# Patient Record
Sex: Male | Born: 1975 | Race: White | Hispanic: No | Marital: Single | Smoking: Current some day smoker
Health system: Southern US, Community
[De-identification: ages and names within clinical notes are randomized; demographics above are authoritative.]

## PROBLEM LIST (undated history)

## (undated) ENCOUNTER — Ambulatory Visit (HOSPITAL_COMMUNITY): Admission: EM | Payer: BLUE CROSS/BLUE SHIELD | Source: Home / Self Care

## (undated) DIAGNOSIS — F32A Depression, unspecified: Secondary | ICD-10-CM

## (undated) DIAGNOSIS — G459 Transient cerebral ischemic attack, unspecified: Secondary | ICD-10-CM

## (undated) DIAGNOSIS — G454 Transient global amnesia: Secondary | ICD-10-CM

## (undated) DIAGNOSIS — F419 Anxiety disorder, unspecified: Secondary | ICD-10-CM

## (undated) DIAGNOSIS — F410 Panic disorder [episodic paroxysmal anxiety] without agoraphobia: Secondary | ICD-10-CM

---

## 2005-03-27 ENCOUNTER — Emergency Department (HOSPITAL_COMMUNITY): Admission: EM | Admit: 2005-03-27 | Discharge: 2005-03-27 | Payer: Self-pay | Admitting: Emergency Medicine

## 2005-06-19 ENCOUNTER — Emergency Department (HOSPITAL_COMMUNITY): Admission: EM | Admit: 2005-06-19 | Discharge: 2005-06-19 | Payer: Self-pay | Admitting: Emergency Medicine

## 2005-08-23 ENCOUNTER — Emergency Department (HOSPITAL_COMMUNITY): Admission: EM | Admit: 2005-08-23 | Discharge: 2005-08-23 | Payer: Self-pay | Admitting: Emergency Medicine

## 2005-10-22 ENCOUNTER — Emergency Department (HOSPITAL_COMMUNITY): Admission: EM | Admit: 2005-10-22 | Discharge: 2005-10-22 | Payer: Self-pay | Admitting: Emergency Medicine

## 2005-11-09 ENCOUNTER — Emergency Department (HOSPITAL_COMMUNITY): Admission: EM | Admit: 2005-11-09 | Discharge: 2005-11-09 | Payer: Self-pay | Admitting: Emergency Medicine

## 2006-09-16 ENCOUNTER — Emergency Department (HOSPITAL_COMMUNITY): Admission: EM | Admit: 2006-09-16 | Discharge: 2006-09-16 | Payer: Self-pay | Admitting: Emergency Medicine

## 2013-09-17 ENCOUNTER — Encounter (HOSPITAL_COMMUNITY): Payer: Self-pay | Admitting: Emergency Medicine

## 2013-09-17 ENCOUNTER — Emergency Department (HOSPITAL_COMMUNITY)
Admission: EM | Admit: 2013-09-17 | Discharge: 2013-09-17 | Disposition: A | Discharge status: Home / Self Care | Payer: Self-pay | Attending: Emergency Medicine | Admitting: Emergency Medicine

## 2013-09-17 DIAGNOSIS — N342 Other urethritis: Secondary | ICD-10-CM | POA: Insufficient documentation

## 2013-09-17 DIAGNOSIS — Z79899 Other long term (current) drug therapy: Secondary | ICD-10-CM | POA: Insufficient documentation

## 2013-09-17 DIAGNOSIS — F17200 Nicotine dependence, unspecified, uncomplicated: Secondary | ICD-10-CM | POA: Insufficient documentation

## 2013-09-17 DIAGNOSIS — R369 Urethral discharge, unspecified: Secondary | ICD-10-CM | POA: Insufficient documentation

## 2013-09-17 DIAGNOSIS — Z8673 Personal history of transient ischemic attack (TIA), and cerebral infarction without residual deficits: Secondary | ICD-10-CM | POA: Insufficient documentation

## 2013-09-17 DIAGNOSIS — F411 Generalized anxiety disorder: Secondary | ICD-10-CM | POA: Insufficient documentation

## 2013-09-17 HISTORY — DX: Anxiety disorder, unspecified: F41.9

## 2013-09-17 HISTORY — DX: Transient cerebral ischemic attack, unspecified: G45.9

## 2013-09-17 LAB — URINE MICROSCOPIC-ADD ON

## 2013-09-17 LAB — URINALYSIS, ROUTINE W REFLEX MICROSCOPIC
BILIRUBIN URINE: NEGATIVE
GLUCOSE, UA: NEGATIVE mg/dL
Hgb urine dipstick: NEGATIVE
KETONES UR: NEGATIVE mg/dL
Nitrite: NEGATIVE
PROTEIN: NEGATIVE mg/dL
Specific Gravity, Urine: 1.006 (ref 1.005–1.030)
Urobilinogen, UA: 0.2 mg/dL (ref 0.0–1.0)
pH: 6 (ref 5.0–8.0)

## 2013-09-17 MED ORDER — CEFTRIAXONE SODIUM 250 MG IJ SOLR
250.0000 mg | Freq: Once | INTRAMUSCULAR | Status: AC
  Administered 2013-09-17: 250 mg via INTRAMUSCULAR
  Filled 2013-09-17: qty 250

## 2013-09-17 MED ORDER — CIPROFLOXACIN HCL 500 MG PO TABS: 500.0000 mg | ORAL_TABLET | Freq: Two times a day (BID) | ORAL | Status: DC

## 2013-09-17 MED ORDER — STERILE WATER FOR INJECTION IJ SOLN
INTRAMUSCULAR | Status: AC
  Administered 2013-09-17: 10 mL
  Filled 2013-09-17: qty 10

## 2013-09-17 MED ORDER — AZITHROMYCIN 250 MG PO TABS
1000.0000 mg | ORAL_TABLET | Freq: Once | ORAL | Status: AC
  Administered 2013-09-17: 1000 mg via ORAL
  Filled 2013-09-17: qty 4

## 2013-09-17 MED ORDER — METRONIDAZOLE 500 MG PO TABS
2000.0000 mg | ORAL_TABLET | Freq: Once | ORAL | Status: AC
  Administered 2013-09-17: 2000 mg via ORAL
  Filled 2013-09-17: qty 4

## 2013-09-17 NOTE — ED Provider Notes (Signed)
CSN: [id number]     Arrival date & time 09/17/13  [id number] History   First MD Initiated Contact with Patient 09/17/13 0913     Chief Complaint  Patient presents with  . Penile Discharge     (Consider location/radiation/quality/duration/timing/severity/associated sxs/prior Treatment) HPI Michael [name] is a 38 y.o. male who presents to emergency department complaining of a yellow penile discharge. Patient states he has had discharge for approximately a week. He denies any associated pain. He denies any dysuria, abdominal pain, scrotal pain or swelling. He denies any fever, chills, malaise. He states he is married but separated and has had a new partner for the last 2 months. He denies any perineal lesions or sores. No treatment tried. Nothing makes symptoms better or worse. States uses condoms but not every time.   Past Medical History  Diagnosis Date  . TIA (transient ischemic attack)   . Anxiety    History reviewed. No pertinent past surgical history. History reviewed. No pertinent family history. History  Substance Use Topics  . Smoking status: Current Every Day Smoker -- 1.00 packs/day    Types: Cigarettes  . Smokeless tobacco: Not on file  . Alcohol Use: Yes     Comment: rarely    Review of Systems  Constitutional: Negative for fever and chills.  Respiratory: Negative for cough, chest tightness and shortness of breath.   Cardiovascular: Negative for chest pain, palpitations and leg swelling.  Gastrointestinal: Negative for nausea, vomiting, abdominal pain, diarrhea and abdominal distention.  Genitourinary: Positive for discharge. Negative for dysuria, frequency, hematuria, genital sores and penile pain.  Musculoskeletal: Negative for arthralgias, myalgias, neck pain and neck stiffness.  Skin: Negative for rash.  Allergic/Immunologic: Negative for immunocompromised state.  Neurological: Negative for dizziness, weakness, light-headedness, numbness and headaches.       Allergies  Review of patient's allergies indicates no known allergies.  Home Medications   Prior to Admission medications   Medication Sig Start Date End Date Taking? Authorizing Provider  ALPRAZolam Prudy Feeler(XANAX) 1 MG tablet Take 1 mg by mouth 6 (six) times daily.   Yes Historical Provider, MD  Aspirin-Acetaminophen-Caffeine (EXCEDRIN PO) Take 4 tablets by mouth 4 (four) times daily as needed (for pain).   Yes Historical Provider, MD  Desvenlafaxine ER (KHEDEZLA) 50 MG TB24 Take 50 mg by mouth at bedtime.   Yes Historical Provider, MD  imipramine (TOFRANIL) 50 MG tablet Take 50 mg by mouth 3 (three) times daily.   Yes Historical Provider, MD   BP 127/83  Pulse 117  Temp(Src) 98.4 F (36.9 C) (Oral)  Resp 18  Ht 6\' 2"  (1.88 m)  Wt 210 lb (95.255 kg)  BMI 26.95 kg/m2  SpO2 100% Physical Exam  Nursing note and vitals reviewed. Constitutional: He is oriented to person, place, and time. He appears well-developed and well-nourished. No distress.  HENT:  Head: Normocephalic and atraumatic.  Eyes: Conjunctivae are normal.  Neck: Neck supple.  Cardiovascular: Normal rate, regular rhythm and normal heart sounds.   Pulmonary/Chest: Effort normal. No respiratory distress. He has no wheezes. He has no rales.  Abdominal: Soft. Bowel sounds are normal. He exhibits no distension. There is no tenderness. There is no rebound.  Genitourinary: Testes normal. Circumcised. No penile tenderness. Discharge found.  Yellow penile discharge present  Musculoskeletal: He exhibits no edema.  Neurological: He is alert and oriented to person, place, and time.  Skin: Skin is warm and dry.    ED Course  Procedures (including critical care time) Labs Review  Labs Reviewed  URINALYSIS, ROUTINE W REFLEX MICROSCOPIC - Abnormal; Notable for the following:    APPearance CLOUDY (*)    Leukocytes, UA LARGE (*)    All other components within normal limits  GC/CHLAMYDIA PROBE AMP  URINE MICROSCOPIC-ADD ON     Imaging Review No results found.   EKG Interpretation None      MDM   Final diagnoses:  Urethritis    Yellow penile discharge present on exam. New sexual partner. Treated in ED for what sounds like most likely STI, covered with 250mg  rocephin IM, zithromax 1g PO, flagyl 2g PO. Home with cipro, UA infected. Follow up with pcp. Explained cultures are pending.   Filed Vitals:   09/17/13 0907  BP: 127/83  Pulse: 117  Temp: 98.4 F (36.9 C)  TempSrc: Oral  Resp: 18  Height: 6\' 2"  (1.88 m)  Weight: 210 lb (95.255 kg)  SpO2: 100%       Abbigaile Rockman A Jaskirat Schwieger, PA-C 09/17/13 1038

## 2013-09-17 NOTE — Discharge Instructions (Signed)
cipro as prescribed until all gone. No intercourse for 5 days. When cultures return, if they are abnormal, you will be called. Follow up with your doctor as needed.    Urethritis Urethritis is an inflammation of the tube through which urine exits your bladder (urethra).  CAUSES Urethritis is often caused by an infection in your urethra. The infection can be viral, like herpes. The infection can also be bacterial, like gonorrhea. RISK FACTORS Risk factors of urethritis include:  Having sex without using a condom.  Having multiple sexual partners.  Having poor hygiene. SIGNS AND SYMPTOMS Symptoms of urethritis are less noticeable in women than in men. These symptoms include:  Burning feeling when you urinate (dysuria).  Discharge from your urethra.  Blood in your urine (hematuria).  Urinating more than usual. DIAGNOSIS  To confirm a diagnosis of urethritis, your health care provider will do the following:  Ask about your sexual history.  Perform a physical exam.  Have you provide a sample of your urine for lab testing.  Use a cotton swab to gently collect a sample from your urethra for lab testing. TREATMENT  It is important to treat urethritis. Depending on the cause, untreated urethritis may lead to serious genital infections and possibly infertility. Urethritis caused by a bacterial infection is treated with antibiotic medicine. All sexual partners must be treated.  HOME CARE INSTRUCTIONS  Do not have sex until the test results are known and treatment is completed, even if your symptoms go away before you finish treatment.  If you were prescribed an antibiotic, finish it all even if you start to feel better. SEEK MEDICAL CARE IF:   Your symptoms are not improved in 3 days.  Your symptoms are getting worse.  You develop abdominal pain or pelvic pain (in women).  You develop joint pain.  You have a fever. SEEK IMMEDIATE MEDICAL CARE IF:   You have severe pain in  the belly, back, or side.  You have repeated vomiting. MAKE SURE YOU:  Understand these instructions.  Will watch your condition.  Will get help right away if you are not doing well or get worse. Document Released: 07/21/2000 Document Revised: 06/11/2013 Document Reviewed: 09/25/2012 [facility] Patient Information 2015 [place], [place]. This information is not intended to replace advice given to you by your health care provider. Make sure you discuss any questions you have with your health care provider.

## 2013-09-17 NOTE — ED Provider Notes (Signed)
Medical screening examination/treatment/procedure(s) were performed by non-physician practitioner and as supervising physician I was immediately available for consultation/collaboration.   EKG Interpretation None       Yvonne Petite N Sefora Tietje, DO 09/17/13 1558 

## 2013-09-17 NOTE — ED Notes (Signed)
Pt with penile discharge for 1 week.  Pt is married.  Does not use condoms.

## 2013-09-18 NOTE — ED Notes (Signed)
Pharmacy needed provider verification of script written

## 2013-09-19 LAB — GC/CHLAMYDIA PROBE AMP
CT Probe RNA: NEGATIVE
GC Probe RNA: NEGATIVE

## 2014-02-20 ENCOUNTER — Encounter (HOSPITAL_COMMUNITY): Payer: Self-pay | Admitting: Emergency Medicine

## 2014-02-20 ENCOUNTER — Emergency Department (HOSPITAL_COMMUNITY)
Admission: EM | Admit: 2014-02-20 | Discharge: 2014-02-20 | Disposition: A | Discharge status: Home / Self Care | Payer: Self-pay | Attending: Emergency Medicine | Admitting: Emergency Medicine

## 2014-02-20 DIAGNOSIS — Y9389 Activity, other specified: Secondary | ICD-10-CM | POA: Insufficient documentation

## 2014-02-20 DIAGNOSIS — Y9289 Other specified places as the place of occurrence of the external cause: Secondary | ICD-10-CM | POA: Insufficient documentation

## 2014-02-20 DIAGNOSIS — G43809 Other migraine, not intractable, without status migrainosus: Secondary | ICD-10-CM | POA: Insufficient documentation

## 2014-02-20 DIAGNOSIS — F419 Anxiety disorder, unspecified: Secondary | ICD-10-CM | POA: Insufficient documentation

## 2014-02-20 DIAGNOSIS — S199XXA Unspecified injury of neck, initial encounter: Secondary | ICD-10-CM | POA: Insufficient documentation

## 2014-02-20 DIAGNOSIS — Z7982 Long term (current) use of aspirin: Secondary | ICD-10-CM | POA: Insufficient documentation

## 2014-02-20 DIAGNOSIS — S46912A Strain of unspecified muscle, fascia and tendon at shoulder and upper arm level, left arm, initial encounter: Secondary | ICD-10-CM | POA: Insufficient documentation

## 2014-02-20 DIAGNOSIS — Z720 Tobacco use: Secondary | ICD-10-CM | POA: Insufficient documentation

## 2014-02-20 DIAGNOSIS — X58XXXA Exposure to other specified factors, initial encounter: Secondary | ICD-10-CM | POA: Insufficient documentation

## 2014-02-20 DIAGNOSIS — Z79899 Other long term (current) drug therapy: Secondary | ICD-10-CM | POA: Insufficient documentation

## 2014-02-20 DIAGNOSIS — Y998 Other external cause status: Secondary | ICD-10-CM | POA: Insufficient documentation

## 2014-02-20 DIAGNOSIS — Z8673 Personal history of transient ischemic attack (TIA), and cerebral infarction without residual deficits: Secondary | ICD-10-CM | POA: Insufficient documentation

## 2014-02-20 MED ORDER — CYCLOBENZAPRINE HCL 10 MG PO TABS: 10.0000 mg | ORAL_TABLET | Freq: Two times a day (BID) | ORAL | Status: DC | PRN

## 2014-02-20 MED ORDER — NAPROXEN 375 MG PO TABS: 375.0000 mg | ORAL_TABLET | Freq: Two times a day (BID) | ORAL | Status: DC

## 2014-02-20 MED ORDER — HYDROCODONE-ACETAMINOPHEN 5-325 MG PO TABS: 1.0000 | ORAL_TABLET | ORAL | Status: DC | PRN

## 2014-02-20 MED ORDER — DIPHENHYDRAMINE HCL 50 MG/ML IJ SOLN
12.5000 mg | Freq: Once | INTRAMUSCULAR | Status: AC
  Administered 2014-02-20: 12.5 mg via INTRAVENOUS
  Filled 2014-02-20: qty 1

## 2014-02-20 MED ORDER — SODIUM CHLORIDE 0.9 % IV BOLUS (SEPSIS)
1000.0000 mL | Freq: Once | INTRAVENOUS | Status: AC
  Administered 2014-02-20: 1000 mL via INTRAVENOUS

## 2014-02-20 MED ORDER — KETOROLAC TROMETHAMINE 30 MG/ML IJ SOLN
30.0000 mg | Freq: Once | INTRAMUSCULAR | Status: AC
  Administered 2014-02-20: 30 mg via INTRAVENOUS
  Filled 2014-02-20: qty 1

## 2014-02-20 MED ORDER — METOCLOPRAMIDE HCL 5 MG/ML IJ SOLN
10.0000 mg | Freq: Once | INTRAMUSCULAR | Status: AC
  Administered 2014-02-20: 10 mg via INTRAVENOUS
  Filled 2014-02-20: qty 2

## 2014-02-20 MED ORDER — KETOROLAC TROMETHAMINE 10 MG PO TABS
10.0000 mg | ORAL_TABLET | Freq: Once | ORAL | Status: DC
  Filled 2014-02-20: qty 1

## 2014-02-20 MED ORDER — DEXAMETHASONE SODIUM PHOSPHATE 10 MG/ML IJ SOLN
10.0000 mg | Freq: Once | INTRAMUSCULAR | Status: AC
  Administered 2014-02-20: 10 mg via INTRAVENOUS
  Filled 2014-02-20: qty 1

## 2014-02-20 NOTE — ED Notes (Signed)
Pt c/o neck and left shoulder pain, had injury about 2.5 weeks ago, has not gotten better. States that it is causing his migraine to worsen. Pt c/o n/v with migraines.

## 2014-02-20 NOTE — Discharge Instructions (Signed)
Muscle Strain A muscle strain is an injury that occurs when a muscle is stretched beyond its normal length. Usually a small number of muscle fibers are torn when this happens. Muscle strain is rated in degrees. First-degree strains have the least amount of muscle fiber tearing and pain. Second-degree and third-degree strains have increasingly more tearing and pain.  Usually, recovery from muscle strain takes 1-2 weeks. Complete healing takes 5-6 weeks.  CAUSES  Muscle strain happens when a sudden, violent force placed on a muscle stretches it too far. This may occur with lifting, sports, or a fall.  RISK FACTORS Muscle strain is especially common in athletes.  SIGNS AND SYMPTOMS At the site of the muscle strain, there may be:  Pain.  Bruising.  Swelling.  Difficulty using the muscle due to pain or lack of normal function. DIAGNOSIS  Your health care provider will perform a physical exam and ask about your medical history. TREATMENT  Often, the best treatment for a muscle strain is resting, icing, and applying cold compresses to the injured area.  HOME CARE INSTRUCTIONS   Use the PRICE method of treatment to promote muscle healing during the first 2-3 days after your injury. The PRICE method involves:  Protecting the muscle from being injured again.  Restricting your activity and resting the injured body part.  Icing your injury. To do this, put ice in a plastic bag. Place a towel between your skin and the bag. Then, apply the ice and leave it on from 15-20 minutes each hour. After the third day, switch to moist heat packs.  Apply compression to the injured area with a splint or elastic bandage. Be careful not to wrap it too tightly. This may interfere with blood circulation or increase swelling.  Elevate the injured body part above the level of your heart as often as you can.  Only take over-the-counter or prescription medicines for pain, discomfort, or fever as directed by your  health care provider.  Warming up prior to exercise helps to prevent future muscle strains. SEEK MEDICAL CARE IF:   You have increasing pain or swelling in the injured area.  You have numbness, tingling, or a significant loss of strength in the injured area. MAKE SURE YOU:   Understand these instructions.  Will watch your condition.  Will get help right away if you are not doing well or get worse. Document Released: 01/25/2005 Document Revised: 11/15/2012 Document Reviewed: 08/24/2012 [facility] Patient Information 2015 [place], [place]. This information is not intended to replace advice given to you by your health care provider. Make sure you discuss any questions you have with your health care provider.  Migraine Headache A migraine headache is an intense, throbbing pain on one or both sides of your head. A migraine can last for 30 minutes to several hours. CAUSES  The exact cause of a migraine headache is not always known. However, a migraine may be caused when nerves in the brain become irritated and release chemicals that cause inflammation. This causes pain. Certain things may also trigger migraines, such as:  Alcohol.  Smoking.  Stress.  Menstruation.  Aged cheeses.  Foods or drinks that contain nitrates, glutamate, aspartame, or tyramine.  Lack of sleep.  Chocolate.  Caffeine.  Hunger.  Physical exertion.  Fatigue.  Medicines used to treat chest pain (nitroglycerine), birth control pills, estrogen, and some blood pressure medicines. SIGNS AND SYMPTOMS  Pain on one or both sides of your head.  Pulsating or throbbing pain.  Severe  pain that prevents daily activities.  Pain that is aggravated by any physical activity.  Nausea, vomiting, or both.  Dizziness.  Pain with exposure to bright lights, loud noises, or activity.  General sensitivity to bright lights, loud noises, or smells. Before you get a migraine, you may get warning signs that a migraine  is coming (aura). An aura may include:  Seeing flashing lights.  Seeing bright spots, halos, or zigzag lines.  Having tunnel vision or blurred vision.  Having feelings of numbness or tingling.  Having trouble talking.  Having muscle weakness. DIAGNOSIS  A migraine headache is often diagnosed based on:  Symptoms.  Physical exam.  A CT scan or MRI of your head. These imaging tests cannot diagnose migraines, but they can help rule out other causes of headaches. TREATMENT Medicines may be given for pain and nausea. Medicines can also be given to help prevent recurrent migraines.  HOME CARE INSTRUCTIONS  Only take over-the-counter or prescription medicines for pain or discomfort as directed by your health care provider. The use of long-term narcotics is not recommended.  Lie down in a dark, quiet room when you have a migraine.  Keep a journal to find out what may trigger your migraine headaches. For example, write down:  What you eat and drink.  How much sleep you get.  Any change to your diet or medicines.  Limit alcohol consumption.  Quit smoking if you smoke.  Get 7-9 hours of sleep, or as recommended by your health care provider.  Limit stress.  Keep lights dim if bright lights bother you and make your migraines worse. SEEK IMMEDIATE MEDICAL CARE IF:   Your migraine becomes severe.  You have a fever.  You have a stiff neck.  You have vision loss.  You have muscular weakness or loss of muscle control.  You start losing your balance or have trouble walking.  You feel faint or pass out.  You have severe symptoms that are different from your first symptoms. MAKE SURE YOU:   Understand these instructions.  Will watch your condition.  Will get help right away if you are not doing well or get worse. Document Released: 01/25/2005 Document Revised: 06/11/2013 Document Reviewed: 10/02/2012 [facility] Patient Information 2015 [place], [place]. This information  is not intended to replace advice given to you by your health care provider. Make sure you discuss any questions you have with your health care provider.

## 2014-02-20 NOTE — ED Provider Notes (Signed)
CSN: [id number]     Arrival date & time 02/20/14  1554 History   First MD Initiated Contact with Patient 02/20/14 1812     Chief Complaint  Patient presents with  . Shoulder Pain  . Neck Pain  . Migraine     (Consider location/radiation/quality/duration/timing/severity/associated sxs/prior Treatment) HPI    PCP: No primary care provider on file. Blood pressure 135/88, pulse 118, temperature 98.4 F (36.9 C), temperature source Oral, resp. rate 20, SpO2 100 %.  Illa LevelJohn R Guercio is a 39 y.o.male with a significant PMH of anxiety and TIA presents to the ER with complaints of migraine and neck/shoulder injury. Two half weeks ago he was raising a pallet when he felt the muscle in his left shoulder pool. Since then he has been taking oral ibuprofen and Excedrin for his symptoms. These medications help but his shoulder pain comes back afterwards. He has history of daily migraines for which she normally sees a neurologist and has not been to see the neurologist months due to insurance problems. He reports the migrainous starting at the base of the skull and radiating up towards his frontal scalp. He's not had any nausea, vomiting, diarrhea, global or focal weakness and he denies change of vision.    Past Medical History  Diagnosis Date  . TIA (transient ischemic attack)   . Anxiety    History reviewed. No pertinent past surgical history. No family history on file. History  Substance Use Topics  . Smoking status: Current Every Day Smoker -- 1.00 packs/day    Types: Cigarettes  . Smokeless tobacco: Not on file  . Alcohol Use: Yes     Comment: rarely    Review of Systems  10 Systems reviewed and are negative for acute change except as noted in the HPI.    Allergies  Review of patient's allergies indicates no known allergies.  Home Medications   Prior to Admission medications   Medication Sig Start Date End Date Taking? Authorizing Provider  ALPRAZolam Prudy Feeler(XANAX) 1 MG tablet Take 1  mg by mouth 3 (three) times daily as needed for anxiety (anxiety).    Yes Historical Provider, MD  Aspirin-Acetaminophen-Caffeine (PAIN RELIEVER PLUS PO) Take 4 tablets by mouth daily as needed (head & shoulder pain).   Yes Historical Provider, MD  ibuprofen (ADVIL,MOTRIN) 200 MG tablet Take 800 mg by mouth every 6 (six) hours as needed for moderate pain (head & shoulder pain).   Yes Historical Provider, MD  ciprofloxacin (CIPRO) 500 MG tablet Take 1 tablet (500 mg total) by mouth 2 (two) times daily. Patient not taking: Reported on 02/20/2014 09/17/13   Tatyana A Kirichenko, PA-C  cyclobenzaprine (FLEXERIL) 10 MG tablet Take 1 tablet (10 mg total) by mouth 2 (two) times daily as needed for muscle spasms. 02/20/14   Aden Youngman Irine SealG Jameil Whitmoyer, PA-C  HYDROcodone-acetaminophen (NORCO/VICODIN) 5-325 MG per tablet Take 1-2 tablets by mouth every 4 (four) hours as needed for moderate pain or severe pain. 02/20/14   Yuval Nolet Irine SealG Irene Mitcham, PA-C  naproxen (NAPROSYN) 375 MG tablet Take 1 tablet (375 mg total) by mouth 2 (two) times daily. 02/20/14   Anayely Constantine Irine SealG Terre Zabriskie, PA-C   BP 135/88 mmHg  Pulse 118  Temp(Src) 98.4 F (36.9 C) (Oral)  Resp 20  SpO2 100% Physical Exam  Constitutional: He appears well-developed and well-nourished. No distress.  HENT:  Head: Normocephalic and atraumatic.  Eyes: Pupils are equal, round, and reactive to light.  Neck: Normal range of motion. Neck supple.  Cardiovascular: Normal  rate and regular rhythm.   Pulmonary/Chest: Effort normal.  Abdominal: Soft.  Musculoskeletal:       Left shoulder: He exhibits tenderness, pain and spasm. He exhibits normal range of motion, no bony tenderness, no swelling, no effusion, no crepitus, no deformity, no laceration, normal pulse and normal strength.       Arms: Neurological: He is alert.  Cranial nerves II-VIII and X-XII evaluated and show no deficits. Pt alert and oriented x 3 Upper and lower extremity strength is symmetrical and physiologic Normal  muscular tone No facial droop Coordination intact, no limb ataxia, finger-nose-finger normal Rapid alternating movements normal No pronator drift  Skin: Skin is warm and dry.  Nursing note and vitals reviewed.   ED Course  Procedures (including critical care time) Labs Review Labs Reviewed - No data to display  Imaging Review No results found.   EKG Interpretation None      MDM   Final diagnoses:  Other migraine without status migrainosus, not intractable  Shoulder strain, left, initial encounter    Medications  ketorolac (TORADOL) 30 MG/ML injection 30 mg (not administered)  dexamethasone (DECADRON) injection 10 mg (10 mg Intravenous Given 02/20/14 1856)  sodium chloride 0.9 % bolus 1,000 mL (0 mLs Intravenous Stopped 02/20/14 1931)  metoCLOPramide (REGLAN) injection 10 mg (10 mg Intravenous Given 02/20/14 1855)  diphenhydrAMINE (BENADRYL) injection 12.5 mg (12.5 mg Intravenous Given 02/20/14 1904)    Patients migraine and shoulder pain has improved to 1-2/10.  The patient denies any symptoms of neurological impairment or TIA's; no amaurosis, diplopia, dysphasia, or unilateral disturbance of motor or sensory function. No loss of balance or vertigo.  Rx: for shoulder strain: muscle relaxer, Nsaids, Vicodin (x 12)  38 y.o.Aram Candela Bircher's evaluation in the Emergency Department is complete. It has been determined that no acute conditions requiring further emergency intervention are present at this time. The patient/guardian have been advised of the diagnosis and plan. We have discussed signs and symptoms that warrant return to the ED, such as changes or worsening in symptoms.  Vital signs are stable at discharge. Filed Vitals:   02/20/14 1558  BP: 135/88  Pulse: 118  Temp: 98.4 F (36.9 C)  Resp: 20    Patient/guardian has voiced understanding and agreed to follow-up with the PCP or specialist.    Dorthula Matas, PA-C 02/20/14 2004  Tilden Fossa,  MD 02/20/14 2008

## 2014-03-11 ENCOUNTER — Encounter (HOSPITAL_COMMUNITY): Payer: Self-pay | Admitting: Emergency Medicine

## 2014-03-11 ENCOUNTER — Emergency Department (HOSPITAL_COMMUNITY): Payer: Self-pay

## 2014-03-11 ENCOUNTER — Emergency Department (HOSPITAL_COMMUNITY)
Admission: EM | Admit: 2014-03-11 | Discharge: 2014-03-11 | Disposition: A | Discharge status: Home / Self Care | Payer: Self-pay | Attending: Emergency Medicine | Admitting: Emergency Medicine

## 2014-03-11 DIAGNOSIS — Z8673 Personal history of transient ischemic attack (TIA), and cerebral infarction without residual deficits: Secondary | ICD-10-CM | POA: Insufficient documentation

## 2014-03-11 DIAGNOSIS — F419 Anxiety disorder, unspecified: Secondary | ICD-10-CM | POA: Insufficient documentation

## 2014-03-11 DIAGNOSIS — Z720 Tobacco use: Secondary | ICD-10-CM | POA: Insufficient documentation

## 2014-03-11 DIAGNOSIS — Z79899 Other long term (current) drug therapy: Secondary | ICD-10-CM | POA: Insufficient documentation

## 2014-03-11 DIAGNOSIS — Z791 Long term (current) use of non-steroidal anti-inflammatories (NSAID): Secondary | ICD-10-CM | POA: Insufficient documentation

## 2014-03-11 DIAGNOSIS — H539 Unspecified visual disturbance: Secondary | ICD-10-CM | POA: Insufficient documentation

## 2014-03-11 DIAGNOSIS — Z792 Long term (current) use of antibiotics: Secondary | ICD-10-CM | POA: Insufficient documentation

## 2014-03-11 DIAGNOSIS — R200 Anesthesia of skin: Secondary | ICD-10-CM | POA: Insufficient documentation

## 2014-03-11 DIAGNOSIS — G43909 Migraine, unspecified, not intractable, without status migrainosus: Secondary | ICD-10-CM | POA: Insufficient documentation

## 2014-03-11 LAB — CBC
HCT: 39 % (ref 39.0–52.0)
HEMOGLOBIN: 12.9 g/dL — AB (ref 13.0–17.0)
MCH: 31.5 pg (ref 26.0–34.0)
MCHC: 33.1 g/dL (ref 30.0–36.0)
MCV: 95.1 fL (ref 78.0–100.0)
PLATELETS: 298 10*3/uL (ref 150–400)
RBC: 4.1 MIL/uL — ABNORMAL LOW (ref 4.22–5.81)
RDW: 12.9 % (ref 11.5–15.5)
WBC: 10.6 10*3/uL — ABNORMAL HIGH (ref 4.0–10.5)

## 2014-03-11 LAB — COMPREHENSIVE METABOLIC PANEL
ALBUMIN: 4.4 g/dL (ref 3.5–5.2)
ALT: 11 U/L (ref 0–53)
AST: 15 U/L (ref 0–37)
Alkaline Phosphatase: 59 U/L (ref 39–117)
Anion gap: 7 (ref 5–15)
BILIRUBIN TOTAL: 0.6 mg/dL (ref 0.3–1.2)
BUN: 16 mg/dL (ref 6–23)
CALCIUM: 8.9 mg/dL (ref 8.4–10.5)
CO2: 24 mmol/L (ref 19–32)
CREATININE: 0.95 mg/dL (ref 0.50–1.35)
Chloride: 106 mmol/L (ref 96–112)
GFR calc Af Amer: >90 mL/min (ref >90)
GFR calc non Af Amer: >90 mL/min (ref >90)
GLUCOSE: 99 mg/dL (ref 70–99)
POTASSIUM: 3.4 mmol/L — AB (ref 3.5–5.1)
Sodium: 137 mmol/L (ref 135–145)
Total Protein: 7.5 g/dL (ref 6.0–8.3)

## 2014-03-11 LAB — PROTIME-INR
INR: 0.95 (ref 0.00–1.49)
Prothrombin Time: 12.8 seconds (ref 11.6–15.2)

## 2014-03-11 LAB — DIFFERENTIAL
BASOS PCT: 0 % (ref 0–1)
Basophils Absolute: 0 10*3/uL (ref 0.0–0.1)
EOS PCT: 2 % (ref 0–5)
Eosinophils Absolute: 0.2 10*3/uL (ref 0.0–0.7)
LYMPHS ABS: 2.5 10*3/uL (ref 0.7–4.0)
Lymphocytes Relative: 24 % (ref 12–46)
MONOS PCT: 9 % (ref 3–12)
Monocytes Absolute: 0.9 10*3/uL (ref 0.1–1.0)
Neutro Abs: 7 10*3/uL (ref 1.7–7.7)
Neutrophils Relative %: 65 % (ref 43–77)

## 2014-03-11 LAB — I-STAT CHEM 8, ED
BUN: 17 mg/dL (ref 6–23)
Calcium, Ion: 1.08 mmol/L — ABNORMAL LOW (ref 1.12–1.23)
Chloride: 104 mmol/L (ref 96–112)
Creatinine, Ser: 1 mg/dL (ref 0.50–1.35)
GLUCOSE: 95 mg/dL (ref 70–99)
HEMATOCRIT: 42 % (ref 39.0–52.0)
Hemoglobin: 14.3 g/dL (ref 13.0–17.0)
POTASSIUM: 3.4 mmol/L — AB (ref 3.5–5.1)
Sodium: 139 mmol/L (ref 135–145)
TCO2: 22 mmol/L (ref 0–100)

## 2014-03-11 LAB — I-STAT TROPONIN, ED: Troponin i, poc: 0 ng/mL (ref 0.00–0.08)

## 2014-03-11 LAB — APTT: APTT: 28 s (ref 24–37)

## 2014-03-11 NOTE — Discharge Instructions (Signed)
Take 1-325 mg aspirin daily until followed up by neurology.  Follow-up with neurology. [facility] this discharge summary. Call to arrange this appointment.  Return to the ER if your symptoms substantially worsen or change.   Visual Disturbances You have had a disturbance in your vision. This may be caused by various conditions, such as:  Migraines. Migraine headaches are often preceded by a disturbance in vision. Blind spots or light flashes are followed by a headache. This type of visual disturbance is temporary. It does not damage the eye.  Glaucoma. This is caused by increased pressure in the eye. Symptoms include haziness, blurred vision, or seeing rainbow colored circles when looking at bright lights. Partial or complete visual loss can occur. You may or may not experience eye pain. Visual loss may be gradual or sudden and is irreversible. Glaucoma is the leading cause of blindness.  Retina problems. Vision will be reduced if the retina becomes detached or if there is a circulation problem as with diabetes, high blood pressure, or a mini-stroke. Symptoms include seeing "floaters," flashes of light, or shadows, as if a curtain has fallen over your eye.  Optic nerve problems. The main nerve in your eye can be damaged by redness, soreness, and swelling (inflammation), poor circulation, drugs, and toxins. It is very important to have a complete exam done by a specialist to determine the exact cause of your eye problem. The specialist may recommend medicines or surgery, depending on the cause of the problem. This can help prevent further loss of vision or reduce the risk of having a stroke. Contact the caregiver to whom you have been referred and arrange for follow-up care right away. SEEK IMMEDIATE MEDICAL CARE IF:   Your vision gets worse.  You develop severe headaches.  You have any weakness or numbness in the face, arms, or  legs.  You have any trouble speaking or walking. Document Released: 03/04/2004 Document Revised: 04/19/2011 Document Reviewed: 06/25/2009 [facility] Patient Information 2015 [place], [place]. This information is not intended to replace advice given to you by your health care provider. Make sure you discuss any questions you have with your health care provider.

## 2014-03-11 NOTE — ED Notes (Signed)
Code stroke canceled per Dr Judd Lienelo...klj

## 2014-03-11 NOTE — ED Notes (Addendum)
Pt c/o left eye white vision and blindness with left arm tingling/numbness yesterday, states that symptoms resolved, pt states that he felt fine when he woke this morning. Pt states that 30 minutes ago he began to have white vision and partial blindness bilaterally, left pronator drift present on assessment, pt denies any other symptoms. A & O x 4. Delo, MD, aware of symptoms.

## 2014-03-11 NOTE — ED Provider Notes (Signed)
CSN: [id number]     Arrival date & time 03/11/14  1345 History   First MD Initiated Contact with Patient 03/11/14 1438     Chief Complaint  Patient presents with  . Code Stroke     (Consider location/radiation/quality/duration/timing/severity/associated sxs/prior Treatment) HPI Comments: Patient is a 39 year old male who presents with complaints of seeing white spots in his right eye yesterday evening. This resolved after several minutes, then recurred this morning in his left eye. Later this morning he developed numbness in his left arm and presents here for evaluation. Patient tells me he has a history of TIAs and migraines in his been followed by a neurologist in [place]. He has since lost his insurance and no longer takes the medications he was prescribed by him. Patient denies any headache. He denies any fevers or chills.  The history is provided by the patient.    Past Medical History  Diagnosis Date  . TIA (transient ischemic attack)   . Anxiety    History reviewed. No pertinent past surgical history. History reviewed. No pertinent family history. History  Substance Use Topics  . Smoking status: Current Every Day Smoker -- 1.00 packs/day    Types: Cigarettes  . Smokeless tobacco: Not on file  . Alcohol Use: Yes     Comment: rarely    Review of Systems  All other systems reviewed and are negative.     Allergies  Review of patient's allergies indicates no known allergies.  Home Medications   Prior to Admission medications   Medication Sig Start Date End Date Taking? Authorizing Provider  ALPRAZolam Prudy Feeler) 1 MG tablet Take 1 mg by mouth every 4 (four) hours as needed for anxiety (anxiety).    Yes Historical Provider, MD  aspirin-acetaminophen-caffeine (EXCEDRIN MIGRAINE) [phone] MG per tablet Take 2 tablets by mouth every 6 (six) hours as needed for headache.   Yes Historical Provider, MD  ibuprofen (ADVIL,MOTRIN) 200 MG tablet Take 800 mg by mouth every 6  (six) hours as needed for moderate pain (head & shoulder pain).   Yes Historical Provider, MD  naproxen (NAPROSYN) 250 MG tablet Take 750 mg by mouth 2 (two) times daily as needed for mild pain.   Yes Historical Provider, MD  naproxen sodium (ANAPROX) 220 MG tablet Take 440 mg by mouth 2 (two) times daily as needed (headaches).   Yes Historical Provider, MD  oxymetazoline (AFRIN) 0.05 % nasal spray Place 2 sprays into both nostrils daily as needed for congestion.   Yes Historical Provider, MD  ciprofloxacin (CIPRO) 500 MG tablet Take 1 tablet (500 mg total) by mouth 2 (two) times daily. Patient not taking: Reported on 02/20/2014 09/17/13   Tatyana A Kirichenko, PA-C  cyclobenzaprine (FLEXERIL) 10 MG tablet Take 1 tablet (10 mg total) by mouth 2 (two) times daily as needed for muscle spasms. Patient not taking: Reported on 03/11/2014 02/20/14   Dorthula Matas, PA-C  HYDROcodone-acetaminophen (NORCO/VICODIN) 5-325 MG per tablet Take 1-2 tablets by mouth every 4 (four) hours as needed for moderate pain or severe pain. Patient not taking: Reported on 03/11/2014 02/20/14   Dorthula Matas, PA-C  naproxen (NAPROSYN) 375 MG tablet Take 1 tablet (375 mg total) by mouth 2 (two) times daily. 02/20/14   Tiffany Irine Seal, PA-C   BP 115/86 mmHg  Pulse 79  Temp(Src) 98 F (36.7 C)  Resp 12  SpO2 99% Physical Exam  Constitutional: He is oriented to person, place, and time. He appears well-developed and well-nourished. No distress.  Awake, alert, nontoxic appearance.  HENT:  Head: Normocephalic and atraumatic.  Mouth/Throat: Oropharynx is clear and moist.  Eyes: EOM are normal. Pupils are equal, round, and reactive to light. Right eye exhibits no discharge. Left eye exhibits no discharge.  There is no papilledema on funduscopic exam.  Neck: Normal range of motion. Neck supple.  Cardiovascular: Normal rate, regular rhythm, normal heart sounds and intact distal pulses.   No murmur heard. Pulmonary/Chest: Effort  normal. No respiratory distress. He has no wheezes. He exhibits no tenderness.  Abdominal: Soft. Bowel sounds are normal. He exhibits no distension. There is no tenderness. There is no rebound.  Musculoskeletal: Normal range of motion. He exhibits no edema or tenderness.  Baseline ROM, no obvious new focal weakness.  Lymphadenopathy:    He has no cervical adenopathy.  Neurological: He is alert and oriented to person, place, and time. No cranial nerve deficit. He exhibits normal muscle tone. Coordination normal.  Mental status and motor strength appears baseline for patient and situation.  Skin: Skin is warm and dry. No rash noted. He is not diaphoretic.  Psychiatric: He has a normal mood and affect.  Nursing note and vitals reviewed.   ED Course  Procedures (including critical care time) Labs Review Labs Reviewed  CBC - Abnormal; Notable for the following:    WBC 10.6 (*)    RBC 4.10 (*)    Hemoglobin 12.9 (*)    All other components within normal limits  COMPREHENSIVE METABOLIC PANEL - Abnormal; Notable for the following:    Potassium 3.4 (*)    All other components within normal limits  I-STAT CHEM 8, ED - Abnormal; Notable for the following:    Potassium 3.4 (*)    Calcium, Ion 1.08 (*)    All other components within normal limits  PROTIME-INR  APTT  DIFFERENTIAL  CBG MONITORING, ED  I-STAT TROPOININ, ED    Imaging Review Ct Head (brain) Wo Contrast  03/11/2014   CLINICAL DATA:  Acute left-sided paresthesias, blurred vision, code stroke.  EXAM: CT HEAD WITHOUT CONTRAST  TECHNIQUE: Contiguous axial images were obtained from the base of the skull through the vertex without contrast.  COMPARISON:  None  FINDINGS: Normal appearance of the intracranial structures. No evidence for acute hemorrhage, mass lesion, midline shift, hydrocephalus or large infarct. No acute bony abnormality. The visualized sinuses are clear.  IMPRESSION: No acute intracranial abnormality.  These results  were called by telephone at the time of interpretation on 03/11/2014 at 2:23 pm to Dr. Judd Lienelo, who verbally acknowledged these results.   Electronically Signed   By: Ruel Favorsrevor  Shick M.D.   On: 03/11/2014 14:23     EKG Interpretation None      MDM   Final diagnoses:  None    Patient is a 39 year old male who presents with complaints of visual disturbances alternating between both eyes followed by left arm tingling and numbness this morning. He arrives here neurologically intact. Due to the nature of his symptoms, a code stroke was initiated revealing a normal head CT and laboratory studies. But he was evaluated, he remains neurologically intact with no focal deficits.  Patient describes a history of migraine headaches, long history of anxiety and panic attacks, and what he was told were TI[place] by [place] He has not seen this doctor in many months due to loss of insurance.  I highly doubt this was an embolic or ischemic event. His complaints alternated between both eyes and subjective  numbness in the left arm and workup is unremarkable. He will be discharged to home. He states he takes an 81 mg aspirin daily. I will advise him to take a full strength aspirin daily and follow-up with neurology.    Geoffery Lyons, MD 03/11/14 [phone]

## 2014-03-11 NOTE — ED Notes (Signed)
Patient in CT at this time.

## 2014-05-07 ENCOUNTER — Encounter (HOSPITAL_COMMUNITY): Payer: Self-pay | Admitting: *Deleted

## 2014-05-07 ENCOUNTER — Emergency Department (HOSPITAL_COMMUNITY)
Admission: EM | Admit: 2014-05-07 | Discharge: 2014-05-07 | Disposition: A | Discharge status: Home / Self Care | Payer: Self-pay | Attending: Emergency Medicine | Admitting: Emergency Medicine

## 2014-05-07 DIAGNOSIS — Z791 Long term (current) use of non-steroidal anti-inflammatories (NSAID): Secondary | ICD-10-CM | POA: Insufficient documentation

## 2014-05-07 DIAGNOSIS — R519 Headache, unspecified: Secondary | ICD-10-CM

## 2014-05-07 DIAGNOSIS — Z720 Tobacco use: Secondary | ICD-10-CM | POA: Insufficient documentation

## 2014-05-07 DIAGNOSIS — Z8673 Personal history of transient ischemic attack (TIA), and cerebral infarction without residual deficits: Secondary | ICD-10-CM | POA: Insufficient documentation

## 2014-05-07 DIAGNOSIS — Z792 Long term (current) use of antibiotics: Secondary | ICD-10-CM | POA: Insufficient documentation

## 2014-05-07 DIAGNOSIS — G43909 Migraine, unspecified, not intractable, without status migrainosus: Secondary | ICD-10-CM | POA: Insufficient documentation

## 2014-05-07 DIAGNOSIS — R51 Headache: Secondary | ICD-10-CM

## 2014-05-07 DIAGNOSIS — F419 Anxiety disorder, unspecified: Secondary | ICD-10-CM | POA: Insufficient documentation

## 2014-05-07 MED ORDER — KETOROLAC TROMETHAMINE 15 MG/ML IJ SOLN
15.0000 mg | Freq: Once | INTRAMUSCULAR | Status: AC
  Administered 2014-05-07: 15 mg via INTRAVENOUS
  Filled 2014-05-07: qty 1

## 2014-05-07 MED ORDER — DIPHENHYDRAMINE HCL 50 MG/ML IJ SOLN
25.0000 mg | Freq: Once | INTRAMUSCULAR | Status: AC
  Administered 2014-05-07: 25 mg via INTRAVENOUS
  Filled 2014-05-07: qty 1

## 2014-05-07 MED ORDER — SODIUM CHLORIDE 0.9 % IV BOLUS (SEPSIS)
1000.0000 mL | Freq: Once | INTRAVENOUS | Status: AC
  Administered 2014-05-07: 1000 mL via INTRAVENOUS

## 2014-05-07 MED ORDER — PROCHLORPERAZINE EDISYLATE 5 MG/ML IJ SOLN
10.0000 mg | Freq: Four times a day (QID) | INTRAMUSCULAR | Status: DC | PRN
  Administered 2014-05-07: 10 mg via INTRAVENOUS
  Filled 2014-05-07: qty 2

## 2014-05-07 NOTE — ED Notes (Signed)
IV flushed and converted to saline lock.

## 2014-05-07 NOTE — Discharge Instructions (Signed)
°Emergency Department Resource Guide °1) Find a Doctor and Pay Out of Pocket °Although you won't have to find out who is covered by your insurance plan, it is a good idea to ask around and get recommendations. You will then need to call the office and see if the doctor you have chosen will accept you as a new patient and what types of options they offer for patients who are self-pay. Some doctors offer discounts or will set up payment plans for their patients who do not have insurance, but you will need to ask so you aren't surprised when you get to your appointment. ° °2) Contact Your Local Health Department °Not all health departments have doctors that can see patients for sick visits, but many do, so it is worth a call to see if yours does. If you don't know where your local health department is, you can check in your phone book. The CDC also has a tool to help you locate your state's health department, and many state websites also have listings of all of their local health departments. ° °3) Find a Walk-in Clinic °If your illness is not likely to be very severe or complicated, you may want to try a walk in clinic. These are popping up all over the country in pharmacies, drugstores, and shopping centers. They're usually staffed by nurse practitioners or physician assistants that have been trained to treat common illnesses and complaints. They're usually fairly quick and inexpensive. However, if you have serious medical issues or chronic medical problems, these are probably not your best option. ° °No Primary Care Doctor: °- Call [facility] at  832-8000 - they can help you locate a primary care doctor that  accepts your insurance, provides certain services, etc. °- Physician Referral Service- 1-800-533-3463 ° °Chronic Pain Problems: °Organization         Address  Phone   Notes  °[facility]  (336) 297-2271 Patients need to be referred by their primary care doctor.  ° °Medication  Assistance: °Organization         Address  Phone   Notes  °[place] [facility] [address] °[place], [place] [address] (336) 641-8030 --Must be a resident of [place] °-- Must have NO insurance coverage whatsoever (no Medicaid/ Medicare, etc.) °-- The pt. MUST have a primary care doctor that directs their care regularly and follows them in the community °  °[facility]  (866) 331-1348   °[employer]  (888) 892-1162   ° °Agencies that provide inexpensive medical care: °Organization         Address  Phone   Notes  °[facility]  (336) 832-8035   °[facility]    (336) 832-7272   °[facility] [address] °[place], [place] [address] (336) 832-4777   °[facility] [address], °[place] (336) 271-4999   °[facility]    (336) 373-0678   °[facility]    (336) 272-1050   °[facility] ° [address], [place] Phone:  (336) 832-4444, Fax:  (336) 832-4440 Hours of Operation:  9 am - 6 pm, M-F.  Also accepts Medicaid/Medicare and self-pay.  °[facility] ° [address], [place] Phone: (336) [phone], Fax: (336) [phone]. Hours of Operation:  8:30 am - 5:30 pm, M-F.  Also accepts Medicaid and self-pay.  °[facility] [place] [address], [place] Phone: (336) 878-6027   °[facility] [address], [place], [place] (336)723-1848, Ext. 123 Mondays & Thursdays: 7-9 AM.  First 15 patients are seen on a first come, first serve basis. °  ° °Medicaid-accepting [place] Providers: ° °Organization         Address  Phone   Notes  °[facility] [address], [place] (336) 641-2100 Also accepts self-pay patients.  °[facility] [address], [place] ° (336) 856-9996   °[facility] [address], [place]  (336) 288-8857   °[facility] [address] [place] [address], [place] (336) 299-7000   °Veita Bland [address], [place]  ° (336) 373-1557 Only accepts [place] Access Medicaid patients after they have their name applied to their card.  ° °Self-Pay (no insurance) in [place]: ° °Organization         Address  Phone   Notes  °Sickle Cell Patients, [facility] [address], [place] (336) 832-1970   °[facility] [address], [place] (336) 832-4400   °[facility] ° [address], [place] (336) 992-4800   °[facility]/Dr. Osei-Bonsu ° [address] [place] [address], [place] or [address], [place] (336) [phone] Phone number for both [place] and [place] locations is the same.  °[facility] [address], [place] (336) 299-0000   °[facility] [address] [place] [address], [place] or [address] (336) 852-7530 °(336) 878-2260   °[facility] [address], [place] (336) 350-1642, phone; (336) 294-5005, fax Sees patients 1st and 3rd Saturday of every month.  Must not qualify for public or private insurance (i.e. Medicaid, Medicare, [place] Health Choice, Veterans' Benefits) • Household income should be no more than 200% of the poverty level •The clinic cannot treat you if you are pregnant or think you are pregnant • Sexually transmitted diseases are not treated at the clinic.  ° ° °Dental Care: °Organization         Address  Phone  Notes  °[place] [facility] [address], [place] (336) 641-6152 Accepts children up to age 21 who are enrolled in Medicaid or [place] Health Choice; pregnant women with a Medicaid card; and children who have applied for Medicaid or [place] Health Choice, but were declined, whose parents can pay a reduced fee at time of service.  °[place]  [facility] [place]  [address], [place] (336) 641-7733 Accepts children up to age 21 who are enrolled in Medicaid or [place] Health Choice; pregnant women with a Medicaid card; and children who have applied for Medicaid or [place] Health Choice, but were declined, whose parents can pay a reduced fee at time of service.  °[facility] ° [address], [place] (336) 641-4533 Patients are seen by appointment only. Walk-ins are not accepted. [facility] will see patients 18 years of age and older. °Monday - Tuesday (8am-5pm) °Most Wednesdays (8:30-5pm) °$30 per visit, cash only  °[facility] ° [address], [place] (336) 641-4533 Patients are seen by appointment only. Walk-ins are not accepted. [facility] will see patients 18 years of age and older. °One   Wednesday Evening (Monthly: Volunteer Based).  $30 per visit, cash only  °[facility]  (919) [phone] for adults; Children under age 4, call Graduate Pediatric Dentistry at ([phone]) [phone]. Children aged 4-14, please call ([phone]) [phone] to request a pediatric application. ° Dental services are provided in all areas of dental care including fillings, crowns and bridges, complete and partial dentures, implants, gum treatment, root canals, and extractions. Preventive care is also provided. Treatment is provided to both adults and children. °Patients are selected via a lottery and there is often a waiting list. °  °[facility] [address], °[place] ° (336) 763-8833 [email or web address] °  °[facility] [address], [place], [place] (336)723-1848, Ext. 123 Second and Fourth Thursday of each month, opens at 6:30 AM; Clinic ends at 9 AM.  Patients are seen on a first-come first-served basis, and a limited number are seen during each clinic.  ° °[facility] ° [address], [place], [place] (336) [phone]    Eligibility Requirements °You must have lived in [place], [place], or [place] counties for at least the last three months. °  You cannot be eligible for state or federal sponsored healthcare insurance, including Veterans Administration, Medicaid, or Medicare. °  You generally cannot be eligible for healthcare insurance through your employer.  °  How to apply: °Eligibility screenings are held every Tuesday and Wednesday afternoon from 1:00 pm until 4:00 pm. You do not need an appointment for the interview!  °[facility] [address], [place], [place] 336-631-2330   °[facility]  336-342-8273   °[facility]  336-703-3100   °[facility]  336-570-6415   ° °Behavioral Health Resources in the Community: °Intensive Outpatient Programs °Organization         Address  Phone  Notes  °[facility] [address], [place], [place] 336-878-6098   °[facility] [address], [place], [place] 336-832-9800   °[facility] [address], [place], [place] ° 336-882-2125   °[facility] [address],  °[place], [place] 1-800-853-5163 or 336-641-4981   °Substance Abuse Resources °Organization         Address  Phone  Notes  °[facility]  336-882-2125   °[facility]  336-784-9470   °[facility]  336-285-9073   °[facility]  336-845-3988   °Residential & Outpatient Substance Abuse Program  1-800-659-3381   °Psychological Services °Organization         Address  Phone  Notes  °[facility]  336- 832-9600   °[facility]  336- 378-7881   °[facility] [address], [place] 1-800-853-5163 or 336-641-4981   ° °Mobile Crisis Teams °Organization         Address  Phone  Notes  °[facility], Mobile Crisis Care Unit  1-877-626-1772   °Assertive °[facility] ° [address]  [place], [place] 336-834-9664   °Sharon DeEsch [address] °[place] 336-554-5454   ° °Self-Help/Support Groups °Organization         Address  Phone             Notes  °[facility]  - variety of support groups  336- 373-1402 Call for more information  °Narcotics Anonymous (NA), [facility] [address], °[place]  2 meetings at this location  ° °  Residential Treatment Programs Organization         Address  Phone  Notes  [facility] [address],    [place]  1-610-960-4540   [facility]  [address] [place] [address], [place], [place] [phone]   [facility] [address] [place], [place] [phone] Admissions: 8am-3pm M-F  [facility] [address],    [place], [place] 295-284-1324   [facility] [address] [place], [place], [place] 401-027-2536   [facility] [address]  [place], [place] 644-034-7425   [facility] Intensive Outpatient [address], [place], [place] 956-387-5643   [facility] [address] [place]  [place], [place] 3-295-188-4166 or 863-070-7590   [facility] ([facility] [address], [place], [place] 323-557-3220 Accepts Medicaid  [facility] [address],  [place] 2-542-706-2376 Substance Abuse/Addiction Treatment   [facility]         Address  Phone  Notes  [facility]  [phone]   Angie Fava, PhD [address] [place], [place]   909 186 9862 or 8303855900   [facility]   [address] [place], [place] 947-203-8375   [facility] [address], [place], [place] (857)335-6705 Insurance/Medicaid/sponsorship through [facility] [address]                                    [place], [place] [phone] Therapy/tele-psych/case    [facility] [address], [place] [phone]    Dr. Lolly Mustache  [phone]   [facility]  [employer] [facility] Dept. 1) [address] [place] 2) [address] [place] 3)  [address], [place] [phone]  (815)[phone]   [facility] Child Abuse Hotline 951-139-3379 or (541)712-8558 (After Hours)       Headaches, Frequently Asked Questions MIGRAINE HEADACHES Q: What is migraine? What causes it? How can I treat it? A: Generally, migraine headaches begin as a dull ache. Then they develop into a constant, throbbing, and pulsating pain. You may experience pain at the temples. You may experience pain at the front or back of one or both sides of the head. The pain is usually accompanied by a combination of:  Nausea.  Vomiting.  Sensitivity to light and noise. Some people (about 15%) experience an aura (see below) before an attack. The cause of migraine is believed to be chemical reactions in the brain. Treatment for migraine may include over-the-counter or prescription medications. It may also include self-help techniques. These include relaxation training and biofeedback.  Q: What is an aura? A: About 15% of people with migraine get an "aura". This is a sign of neurological symptoms that occur before a migraine headache. You may see wavy or jagged lines, dots, or flashing lights. You might experience tunnel vision or blind spots in one or both eyes. The aura can include visual or auditory hallucinations (something imagined). It may include disruptions in smell (such as strange odors), taste or touch. Other symptoms include:  Numbness.  A "pins and needles" sensation.  Difficulty in recalling or speaking the correct word. These neurological events may last as long as 60  minutes. These symptoms will fade as the headache begins. Q: What is a trigger? A: Certain physical or environmental factors  can lead to or "trigger" a migraine. These include:  Foods.  Hormonal changes.  Weather.  Stress. It is important to remember that triggers are different for everyone. To help prevent migraine attacks, you need to figure out which triggers affect you. Keep a headache diary. This is a good way to track triggers. The diary will help you talk to your healthcare professional about your condition. Q: Does weather affect migraines? A: Bright sunshine, hot, humid conditions, and drastic changes in barometric pressure may lead to, or "trigger," a migraine attack in some people. But studies have shown that weather does not act as a trigger for everyone with migraines. Q: What is the link between migraine and hormones? A: Hormones start and regulate many of your body's functions. Hormones keep your body in balance within a constantly changing environment. The levels of hormones in your body are unbalanced at times. Examples are during menstruation, pregnancy, or menopause. That can lead to a migraine attack. In fact, about three quarters of all women with migraine report that their attacks are related to the menstrual cycle.  Q: Is there an increased risk of stroke for migraine sufferers? A: The likelihood of a migraine attack causing a stroke is very remote. That is not to say that migraine sufferers cannot have a stroke associated with their migraines. In persons under age 38, the most common associated factor for stroke is migraine headache. But over the course of a person's normal life span, the occurrence of migraine headache may actually be associated with a reduced risk of dying from cerebrovascular disease due to stroke.  Q: What are acute medications for migraine? A: Acute medications are used to treat the pain of the headache after it has started. Examples over-the-counter medications, NSAIDs, ergots, and triptans.  Q: What are the triptans? A: Triptans are the newest class of abortive  medications. They are specifically targeted to treat migraine. Triptans are vasoconstrictors. They moderate some chemical reactions in the brain. The triptans work on receptors in your brain. Triptans help to restore the balance of a neurotransmitter called serotonin. Fluctuations in levels of serotonin are thought to be a main cause of migraine.  Q: Are over-the-counter medications for migraine effective? A: Over-the-counter, or "OTC," medications may be effective in relieving mild to moderate pain and associated symptoms of migraine. But you should see your caregiver before beginning any treatment regimen for migraine.  Q: What are preventive medications for migraine? A: Preventive medications for migraine are sometimes referred to as "prophylactic" treatments. They are used to reduce the frequency, severity, and length of migraine attacks. Examples of preventive medications include antiepileptic medications, antidepressants, beta-blockers, calcium channel blockers, and NSAIDs (nonsteroidal anti-inflammatory drugs). Q: Why are anticonvulsants used to treat migraine? A: During the past few years, there has been an increased interest in antiepileptic drugs for the prevention of migraine. They are sometimes referred to as "anticonvulsants". Both epilepsy and migraine may be caused by similar reactions in the brain.  Q: Why are antidepressants used to treat migraine? A: Antidepressants are typically used to treat people with depression. They may reduce migraine frequency by regulating chemical levels, such as serotonin, in the brain.  Q: What alternative therapies are used to treat migraine? A: The term "alternative therapies" is often used to describe treatments considered outside the scope of conventional Western medicine. Examples of alternative therapy include acupuncture,  acupressure, and yoga. Another common alternative treatment is herbal therapy. Some herbs are believed to relieve headache pain.  Always discuss alternative therapies with your caregiver before proceeding. Some herbal products contain arsenic and other toxins. TENSION HEADACHES Q: What is a tension-type headache? What causes it? How can I treat it? A: Tension-type headaches occur randomly. They are often the result of temporary stress, anxiety, fatigue, or anger. Symptoms include soreness in your temples, a tightening band-like sensation around your head (a "vice-like" ache). Symptoms can also include a pulling feeling, pressure sensations, and contracting head and neck muscles. The headache begins in your forehead, temples, or the back of your head and neck. Treatment for tension-type headache may include over-the-counter or prescription medications. Treatment may also include self-help techniques such as relaxation training and biofeedback. CLUSTER HEADACHES Q: What is a cluster headache? What causes it? How can I treat it? A: Cluster headache gets its name because the attacks come in groups. The pain arrives with little, if any, warning. It is usually on one side of the head. A tearing or bloodshot eye and a runny nose on the same side of the headache may also accompany the pain. Cluster headaches are believed to be caused by chemical reactions in the brain. They have been described as the most severe and intense of any headache type. Treatment for cluster headache includes prescription medication and oxygen. SINUS HEADACHES Q: What is a sinus headache? What causes it? How can I treat it? A: When a cavity in the bones of the face and skull (a sinus) becomes inflamed, the inflammation will cause localized pain. This condition is usually the result of an allergic reaction, a tumor, or an infection. If your headache is caused by a sinus blockage, such as an infection, you will probably have a fever. An x-ray will confirm a sinus blockage. Your caregiver's treatment might include antibiotics for the infection, as well as antihistamines  or decongestants.  REBOUND HEADACHES Q: What is a rebound headache? What causes it? How can I treat it? A: A pattern of taking acute headache medications too often can lead to a condition known as "rebound headache." A pattern of taking too much headache medication includes taking it more than 2 days per week or in excessive amounts. That means more than the label or a caregiver advises. With rebound headaches, your medications not only stop relieving pain, they actually begin to cause headaches. Doctors treat rebound headache by tapering the medication that is being overused. Sometimes your caregiver will gradually substitute a different type of treatment or medication. Stopping may be a challenge. Regularly overusing a medication increases the potential for serious side effects. Consult a caregiver if you regularly use headache medications more than 2 days per week or more than the label advises. ADDITIONAL QUESTIONS AND ANSWERS Q: What is biofeedback? A: Biofeedback is a self-help treatment. Biofeedback uses special equipment to monitor your body's involuntary physical responses. Biofeedback monitors:  Breathing.  Pulse.  Heart rate.  Temperature.  Muscle tension.  Brain activity. Biofeedback helps you refine and perfect your relaxation exercises. You learn to control the physical responses that are related to stress. Once the technique has been mastered, you do not need the equipment any more. Q: Are headaches hereditary? A: Four out of five (80%) of people that suffer report a family history of migraine. Scientists are not sure if this is genetic or a family predisposition. Despite the uncertainty, a child has a 50% chance of having migraine  if one parent suffers. The child has a 75% chance if both parents suffer.  Q: Can children get headaches? A: By the time they reach high school, most young people have experienced some type of headache. Many safe and effective approaches or  medications can prevent a headache from occurring or stop it after it has begun.  Q: What type of doctor should I see to diagnose and treat my headache? A: Start with your primary caregiver. Discuss his or her experience and approach to headaches. Discuss methods of classification, diagnosis, and treatment. Your caregiver may decide to recommend you to a headache specialist, depending upon your symptoms or other physical conditions. Having diabetes, allergies, etc., may require a more comprehensive and inclusive approach to your headache. The National Headache Foundation will provide, upon request, a list of [facility] physician members in your state. Document Released: 04/17/2003 Document Revised: 04/19/2011 Document Reviewed: 09/25/2007 [facility] Patient Information 2015 [place], [place]. This information is not intended to replace advice given to you by your health care provider. Make sure you discuss any questions you have with your health care provider.  Migraine Headache A migraine headache is an intense, throbbing pain on one or both sides of your head. A migraine can last for 30 minutes to several hours. CAUSES  The exact cause of a migraine headache is not always known. However, a migraine may be caused when nerves in the brain become irritated and release chemicals that cause inflammation. This causes pain. Certain things may also trigger migraines, such as:  Alcohol.  Smoking.  Stress.  Menstruation.  Aged cheeses.  Foods or drinks that contain nitrates, glutamate, aspartame, or tyramine.  Lack of sleep.  Chocolate.  Caffeine.  Hunger.  Physical exertion.  Fatigue.  Medicines used to treat chest pain (nitroglycerine), birth control pills, estrogen, and some blood pressure medicines. SIGNS AND SYMPTOMS  Pain on one or both sides of your head.  Pulsating or throbbing pain.  Severe pain that prevents daily activities.  Pain that is aggravated by any physical  activity.  Nausea, vomiting, or both.  Dizziness.  Pain with exposure to bright lights, loud noises, or activity.  General sensitivity to bright lights, loud noises, or smells. Before you get a migraine, you may get warning signs that a migraine is coming (aura). An aura may include:  Seeing flashing lights.  Seeing bright spots, halos, or zigzag lines.  Having tunnel vision or blurred vision.  Having feelings of numbness or tingling.  Having trouble talking.  Having muscle weakness. DIAGNOSIS  A migraine headache is often diagnosed based on:  Symptoms.  Physical exam.  A CT scan or MRI of your head. These imaging tests cannot diagnose migraines, but they can help rule out other causes of headaches. TREATMENT Medicines may be given for pain and nausea. Medicines can also be given to help prevent recurrent migraines.  HOME CARE INSTRUCTIONS  Only take over-the-counter or prescription medicines for pain or discomfort as directed by your health care provider. The use of long-term narcotics is not recommended.  Lie down in a dark, quiet room when you have a migraine.  Keep a journal to find out what may trigger your migraine headaches. For example, write down:  What you eat and drink.  How much sleep you get.  Any change to your diet or medicines.  Limit alcohol consumption.  Quit smoking if you smoke.  Get 7-9 hours of sleep, or as recommended by your health care provider.  Limit stress.  Keep lights dim if bright lights bother you and make your migraines worse. SEEK IMMEDIATE MEDICAL CARE IF:   Your migraine becomes severe.  You have a fever.  You have a stiff neck.  You have vision loss.  You have muscular weakness or loss of muscle control.  You start losing your balance or have trouble walking.  You feel faint or pass out.  You have severe symptoms that are different from your first symptoms. MAKE SURE YOU:   Understand these  instructions.  Will watch your condition.  Will get help right away if you are not doing well or get worse. Document Released: 01/25/2005 Document Revised: 06/11/2013 Document Reviewed: 10/02/2012 [facility] Patient Information 2015 [place], [place]. This information is not intended to replace advice given to you by your health care provider. Make sure you discuss any questions you have with your health care provider.

## 2014-05-07 NOTE — ED Provider Notes (Signed)
CSN: [id number]     Arrival date & time 05/07/14  1028 History   First MD Initiated Contact with Patient 05/07/14 1144     Chief Complaint  Patient presents with  . Migraine     (Consider location/radiation/quality/duration/timing/severity/associated sxs/prior Treatment) HPI   This male with headache. Gradual onset on Sunday. Relatively constant since then. Patient has a past history of what he calls migraine headaches. Current headache feel similar to previous. Denies any trauma. No fevers or chills. Photo and phonophobia. No eye complaints otherwise. No acute numbness, tingling or loss of strength. No blood thinners Has been taking NSAIDs with minimal relief.  Past Medical History  Diagnosis Date  . TIA (transient ischemic attack)   . Anxiety    History reviewed. No pertinent past surgical history. History reviewed. No pertinent family history. History  Substance Use Topics  . Smoking status: Current Every Day Smoker -- 1.00 packs/day for 20 years    Types: Cigarettes  . Smokeless tobacco: Not on file  . Alcohol Use: Yes     Comment: rarely    Review of Systems  All systems reviewed and negative, other than as noted in HPI.   Allergies  Review of patient's allergies indicates no known allergies.  Home Medications   Prior to Admission medications   Medication Sig Start Date End Date Taking? Authorizing Provider  Acetaminophen-Caffeine (EXCEDRIN QUICKTABS PO) Take 3 tablets by mouth every 4 (four) hours as needed (for headache).   Yes Historical Provider, MD  ALPRAZolam Prudy Feeler(XANAX) 1 MG tablet Take 1 mg by mouth 4 (four) times daily as needed for anxiety.    Yes Historical Provider, MD  ibuprofen (ADVIL,MOTRIN) 200 MG tablet Take 800 mg by mouth every 6 (six) hours as needed for moderate pain (head & shoulder pain).   Yes Historical Provider, MD  oxymetazoline (AFRIN) 0.05 % nasal spray Place 2 sprays into both nostrils every 4 (four) hours as needed for congestion.    Yes  Historical Provider, MD  phenylephrine (SUDAFED PE) 10 MG TABS tablet Take 10 mg by mouth every 4 (four) hours as needed (for cold).   Yes Historical Provider, MD  ciprofloxacin (CIPRO) 500 MG tablet Take 1 tablet (500 mg total) by mouth 2 (two) times daily. Patient not taking: Reported on 02/20/2014 09/17/13   Tatyana Kirichenko, PA-C  cyclobenzaprine (FLEXERIL) 10 MG tablet Take 1 tablet (10 mg total) by mouth 2 (two) times daily as needed for muscle spasms. Patient not taking: Reported on 03/11/2014 02/20/14   Marlon Peliffany Greene, PA-C  HYDROcodone-acetaminophen (NORCO/VICODIN) 5-325 MG per tablet Take 1-2 tablets by mouth every 4 (four) hours as needed for moderate pain or severe pain. Patient not taking: Reported on 03/11/2014 02/20/14   Marlon Peliffany Greene, PA-C  naproxen (NAPROSYN) 375 MG tablet Take 1 tablet (375 mg total) by mouth 2 (two) times daily. Patient not taking: Reported on 05/07/2014 02/20/14   Marlon Peliffany Greene, PA-C   BP 124/71 mmHg  Pulse 90  Temp(Src) 98.3 F (36.8 C) (Oral)  Resp 16  SpO2 99% Physical Exam  Constitutional: He is oriented to person, place, and time. He appears well-developed and well-nourished. No distress.  HENT:  Head: Normocephalic and atraumatic.  Eyes: Conjunctivae are normal. Right eye exhibits no discharge. Left eye exhibits no discharge.  Neck: Neck supple.  No nuchal rigidity  Cardiovascular: Normal rate, regular rhythm and normal heart sounds.  Exam reveals no gallop and no friction rub.   No murmur heard. Pulmonary/Chest: Effort normal and breath  sounds normal. No respiratory distress.  Abdominal: Soft. He exhibits no distension. There is no tenderness.  Musculoskeletal: He exhibits no edema or tenderness.  Neurological: He is alert and oriented to person, place, and time. No cranial nerve deficit. He exhibits normal muscle tone. Coordination normal.  Good finger to nose b/l. Gait steady.   Skin: Skin is warm and dry.  Psychiatric: He has a normal mood and  affect. His behavior is normal. Thought content normal.  Nursing note and vitals reviewed.   ED Course  Procedures (including critical care time) Labs Review Labs Reviewed - No data to display  Imaging Review No results found.   EKG Interpretation None      MDM   Final diagnoses:  Nonintractable headache, unspecified chronicity pattern, unspecified headache type    38yM with headache. Suspect primary HA. Consider emergent secondary causes such as bleed, infectious or mass but doubt. There is no history of trauma. Pt has a nonfocal neurological exam. Afebrile and neck supple. No use of blood thinning medication. Consider ocular etiology such as acute angle closure glaucoma but doubt. Pt denies acute change in visual acuity and eye exam unremarkable. Doubt temporal arteritis given age, no temporal tenderness and temporal artery pulsations palpable. Doubt CO poisoning. No contacts with similar symptoms. Doubt venous thrombosis. Doubt carotid or vertebral arteries dissection. Symptoms improved with meds. Says he currently has no pain at all. Feel that can be safely discharged, but strict return precautions discussed. Outpt fu.     Raeford Razor, MD 05/09/14 1026

## 2014-05-07 NOTE — ED Notes (Addendum)
Pt reports migraine since Sunday. Hx of migraines that have caused  TIAs, last TIA in February. Pt reports pain 8/10, sensitivity to light, sound, and odor. Pt has seen neurologist in the past, but lost his insurance.

## 2014-06-06 ENCOUNTER — Emergency Department (HOSPITAL_COMMUNITY)
Admission: EM | Admit: 2014-06-06 | Discharge: 2014-06-06 | Disposition: A | Discharge status: Home / Self Care | Payer: Self-pay | Attending: Emergency Medicine | Admitting: Emergency Medicine

## 2014-06-06 ENCOUNTER — Encounter (HOSPITAL_COMMUNITY): Payer: Self-pay | Admitting: Emergency Medicine

## 2014-06-06 DIAGNOSIS — Z720 Tobacco use: Secondary | ICD-10-CM | POA: Insufficient documentation

## 2014-06-06 DIAGNOSIS — Z792 Long term (current) use of antibiotics: Secondary | ICD-10-CM | POA: Insufficient documentation

## 2014-06-06 DIAGNOSIS — Z79899 Other long term (current) drug therapy: Secondary | ICD-10-CM | POA: Insufficient documentation

## 2014-06-06 DIAGNOSIS — Z8673 Personal history of transient ischemic attack (TIA), and cerebral infarction without residual deficits: Secondary | ICD-10-CM | POA: Insufficient documentation

## 2014-06-06 DIAGNOSIS — M5442 Lumbago with sciatica, left side: Secondary | ICD-10-CM | POA: Insufficient documentation

## 2014-06-06 DIAGNOSIS — F419 Anxiety disorder, unspecified: Secondary | ICD-10-CM | POA: Insufficient documentation

## 2014-06-06 MED ORDER — TRAMADOL HCL 50 MG PO TABS: 50.0000 mg | ORAL_TABLET | Freq: Four times a day (QID) | ORAL | Status: DC | PRN

## 2014-06-06 MED ORDER — DIAZEPAM 5 MG PO TABS
5.0000 mg | ORAL_TABLET | Freq: Two times a day (BID) | ORAL | Status: DC
Start: 2014-06-06 — End: 2015-01-16

## 2014-06-06 MED ORDER — IBUPROFEN 200 MG PO TABS: 800.0000 mg | ORAL_TABLET | Freq: Three times a day (TID) | ORAL | Status: AC

## 2014-06-06 NOTE — ED Notes (Signed)
Pt reports worsening chronic lower back pain/left leg pain; diagnosed with sciatica post fall 3 years ago. Pt denies recent fall/injury.

## 2014-06-06 NOTE — ED Provider Notes (Signed)
CSN: [id number]     Arrival date & time 06/06/14  4098 History   First MD Initiated Contact with Patient 06/06/14 [phone]     Chief Complaint  Patient presents with  . Back Pain  . Leg Pain     (Consider location/radiation/quality/duration/timing/severity/associated sxs/prior Treatment) HPI Patient presents with concern of back pain. This episode began about 3 days ago.  Since onset pain has been bilateral, lower back, sore, severe, with radiation down the posterior of the left leg. There is no new fever, chills, incontinence, falling, disequilibrium. Minimal relief with rest, occasional ibuprofen, occasional tramadol. Patient has a history of minor trauma several years ago, resulting in similar constellation of symptoms. He notes that he has had minimal episodes in the interval. No history of trauma this episode, nor any new precipitant.  Past Medical History  Diagnosis Date  . TIA (transient ischemic attack)   . Anxiety    History reviewed. No pertinent past surgical history. No family history on file. History  Substance Use Topics  . Smoking status: Current Every Day Smoker -- 1.00 packs/day for 20 years    Types: Cigarettes  . Smokeless tobacco: Not on file  . Alcohol Use: Yes     Comment: rarely    Review of Systems  Constitutional:       Per HPI, otherwise negative  HENT:       Per HPI, otherwise negative  Respiratory:       Per HPI, otherwise negative  Cardiovascular:       Per HPI, otherwise negative  Gastrointestinal: Negative for vomiting.  Endocrine:       Negative aside from HPI  Genitourinary:       Neg aside from HPI   Musculoskeletal:       Per HPI, otherwise negative  Skin: Negative.   Neurological: Negative for syncope.      Allergies  Review of patient's allergies indicates no known allergies.  Home Medications   Prior to Admission medications   Medication Sig Start Date End Date Taking? Authorizing Provider  acetaminophen (TYLENOL) 500  MG tablet Take 500-1,000 mg by mouth every 6 (six) hours as needed for mild pain or moderate pain.   Yes Historical Provider, MD  oxymetazoline (AFRIN) 0.05 % nasal spray Place 2 sprays into both nostrils every 4 (four) hours as needed for congestion.    Yes Historical Provider, MD  propranolol (INDERAL) 20 MG tablet Take 20 mg by mouth daily.   Yes Historical Provider, MD  ciprofloxacin (CIPRO) 500 MG tablet Take 1 tablet (500 mg total) by mouth 2 (two) times daily. Patient not taking: Reported on 02/20/2014 09/17/13   Tatyana Kirichenko, PA-C  cyclobenzaprine (FLEXERIL) 10 MG tablet Take 1 tablet (10 mg total) by mouth 2 (two) times daily as needed for muscle spasms. Patient not taking: Reported on 03/11/2014 02/20/14   Marlon Pel, PA-C  diazepam (VALIUM) 5 MG tablet Take 1 tablet (5 mg total) by mouth 2 (two) times daily. 06/06/14   Gerhard Munch, MD  HYDROcodone-acetaminophen (NORCO/VICODIN) 5-325 MG per tablet Take 1-2 tablets by mouth every 4 (four) hours as needed for moderate pain or severe pain. Patient not taking: Reported on 03/11/2014 02/20/14   Marlon Pel, PA-C  ibuprofen (ADVIL,MOTRIN) 200 MG tablet Take 4 tablets (800 mg total) by mouth 3 (three) times daily. 06/06/14 06/10/14  Gerhard Munch, MD  naproxen (NAPROSYN) 375 MG tablet Take 1 tablet (375 mg total) by mouth 2 (two) times daily. Patient not taking: Reported  on 05/07/2014 02/20/14   Marlon Peliffany Greene, PA-C  traMADol (ULTRAM) 50 MG tablet Take 1 tablet (50 mg total) by mouth every 6 (six) hours as needed. 06/06/14   Gerhard Munchobert Razan Siler, MD   BP 140/96 mmHg  Pulse 86  Temp(Src) 98.2 F (36.8 C) (Oral)  Resp 18  SpO2 100% Physical Exam  Constitutional: He is oriented to person, place, and time. He appears well-developed. No distress.  HENT:  Head: Normocephalic and atraumatic.  Eyes: Conjunctivae and EOM are normal.  Cardiovascular: Normal rate and regular rhythm.   Pulmonary/Chest: Effort normal. No stridor. No respiratory  distress.  Abdominal: He exhibits no distension.  Musculoskeletal: He exhibits no edema.  Gait is appropriate, and the patient flexes each hip independently, though with increasing pain of lower back with hip flexion bilaterally, more on the left.   Neurological: He is alert and oriented to person, place, and time. No cranial nerve deficit. He exhibits normal muscle tone. Coordination normal.  Skin: Skin is warm and dry.  Psychiatric: He has a normal mood and affect.  Nursing note and vitals reviewed.   ED Course  Procedures (including critical care time)   MDM   Final diagnoses:  Bilateral low back pain with left-sided sciatica   patient presents with back pain. No red flags for neurologic dysfunction, nor infection. Patient started on a course of analgesics, muscle relaxants, cryotherapy, discharged in stable condition after being provided outpatient resources to assist with appropriate ongoing care, physical therapy.    Gerhard Munchobert Whittley Carandang, MD 06/06/14 [phone]

## 2014-06-06 NOTE — Discharge Instructions (Signed)
As discussed, your evaluation today has been largely reassuring.  But, it is important that you monitor your condition carefully, and do not hesitate to return to the ED if you develop new, or concerning changes in your condition.  While you are taking the Valium, you should stop taking Xanax.  Do not drive or operate heavy machinery while on these medications.  Please be sure to follow-up with our affiliated [facility] to arrange ongoing care, and discuss physical therapy.

## 2014-08-25 ENCOUNTER — Emergency Department (HOSPITAL_COMMUNITY)
Admission: EM | Admit: 2014-08-25 | Discharge: 2014-08-25 | Disposition: A | Discharge status: Home / Self Care | Payer: Self-pay | Attending: Emergency Medicine | Admitting: Emergency Medicine

## 2014-08-25 ENCOUNTER — Encounter (HOSPITAL_COMMUNITY): Payer: Self-pay

## 2014-08-25 DIAGNOSIS — Z8673 Personal history of transient ischemic attack (TIA), and cerebral infarction without residual deficits: Secondary | ICD-10-CM | POA: Insufficient documentation

## 2014-08-25 DIAGNOSIS — Z79899 Other long term (current) drug therapy: Secondary | ICD-10-CM | POA: Insufficient documentation

## 2014-08-25 DIAGNOSIS — R059 Cough, unspecified: Secondary | ICD-10-CM

## 2014-08-25 DIAGNOSIS — R05 Cough: Secondary | ICD-10-CM

## 2014-08-25 DIAGNOSIS — J0100 Acute maxillary sinusitis, unspecified: Secondary | ICD-10-CM | POA: Insufficient documentation

## 2014-08-25 DIAGNOSIS — F419 Anxiety disorder, unspecified: Secondary | ICD-10-CM | POA: Insufficient documentation

## 2014-08-25 DIAGNOSIS — Z720 Tobacco use: Secondary | ICD-10-CM | POA: Insufficient documentation

## 2014-08-25 MED ORDER — HYDROCODONE-HOMATROPINE 5-1.5 MG/5ML PO SYRP: 5.0000 mL | ORAL_SOLUTION | Freq: Four times a day (QID) | ORAL | Status: DC | PRN

## 2014-08-25 MED ORDER — AMOXICILLIN-POT CLAVULANATE 875-125 MG PO TABS: 1.0000 | ORAL_TABLET | Freq: Two times a day (BID) | ORAL | Status: DC

## 2014-08-25 NOTE — ED Provider Notes (Signed)
CSN: [id number]     Arrival date & time 08/25/14  1741 History  This chart was scribed for non-physician practitioner, Catha GosselinHanna Patel-Mills, PA-C, working with Richardean Canalavid H Yao, MD, by Ronney LionSuzanne Le, ED Scribe. This patient was seen in room TR07C/TR07C and the patient's care was started at .    Chief Complaint  Patient presents with  . Cough  . Ear Pain   The history is provided by the patient. No language interpreter was used.   HPI Comments: Michael [name] is a 39 y.o. male who presents to the Emergency Department complaining of constant, severe cough productive with clear sputum and bilateral ear congestion that began 2 weeks ago. He also complains of associated right otalgia, nasal congestion, and sinus pressure. Patient has taken OTC sinus medication with minimal relief. Patient is a smoker, although he denies a history of asthma or COPD. He also denies fever, sore throat, hemoptysis, or shortness of breath. Patient has NKDA.   Past Medical History  Diagnosis Date  . TIA (transient ischemic attack)   . Anxiety    History reviewed. No pertinent past surgical history. History reviewed. No pertinent family history. History  Substance Use Topics  . Smoking status: Current Every Day Smoker -- 0.50 packs/day for 20 years    Types: Cigarettes  . Smokeless tobacco: Not on file  . Alcohol Use: Yes     Comment: rarely    Review of Systems  Constitutional: Negative for fever.  HENT: Positive for congestion, ear pain and sinus pressure. Negative for sore throat.   Respiratory: Positive for cough.     Allergies  Review of patient's allergies indicates no known allergies.  Home Medications   Prior to Admission medications   Medication Sig Start Date End Date Taking? Authorizing Provider  acetaminophen (TYLENOL) 500 MG tablet Take 500-1,000 mg by mouth every 6 (six) hours as needed for mild pain or moderate pain.    Historical Provider, MD  amoxicillin-clavulanate (AUGMENTIN) 875-125 MG per  tablet Take 1 tablet by mouth every 12 (twelve) hours. 08/25/14   Elanda Garmany Patel-Mills, PA-C  ciprofloxacin (CIPRO) 500 MG tablet Take 1 tablet (500 mg total) by mouth 2 (two) times daily. Patient not taking: Reported on 02/20/2014 09/17/13   Tatyana Kirichenko, PA-C  cyclobenzaprine (FLEXERIL) 10 MG tablet Take 1 tablet (10 mg total) by mouth 2 (two) times daily as needed for muscle spasms. Patient not taking: Reported on 03/11/2014 02/20/14   Marlon Peliffany Greene, PA-C  diazepam (VALIUM) 5 MG tablet Take 1 tablet (5 mg total) by mouth 2 (two) times daily. 06/06/14   Gerhard Munchobert Lockwood, MD  HYDROcodone-acetaminophen (NORCO/VICODIN) 5-325 MG per tablet Take 1-2 tablets by mouth every 4 (four) hours as needed for moderate pain or severe pain. Patient not taking: Reported on 03/11/2014 02/20/14   Marlon Peliffany Greene, PA-C  HYDROcodone-homatropine [facility](HYCODAN) 5-1.5 MG/5ML syrup Take 5 mLs by mouth every 6 (six) hours as needed for cough. 08/25/14   Sacoya Mcgourty Patel-Mills, PA-C  naproxen (NAPROSYN) 375 MG tablet Take 1 tablet (375 mg total) by mouth 2 (two) times daily. Patient not taking: Reported on 05/07/2014 02/20/14   Marlon Peliffany Greene, PA-C  oxymetazoline (AFRIN) 0.05 % nasal spray Place 2 sprays into both nostrils every 4 (four) hours as needed for congestion.     Historical Provider, MD  propranolol (INDERAL) 20 MG tablet Take 20 mg by mouth daily.    Historical Provider, MD  traMADol (ULTRAM) 50 MG tablet Take 1 tablet (50 mg total) by mouth every 6 (  six) hours as needed. 06/06/14   Gerhard Munch, MD   BP 131/88 mmHg  Pulse 79  Temp(Src) 98.5 F (36.9 C)  Resp 20  Ht  (1.905 m)  Wt 220 lb (99.791 kg)  BMI 27.50 kg/m2  SpO2 98% Physical Exam  Constitutional: He is oriented to person, place, and time. He appears well-developed and well-nourished. No distress.  HENT:  Head: Normocephalic and atraumatic.  Right Ear: Tympanic membrane, external ear and ear canal normal. Tympanic membrane is not injected, not erythematous  and not retracted.  Left Ear: Tympanic membrane, external ear and ear canal normal. Tympanic membrane is not injected, not erythematous and not retracted.  Mouth/Throat: Uvula is midline, oropharynx is clear and moist and mucous membranes are normal. No trismus in the jaw. No uvula swelling. No oropharyngeal exudate, posterior oropharyngeal edema, posterior oropharyngeal erythema or tonsillar abscesses.  Frontal and maxillary sinus tenderness to palpation.  Eyes: Conjunctivae and EOM are normal.  Neck: Neck supple. No tracheal deviation present.  Cardiovascular: Normal rate and regular rhythm.   Pulmonary/Chest: Effort normal and breath sounds normal. No accessory muscle usage. No respiratory distress. He has no decreased breath sounds. He has no wheezes. He has no rhonchi. He has no rales.  Musculoskeletal: Normal range of motion.  Neurological: He is alert and oriented to person, place, and time.  Skin: Skin is warm and dry.  Psychiatric: He has a normal mood and affect. His behavior is normal.  Nursing note and vitals reviewed.   ED Course  Procedures (including critical care time)  DIAGNOSTIC STUDIES: Oxygen Saturation is 98% on RA, normal by my interpretation.    COORDINATION OF CARE: 6:13 PM - Discussed treatment plan with pt at bedside which includes Rx antibiotics, and pt agreed to plan.  MDM   Final diagnoses:  Acute maxillary sinusitis, recurrence not specified  Cough   I do not suspect pneumonia. Patient symptoms are consistent with sinusitis and this has been going on for 2 weeks. He has maxillary and frontal sinus tenderness to palpation. His vitals are stable he is afebrile. He is well-appearing and in no acute distress. I have given him Augmentin for sinusitis and Hycodan for cough. I personally performed the services described in this documentation, which was scribed in my presence. The recorded information has been reviewed and is accurate.     Catha Gosselin,  PA-C 08/25/14 1843  Richardean Canal, MD 08/25/14 [phone]

## 2014-08-25 NOTE — Discharge Instructions (Signed)
Sinusitis Take antibiotics as prescribed. Return for fever or shortness of breath. Sinusitis is redness, soreness, and inflammation of the paranasal sinuses. Paranasal sinuses are air pockets within the bones of your face (beneath the eyes, the middle of the forehead, or above the eyes). In healthy paranasal sinuses, mucus is able to drain out, and air is able to circulate through them by way of your nose. However, when your paranasal sinuses are inflamed, mucus and air can become trapped. This can allow bacteria and other germs to grow and cause infection. Sinusitis can develop quickly and last only a short time (acute) or continue over a long period (chronic). Sinusitis that lasts for more than 12 weeks is considered chronic.  CAUSES  Causes of sinusitis include:  Allergies.  Structural abnormalities, such as displacement of the cartilage that separates your nostrils (deviated septum), which can decrease the air flow through your nose and sinuses and affect sinus drainage.  Functional abnormalities, such as when the small hairs (cilia) that line your sinuses and help remove mucus do not work properly or are not present. SIGNS AND SYMPTOMS  Symptoms of acute and chronic sinusitis are the same. The primary symptoms are pain and pressure around the affected sinuses. Other symptoms include:  Upper toothache.  Earache.  Headache.  Bad breath.  Decreased sense of smell and taste.  A cough, which worsens when you are lying flat.  Fatigue.  Fever.  Thick drainage from your nose, which often is green and may contain pus (purulent).  Swelling and warmth over the affected sinuses. DIAGNOSIS  Your health care provider will perform a physical exam. During the exam, your health care provider may:  Look in your nose for signs of abnormal growths in your nostrils (nasal polyps).  Tap over the affected sinus to check for signs of infection.  View the inside of your sinuses (endoscopy) using  an imaging device that has a light attached (endoscope). If your health care provider suspects that you have chronic sinusitis, one or more of the following tests may be recommended:  Allergy tests.  Nasal culture. A sample of mucus is taken from your nose, sent to a lab, and screened for bacteria.  Nasal cytology. A sample of mucus is taken from your nose and examined by your health care provider to determine if your sinusitis is related to an allergy. TREATMENT  Most cases of acute sinusitis are related to a viral infection and will resolve on their own within 10 days. Sometimes medicines are prescribed to help relieve symptoms (pain medicine, decongestants, nasal steroid sprays, or saline sprays).  However, for sinusitis related to a bacterial infection, your health care provider will prescribe antibiotic medicines. These are medicines that will help kill the bacteria causing the infection.  Rarely, sinusitis is caused by a fungal infection. In theses cases, your health care provider will prescribe antifungal medicine. For some cases of chronic sinusitis, surgery is needed. Generally, these are cases in which sinusitis recurs more than 3 times per year, despite other treatments. HOME CARE INSTRUCTIONS   Drink plenty of water. Water helps thin the mucus so your sinuses can drain more easily.  Use a humidifier.  Inhale steam 3 to 4 times a day (for example, sit in the bathroom with the shower running).  Apply a warm, moist washcloth to your face 3 to 4 times a day, or as directed by your health care provider.  Use saline nasal sprays to help moisten and clean your sinuses.  Take medicines only as directed by your health care provider.  If you were prescribed either an antibiotic or antifungal medicine, finish it all even if you start to feel better. SEEK IMMEDIATE MEDICAL CARE IF:  You have increasing pain or severe headaches.  You have nausea, vomiting, or drowsiness.  You have  swelling around your face.  You have vision problems.  You have a stiff neck.  You have difficulty breathing. MAKE SURE YOU:   Understand these instructions.  Will watch your condition.  Will get help right away if you are not doing well or get worse. Document Released: 01/25/2005 Document Revised: 06/11/2013 Document Reviewed: 02/09/2011 [facility] Patient Information 2015 [place], [place]. This information is not intended to replace advice given to you by your health care provider. Make sure you discuss any questions you have with your health care provider.

## 2014-08-25 NOTE — ED Notes (Signed)
Pt reports onset 2 weeks non productive cough.  Onset 1 week bilateral ears muffled, and right ear pain.  No respiratory difficulties.  Talking in complete sentences.

## 2014-08-25 NOTE — ED Notes (Signed)
Patient is alert and orientedx4.  Patient was explained discharge instructions and they understood them with no questions.  The patient's neighbor, Oletta CohnMarlene Crews is taking the patient home.

## 2015-01-16 ENCOUNTER — Emergency Department (HOSPITAL_COMMUNITY): Payer: Self-pay

## 2015-01-16 ENCOUNTER — Emergency Department (HOSPITAL_COMMUNITY)
Admission: EM | Admit: 2015-01-16 | Discharge: 2015-01-16 | Disposition: A | Discharge status: Home / Self Care | Payer: Self-pay | Attending: Emergency Medicine | Admitting: Emergency Medicine

## 2015-01-16 ENCOUNTER — Encounter (HOSPITAL_COMMUNITY): Payer: Self-pay | Admitting: Emergency Medicine

## 2015-01-16 DIAGNOSIS — R200 Anesthesia of skin: Secondary | ICD-10-CM | POA: Insufficient documentation

## 2015-01-16 DIAGNOSIS — R079 Chest pain, unspecified: Secondary | ICD-10-CM | POA: Insufficient documentation

## 2015-01-16 DIAGNOSIS — R202 Paresthesia of skin: Secondary | ICD-10-CM

## 2015-01-16 DIAGNOSIS — R531 Weakness: Secondary | ICD-10-CM

## 2015-01-16 LAB — RAPID URINE DRUG SCREEN, HOSP PERFORMED
AMPHETAMINES: POSITIVE — AB
Barbiturates: NOT DETECTED
Benzodiazepines: POSITIVE — AB
COCAINE: NOT DETECTED
OPIATES: NOT DETECTED
TETRAHYDROCANNABINOL: NOT DETECTED

## 2015-01-16 LAB — URINALYSIS, ROUTINE W REFLEX MICROSCOPIC
BILIRUBIN URINE: NEGATIVE
GLUCOSE, UA: 100 mg/dL — AB
Hgb urine dipstick: NEGATIVE
KETONES UR: 15 mg/dL — AB
Leukocytes, UA: NEGATIVE
NITRITE: NEGATIVE
PH: 6 (ref 5.0–8.0)
Protein, ur: NEGATIVE mg/dL
SPECIFIC GRAVITY, URINE: 1.012 (ref 1.005–1.030)

## 2015-01-16 LAB — I-STAT CHEM 8, ED
BUN: 6 mg/dL (ref 6–20)
CHLORIDE: 102 mmol/L (ref 101–111)
CREATININE: 0.8 mg/dL (ref 0.61–1.24)
Calcium, Ion: 1.06 mmol/L — ABNORMAL LOW (ref 1.12–1.23)
Glucose, Bld: 173 mg/dL — ABNORMAL HIGH (ref 65–99)
HEMATOCRIT: 52 % (ref 39.0–52.0)
Hemoglobin: 17.7 g/dL — ABNORMAL HIGH (ref 13.0–17.0)
POTASSIUM: 3.2 mmol/L — AB (ref 3.5–5.1)
Sodium: 141 mmol/L (ref 135–145)
TCO2: 25 mmol/L (ref 0–100)

## 2015-01-16 LAB — PROTIME-INR
INR: 0.99 (ref 0.00–1.49)
Prothrombin Time: 13.3 seconds (ref 11.6–15.2)

## 2015-01-16 LAB — DIFFERENTIAL
BASOS ABS: 0 10*3/uL (ref 0.0–0.1)
BASOS PCT: 0 %
Eosinophils Absolute: 0.4 10*3/uL (ref 0.0–0.7)
Eosinophils Relative: 4 %
Lymphocytes Relative: 41 %
Lymphs Abs: 4.4 10*3/uL — ABNORMAL HIGH (ref 0.7–4.0)
MONOS PCT: 9 %
Monocytes Absolute: 0.9 10*3/uL (ref 0.1–1.0)
NEUTROS ABS: 5 10*3/uL (ref 1.7–7.7)
NEUTROS PCT: 46 %

## 2015-01-16 LAB — CBC
HEMATOCRIT: 46.6 % (ref 39.0–52.0)
HEMOGLOBIN: 16.5 g/dL (ref 13.0–17.0)
MCH: 32.7 pg (ref 26.0–34.0)
MCHC: 35.4 g/dL (ref 30.0–36.0)
MCV: 92.3 fL (ref 78.0–100.0)
Platelets: 299 10*3/uL (ref 150–400)
RBC: 5.05 MIL/uL (ref 4.22–5.81)
RDW: 13.6 % (ref 11.5–15.5)
WBC: 10.8 10*3/uL — AB (ref 4.0–10.5)

## 2015-01-16 LAB — COMPREHENSIVE METABOLIC PANEL
ALBUMIN: 4.8 g/dL (ref 3.5–5.0)
ALT: 22 U/L (ref 17–63)
ANION GAP: 15 (ref 5–15)
AST: 21 U/L (ref 15–41)
Alkaline Phosphatase: 80 U/L (ref 38–126)
BUN: 8 mg/dL (ref 6–20)
CALCIUM: 9.6 mg/dL (ref 8.9–10.3)
CHLORIDE: 102 mmol/L (ref 101–111)
CO2: 22 mmol/L (ref 22–32)
Creatinine, Ser: 0.75 mg/dL (ref 0.61–1.24)
GLUCOSE: 177 mg/dL — AB (ref 65–99)
POTASSIUM: 3.3 mmol/L — AB (ref 3.5–5.1)
Sodium: 139 mmol/L (ref 135–145)
Total Bilirubin: 1 mg/dL (ref 0.3–1.2)
Total Protein: 8.3 g/dL — ABNORMAL HIGH (ref 6.5–8.1)

## 2015-01-16 LAB — I-STAT TROPONIN, ED: Troponin i, poc: 0 ng/mL (ref 0.00–0.08)

## 2015-01-16 LAB — ETHANOL: Alcohol, Ethyl (B): 63 mg/dL — ABNORMAL HIGH (ref <5)

## 2015-01-16 LAB — APTT: APTT: 26 s (ref 24–37)

## 2015-01-16 MED ORDER — ASPIRIN 81 MG PO CHEW: 81.0000 mg | CHEWABLE_TABLET | Freq: Every day | ORAL | Status: DC

## 2015-01-16 NOTE — Discharge Instructions (Signed)
You were seen for weakness and numbness. Your workup is largely reassuring. Your MRI is negative for stroke. 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 You need to follow-up with neurology and you will be started on a aspirin daily.  Paresthesia Paresthesia is an abnormal burning or prickling sensation. This sensation is generally felt in the hands, arms, legs, or feet. However, it may occur in any part of the body. Usually, it is not painful. The feeling may be described as:  Tingling or numbness.  Pins and needles.  Skin crawling.  Buzzing.  Limbs falling asleep.  Itching. Most people experience temporary (transient) paresthesia at some time in their lives. Paresthesia may occur when you breathe too quickly (hyperventilation). It can also occur without any apparent cause. Commonly, paresthesia occurs when pressure is placed on a nerve. The sensation quickly goes away after the pressure is removed. For some people, however, paresthesia is a long-lasting (chronic) condition that is caused by an underlying disorder. If you continue to have paresthesia, you may need further medical evaluation. HOME CARE INSTRUCTIONS Watch your condition for any changes. Taking the following actions may help to lessen any discomfort that you are feeling:  Avoid drinking alcohol.  Try acupuncture or massage to help relieve your symptoms.  Keep all follow-up visits as directed by your health care provider. This is important. SEEK MEDICAL CARE IF:  You continue to have episodes of paresthesia.  Your burning or prickling feeling gets worse when you walk.  You have pain, cramps, or dizziness.  You develop a rash. SEEK IMMEDIATE MEDICAL CARE IF:  You feel weak.  You have trouble walking or moving.  You have problems with speech, understanding, or vision.  You feel confused.  You cannot control your bladder or bowel movements.  You have numbness after an injury.  You faint.   This information is not intended  to replace advice given to you by your health care provider. Make sure you discuss any questions you have with your health care provider.   Document Released: 01/15/2002 Document Revised: 06/11/2014 Document Reviewed: 01/21/2014 Elsevier Interactive Patient Education 2016 [employer] Transient Ischemic Attack A transient ischemic attack (TIA) is a "warning stroke" that causes stroke-like symptoms. Unlike a stroke, a TIA does not cause permanent damage to the brain. The symptoms of a TIA can happen very fast and do not last long. It is important to know the symptoms of a TIA and what to do. This can help prevent a major stroke or death. CAUSES  A TIA is caused by a temporary blockage in an artery in the brain or neck (carotid artery). The blockage does not allow the brain to get the blood supply it needs and can cause different symptoms. The blockage can be caused by either:  A blood clot.  Fatty buildup (plaque) in a neck or brain artery. RISK FACTORS  High blood pressure (hypertension).  High cholesterol.  Diabetes mellitus.  Heart disease.  The buildup of plaque in the blood vessels (peripheral artery disease or atherosclerosis).  The buildup of plaque in the blood vessels that provide blood and oxygen to the brain (carotid artery stenosis).  An abnormal heart rhythm (atrial fibrillation).  Obesity.  Using any tobacco products, including cigarettes, chewing tobacco, or electronic cigarettes.  Taking oral contraceptives, especially in combination with using tobacco.  Physical inactivity.  A diet high in fats, salt (sodium), and calories.  Excessive alcohol use.  Use of illegal drugs (especially cocaine and methamphetamine).  Being male.  Being African American.  Being over the age of 17 years.  Family history of stroke.  Previous history of blood clots, stroke, TIA, or heart attack.  Sickle cell disease. SIGNS AND SYMPTOMS  TIA symptoms are the same as a  stroke but are temporary. These symptoms usually develop suddenly, or may be newly present upon waking from sleep:  Sudden weakness or numbness of the face, arm, or leg, especially on one side of the body.  Sudden trouble walking or difficulty moving arms or legs.  Sudden confusion.  Sudden personality changes.  Trouble speaking (aphasia) or understanding.  Difficulty swallowing.  Sudden trouble seeing in one or both eyes.  Double vision.  Dizziness.  Loss of balance or coordination.  Sudden severe headache with no known cause.  Trouble reading or writing.  Loss of bowel or bladder control.  Loss of consciousness. DIAGNOSIS  Your health care provider may be able to determine the presence or absence of a TIA based on your symptoms, history, and physical exam. CT scan of the brain is usually performed to help identify a TIA. Other tests may include:  Electrocardiography (ECG).  Continuous heart monitoring.  Echocardiography.  Carotid ultrasonography.  MRI.  A scan of the brain circulation.  Blood tests. TREATMENT  Since the symptoms of TIA are the same as a stroke, it is important to seek treatment as soon as possible. You may need a medicine to dissolve a blood clot (thrombolytic) if that is the cause of the TIA. This medicine cannot be given if too much time has passed. Treatment may also include:   Rest, oxygen, fluids through an IV tube, and medicines to thin the blood (anticoagulants).  Measures will be taken to prevent short-term and long-term complications, including infection from breathing foreign material into the lungs (aspiration pneumonia), blood clots in the legs, and falls.  Procedures to either remove plaque in the carotid arteries or dilate carotid arteries that have narrowed due to plaque. Those procedures are:  Carotid endarterectomy.  Carotid angioplasty and stenting.  Medicines and diet may be used to address diabetes, high blood pressure,  and other underlying risk factors. HOME CARE INSTRUCTIONS   Take medicines only as directed by your health care provider. Follow the directions carefully. Medicines may be used to control risk factors for a stroke. Be sure you understand all your medicine instructions.  You may be told to take aspirin or the anticoagulant warfarin. Warfarin needs to be taken exactly as instructed.  Taking too much or too little warfarin is dangerous. Too much warfarin increases the risk of bleeding. Too little warfarin continues to allow the risk for blood clots. While taking warfarin, you will need to have regular blood tests to measure your blood clotting time. A PT blood test measures how long it takes for blood to clot. Your PT is used to calculate another value called an INR. Your PT and INR help your health care provider to adjust your dose of warfarin. The dose can change for many reasons. It is critically important that you take warfarin exactly as prescribed.  Many foods, especially foods high in vitamin K can interfere with warfarin and affect the PT and INR. Foods high in vitamin K include spinach, kale, broccoli, cabbage, collard and turnip greens, Brussels sprouts, peas, cauliflower, seaweed, and parsley, as well as beef and pork liver, green tea, and soybean oil. You should eat a consistent amount of foods high in vitamin K. Avoid major changes in your diet, or  notify your health care provider before changing your diet. Arrange a visit with a dietitian to answer your questions.  Many medicines can interfere with warfarin and affect the PT and INR. You must tell your health care provider about any and all medicines you take; this includes all vitamins and supplements. Be especially cautious with aspirin and anti-inflammatory medicines. Do not take or discontinue any prescribed or over-the-counter medicine except on the advice of your health care provider or pharmacist.  Warfarin can have side effects, such  as excessive bruising or bleeding. You will need to hold pressure over cuts for longer than usual. Your health care provider or pharmacist will discuss other potential side effects.  Avoid sports or activities that may cause injury or bleeding.  Be careful when shaving, flossing your teeth, or handling sharp objects.  Alcohol can change the body's ability to handle warfarin. It is best to avoid alcoholic drinks or consume only very small amounts while taking warfarin. Notify your health care provider if you change your alcohol intake.  Notify your dentist or other health care providers before procedures.  Eat a diet that includes 5 or more servings of fruits and vegetables each day. This may reduce the risk of stroke. Certain diets may be prescribed to address high blood pressure, high cholesterol, diabetes, or obesity.  A diet low in sodium, saturated fat, trans fat, and cholesterol is recommended to manage high blood pressure.  A diet low in saturated fat, trans fat, and cholesterol, and high in fiber may control cholesterol levels.  A controlled-carbohydrate, controlled-sugar diet is recommended to manage diabetes.  A reduced-calorie diet that is low in sodium, saturated fat, trans fat, and cholesterol is recommended to manage obesity.  Maintain a healthy weight.  Stay physically active. It is recommended that you get at least 30 minutes of activity on most or all days.  Do not use any tobacco products, including cigarettes, chewing tobacco, or electronic cigarettes. If you need help quitting, ask your health care provider.  Limit alcohol intake to no more than 1 drink per day for nonpregnant women and 2 drinks per day for men. One drink equals 12 ounces of beer, 5 ounces of wine, or 1 ounces of hard liquor.  Do not abuse drugs.  A safe home environment is important to reduce the risk of falls. Your health care provider may arrange for specialists to evaluate your home. Having grab  bars in the bedroom and bathroom is often important. Your health care provider may arrange for equipment to be used at home, such as raised toilets and a seat for the shower.  Follow all instructions for follow-up with your health care provider. This is very important. This includes any referrals and lab tests. Proper follow-up can prevent a stroke or another TIA from occurring. PREVENTION  The risk of a TIA can be decreased by appropriately treating high blood pressure, high cholesterol, diabetes, heart disease, and obesity, and by quitting smoking, limiting alcohol, and staying physically active. SEEK MEDICAL CARE IF:  You have personality changes.  You have difficulty swallowing.  You are seeing double.  You have dizziness.  You have a fever. SEEK IMMEDIATE MEDICAL CARE IF:  Any of the following symptoms may represent a serious problem that is an emergency. Do not wait to see if the symptoms will go away. Get medical help right away. Call your local emergency services (911 in [place] Do not drive yourself to the hospital.  You have sudden weakness or  numbness of the face, arm, or leg, especially on one side of the body.  You have sudden trouble walking or difficulty moving arms or legs.  You have sudden confusion.  You have trouble speaking (aphasia) or understanding.  You have sudden trouble seeing in one or both eyes.  You have a loss of balance or coordination.  You have a sudden, severe headache with no known cause.  You have new chest pain or an irregular heartbeat.  You have a partial or total loss of consciousness. MAKE SURE YOU:   Understand these instructions.  Will watch your condition.  Will get help right away if you are not doing well or get worse.   This information is not intended to replace advice given to you by your health care provider. Make sure you discuss any questions you have with your health care provider.   Document Released: 11/04/2004  Document Revised: 02/15/2014 Document Reviewed: 05/02/2013 Elsevier Interactive Patient Education [employer]

## 2015-01-16 NOTE — ED Notes (Signed)
Patient c/o left arm numbness, "head numbness", "feeling like I'm going to pass out". Last seen normal approximately 20 minutes ago. History of anxiety.

## 2015-01-16 NOTE — ED Provider Notes (Signed)
CSN: [id number]     Arrival date & time 01/16/15  0150 History  By signing my name below, I, Michael [name], attest that this documentation has been prepared under the direction and in the presence of Tomasita Crumble, MD. Electronically Signed: Budd [name], ED Scribe. 01/16/2015. 2:27 AM.    No chief complaint on file.  The history is provided by the patient and a friend. No language interpreter was used.   HPI Comments: Michael [name] is a 39 y.o. male smoker at 0.5 ppd with a PMHx of TIA and anxiety who presents to the Emergency Department complaining of constant, pressure-like chest pain and left arm numbness that began 25 minutes ago while eating dinner. He reports associated SOB, difficulty swallowing, dizziness, lightheadedness, and numbness to the head. He states his most recent TIA occurred 1 month ago. He denies recent sick contacts. Pt denies n/v and difficulty urinating.   Per pt's partner, this does not seem like one of pt's usual panic attacks to him. He denies pt drinking alcohol or using drugs.   Past Medical History  Diagnosis Date  . TIA (transient ischemic attack)   . Anxiety    History reviewed. No pertinent past surgical history. No family history on file. Social History  Substance Use Topics  . Smoking status: Current Every Day Smoker -- 0.50 packs/day for 20 years    Types: Cigarettes  . Smokeless tobacco: None  . Alcohol Use: Yes     Comment: rarely    Review of Systems A complete 10 system review of systems was obtained and all systems are negative except as noted in the HPI and PMH.   Allergies  Review of patient's allergies indicates no known allergies.  Home Medications   Prior to Admission medications   Medication Sig Start Date End Date Taking? Authorizing Provider  ALPRAZolam Prudy Feeler) 1 MG tablet Take 1 mg by mouth every 4 (four) hours.   Yes Historical Provider, MD  amphetamine-dextroamphetamine (ADDERALL) 20 MG tablet Take 10-20 mg by mouth  daily.   Yes Historical Provider, MD  Melatonin 3 MG TABS Take 1 tablet by mouth at bedtime as needed (sleep).   Yes Historical Provider, MD  oxymetazoline (AFRIN) 0.05 % nasal spray Place 2 sprays into both nostrils every 4 (four) hours as needed for congestion.    Yes Historical Provider, MD  propranolol (INDERAL) 20 MG tablet Take 20 mg by mouth daily.   Yes Historical Provider, MD  vitamin B-12 (CYANOCOBALAMIN) 1000 MCG tablet Take 1,000 mcg by mouth daily.   Yes Historical Provider, MD  amoxicillin-clavulanate (AUGMENTIN) 875-125 MG per tablet Take 1 tablet by mouth every 12 (twelve) hours. Patient not taking: Reported on 01/16/2015 08/25/14   Hanna Patel-Mills, PA-C   BP 122/81 mmHg  Pulse 84  Resp 23  Ht  (1.905 m)  Wt 225 lb (102.059 kg)  BMI 28.12 kg/m2  SpO2 99% Physical Exam  Constitutional: He is oriented to person, place, and time. Vital signs are normal. He appears well-developed and well-nourished.  Non-toxic appearance. He does not appear ill. He appears distressed.  HENT:  Head: Normocephalic and atraumatic.  Nose: Nose normal.  Mouth/Throat: Oropharynx is clear and moist. No oropharyngeal exudate.  Eyes: Conjunctivae and EOM are normal. Pupils are equal, round, and reactive to light. No scleral icterus.  Neck: Normal range of motion. Neck supple. No tracheal deviation, no edema, no erythema and normal range of motion present. No thyroid mass and no thyromegaly present.  Cardiovascular:  Normal rate, regular rhythm, S1 normal, S2 normal, normal heart sounds, intact distal pulses and normal pulses.  Exam reveals no gallop and no friction rub.   No murmur heard. Pulmonary/Chest: Effort normal and breath sounds normal. No respiratory distress. He has no wheezes. He has no rhonchi. He has no rales.  Abdominal: Soft. Normal appearance and bowel sounds are normal. He exhibits no distension, no ascites and no mass. There is no hepatosplenomegaly. There is no tenderness. There is  no rebound, no guarding and no CVA tenderness.  Musculoskeletal: Normal range of motion. He exhibits no edema or tenderness.  Lymphadenopathy:    He has no cervical adenopathy.  Neurological: He is alert and oriented to person, place, and time. He has normal strength. No cranial nerve deficit or sensory deficit.  4/5 strength in LUE and LLE, 5/5 in RUE and RLE. No pronator drift bilaterally. Normal cerebellar testing.  Skin: Skin is warm, dry and intact. No petechiae and no rash noted. He is not diaphoretic. No erythema. No pallor.  Psychiatric:  Pt appears anxious  Nursing note and vitals reviewed.   ED Course  Procedures  DIAGNOSTIC STUDIES: Oxygen Saturation is 100% on RA, normal by my interpretation.    COORDINATION OF CARE: 2:11 AM - Discussed plans to order diagnostic studies and imaging to r/o a TIA. Pt advised of plan for treatment and pt agrees.  Labs Review Labs Reviewed  ETHANOL - Abnormal; Notable for the following:    Alcohol, Ethyl (B) 63 (*)    All other components within normal limits  CBC - Abnormal; Notable for the following:    WBC 10.8 (*)    All other components within normal limits  DIFFERENTIAL - Abnormal; Notable for the following:    Lymphs Abs 4.4 (*)    All other components within normal limits  COMPREHENSIVE METABOLIC PANEL - Abnormal; Notable for the following:    Potassium 3.3 (*)    Glucose, Bld 177 (*)    Total Protein 8.3 (*)    All other components within normal limits  I-STAT CHEM 8, ED - Abnormal; Notable for the following:    Potassium 3.2 (*)    Glucose, Bld 173 (*)    Calcium, Ion 1.06 (*)    Hemoglobin 17.7 (*)    All other components within normal limits  PROTIME-INR  APTT  URINE RAPID DRUG SCREEN, HOSP PERFORMED  URINALYSIS, ROUTINE W REFLEX MICROSCOPIC (NOT AT [facility])  I-STAT TROPOININ, ED    Imaging Review Ct Head Wo Contrast  01/16/2015  CLINICAL DATA:  Acute onset of left arm numbness. Initial encounter. EXAM: CT HEAD  WITHOUT CONTRAST TECHNIQUE: Contiguous axial images were obtained from the base of the skull through the vertex without intravenous contrast. COMPARISON:  CT of the head performed 03/11/2014 FINDINGS: There is no evidence of acute infarction, mass lesion, or intra- or extra-axial hemorrhage on CT. The posterior fossa, including the cerebellum, brainstem and fourth ventricle, is within normal limits. The third and lateral ventricles, and basal ganglia are unremarkable in appearance. The cerebral hemispheres are symmetric in appearance, with normal gray-white differentiation. No mass effect or midline shift is seen. There is no evidence of fracture; visualized osseous structures are unremarkable in appearance. The orbits are within normal limits. The paranasal sinuses and mastoid air cells are well-aerated. No significant soft tissue abnormalities are seen. IMPRESSION: Unremarkable noncontrast CT of the head. These results were called by telephone at the time of interpretation on 01/16/2015 at 2:42 am to  Dr. Tomasita CrumbleADELEKE Riely Oetken, who verbally acknowledged these results. Electronically Signed   By: Roanna RaiderJeffery  Chang M.D.   On: 01/16/2015 02:42   I have personally reviewed and evaluated these images and lab results as part of my medical decision-making.   EKG Interpretation None      MDM   Final diagnoses:  None    Patient presents to the emergency department for strokelike symptoms. He states he cannot move his left upper and lower extremity. He is able to move the side however it appears weaker than the right. His exam is conflicting and that he has no pronator drift on either side. He does have history of anxiety. This may be anxiety versus repeat TIA. Code stroke was called, CT head is negative. [facility].   I personally performed the services described in this documentation, which was scribed in my presence. The recorded information has been reviewed and is  accurate.     Tomasita CrumbleAdeleke Hameed Kolar, MD 01/16/15 [phone]

## 2015-01-16 NOTE — Consult Note (Signed)
Stroke Consult Consulting Physician: Dr Mora Bellman ED  Chief Complaint: le HPI: Michael [name] is an 39 y.o. male hx of anxiety, reported TIAs presenting to ED with acute onset of left sided numbness. LSW 0130 when he was eating. He acutely developed chest pain and left sided numbness/paresthesias. Patient denies any weakness, speech or visual deficits. He does endorse a light headed sensation and palpitations. Notes difficulty swallowing. He reports these symptoms are different from his prior "TIAs". He reports 4 prior episodes which have varied and include bilateral LE weakness.   CT head imaging and stat DWI imaging reviewed and both negative for acute process.   Date last known well: 01/16/2015 Time last known well: 0130 tPA Given: no, too mild to treat, MRI negative for acute stroke Modified Rankin: Rankin Score=0  Past Medical History  Diagnosis Date  . TIA (transient ischemic attack)   . Anxiety     History reviewed. No pertinent past surgical history.  No family history on file. Social History:  reports that he has been smoking Cigarettes.  He has a 10 pack-year smoking history. He does not have any smokeless tobacco history on file. He reports that he drinks alcohol. He reports that he does not use illicit drugs.  Allergies: No Known Allergies   (Not in a hospital admission)  ROS: Out of a complete 14 system review, the patient complains of only the following symptoms, and all other reviewed systems are negative. +paresthesias  Physical Examination: Filed Vitals:   01/16/15 0235 01/16/15 0236  BP: 122/81   Pulse:  84  Resp:  23   Physical Exam  Constitutional: He appears well-developed and well-nourished.  Psych: Affect appropriate to situation Eyes: No scleral injection HENT: No OP obstrucion Head: Normocephalic.  Cardiovascular: Normal rate and regular rhythm.  Respiratory: Effort normal and breath sounds normal.  GI: Soft. Bowel sounds are normal. No  distension. There is no tenderness.  Skin: WDI  Neurologic Examination: Mental Status: Alert, oriented, thought content appropriate. Appears anxious, tearful during part of the exam. Speech fluent without evidence of aphasia.  No dysarthria. Able to follow 3 step commands without difficulty. Cranial Nerves: II: funduscopic exam wnl bilaterally, visual fields grossly normal, pupils equal, round, reactive to light  III,IV, VI: ptosis not present, extra-ocular motions intact bilaterally V,VII: smile symmetric, diminished LT on the left side, splits the midline VIII: hearing normal bilaterally IX,X: gag reflex present XI: trapezius strength/neck flexion strength normal bilaterally XII: tongue strength normal  Motor: Right : Upper extremity    Left:     Upper extremity 5/5 deltoid       5/5 deltoid 5/5 biceps      5/5 biceps  5/5 triceps      5/5 triceps 5/5 hand grip      5/5 hand grip  Lower extremity     Lower extremity 5/5 hip flexor      5/5 hip flexor 5/5 quadricep      5/5 quadriceps  5/5 hamstrings     5/5 hamstrings 5/5 plantar flexion       5/5 plantar flexion 5/5 plantar extension     5/5 plantar extension Tone and bulk:normal tone throughout; no atrophy noted Sensory: subjective sensory deficits on the left  Deep Tendon Reflexes: 2+ and symmetric throughout Plantars: Right: downgoing   Left: downgoing Cerebellar: normal finger-to-nose, and normal heel-to-shin test Gait: deferred  Laboratory Studies:   Basic Metabolic Panel:  Recent Labs Lab 01/16/15 0218 01/16/15 0227  NA 139  141  K 3.3* 3.2*  CL 102 102  CO2 22  --   GLUCOSE 177* 173*  BUN 8 6  CREATININE 0.75 0.80  CALCIUM 9.6  --     Liver Function Tests:  Recent Labs Lab 01/16/15 0218  AST 21  ALT 22  ALKPHOS 80  BILITOT 1.0  PROT 8.3*  ALBUMIN 4.8   No results for input(s): LIPASE, AMYLASE in the last 168 hours. No results for input(s): AMMONIA in the last 168 hours.  CBC:  Recent  Labs Lab 01/16/15 0218 01/16/15 0227  WBC 10.8*  --   NEUTROABS 5.0  --   HGB 16.5 17.7*  HCT 46.6 52.0  MCV 92.3  --   PLT 299  --     Cardiac Enzymes: No results for input(s): CKTOTAL, CKMB, CKMBINDEX, TROPONINI in the last 168 hours.  BNP: Invalid input(s): POCBNP  CBG: No results for input(s): GLUCAP in the last 168 hours.  Microbiology: Results for orders placed or performed during the hospital encounter of 09/17/13  GC/Chlamydia Probe Amp     Status: None   Collection Time: 09/17/13  9:40 AM  Result Value Ref Range Status   CT Probe RNA NEGATIVE NEGATIVE Final   GC Probe RNA NEGATIVE NEGATIVE Final    Comment: (NOTE)                                                                                       **Normal Reference Range: Negative**      Assay performed using the Gen-Probe APTIMA COMBO2 (R) Assay. Acceptable specimen types for this assay include APTIMA Swabs (Unisex, endocervical, urethral, or vaginal), first void urine, and ThinPrep liquid based cytology samples. Performed at [facility]    Coagulation Studies:  Recent Labs  01/16/15 0218  LABPROT 13.3  INR 0.99    Urinalysis: No results for input(s): COLORURINE, LABSPEC, PHURINE, GLUCOSEU, HGBUR, BILIRUBINUR, KETONESUR, PROTEINUR, UROBILINOGEN, NITRITE, LEUKOCYTESUR in the last 168 hours.  Invalid input(s): APPERANCEUR  Lipid Panel:  No results found for: CHOL, TRIG, HDL, CHOLHDL, VLDL, LDLCALC  HgbA1C: No results found for: HGBA1C  Urine Drug Screen:  No results found for: LABOPIA, COCAINSCRNUR, LABBENZ, AMPHETMU, THCU, LABBARB  Alcohol Level:  Recent Labs Lab 01/16/15 0218  ETH 63*    Other results:  Imaging: Ct Head Wo Contrast  01/16/2015  CLINICAL DATA:  Acute onset of left arm numbness. Initial encounter. EXAM: CT HEAD WITHOUT CONTRAST TECHNIQUE: Contiguous axial images were obtained from the base of the skull through the vertex without intravenous contrast. COMPARISON:   CT of the head performed 03/11/2014 FINDINGS: There is no evidence of acute infarction, mass lesion, or intra- or extra-axial hemorrhage on CT. The posterior fossa, including the cerebellum, brainstem and fourth ventricle, is within normal limits. The third and lateral ventricles, and basal ganglia are unremarkable in appearance. The cerebral hemispheres are symmetric in appearance, with normal gray-white differentiation. No mass effect or midline shift is seen. There is no evidence of fracture; visualized osseous structures are unremarkable in appearance. The orbits are within normal limits. The paranasal sinuses and mastoid air cells are well-aerated. No significant soft tissue abnormalities  are seen. IMPRESSION: Unremarkable noncontrast CT of the head. These results were called by telephone at the time of interpretation on 01/16/2015 at 2:42 am to Dr. Tomasita Crumble, who verbally acknowledged these results. Electronically Signed   By: Roanna Raider M.D.   On: 01/16/2015 02:42    Assessment: 39 y.o. male hx of anxiety, reported TIAs presenting with acute onset of chest pain and left sided paresthesias. Findings are atypical with subjective sensory deficits on the left side that split the midline. DWI imaging negative for acute stroke. Suspect anxiety playing a role in his presentation.   -start ASA  daily -no further inpatient neurological workup indicated at this time -can follow up with outpatient neurology   Elspeth Cho, DO [facility] [phone]  If 7pm- 7am, please page neurology on call as listed in AMION. 01/16/2015, 3:30 AM

## 2015-01-16 NOTE — ED Notes (Addendum)
Emergency contact  [employer] [employer] [phone]

## 2015-01-16 NOTE — ED Notes (Signed)
Patient going straight to MRI with RR RN and Stroke MD.

## 2015-01-16 NOTE — Code Documentation (Signed)
Mr. Clinton QuantSherrard is a 39yo wm [facility] after developing sudden onset Lt side weakness & numbness/tingling.  He reports that he has a hx of TIA though he is unable to state which hospital he was seen by that gave him the dx.  He was up eating when he developed the new symptoms.  [facility]  [facility]. Code stroke canceled at 0339.

## 2015-01-16 NOTE — ED Notes (Signed)
Patient transported to CT 

## 2015-01-16 NOTE — ED Notes (Signed)
[facility] here to transport the pt

## 2015-01-16 NOTE — ED Notes (Signed)
Pt left with all his belongings and ambulated out of the treatment area.  

## 2015-01-16 NOTE — ED Provider Notes (Signed)
[facility] as [facility] code stroke. Presented with numbness and weakness in the left upper extremity as well as chest pain. He was taken emergently to MRI. He was evaluated by neurology upon arrival. He was initially evaluated by Dr. Mora Bellmanni.  Please see his note for specific details.  MRI is negative for stroke. Neurology feels patient symptoms are likely related to anxiety. Given history of reported TIAs, he will be discharged with aspirin. On recheck, patient reports that he still feels "fuzzy headed." His EKG and troponin are also negative. Patient will be given neurology follow-up. He ambulated without difficulty.   EKG Interpretation  Date/Time:  Thursday January 16 2015 03:30:58 EST Ventricular Rate:  92 PR Interval:  132 QRS Duration: 101 QT Interval:  368 QTC Calculation: 455 R Axis:   55 Text Interpretation:  Sinus rhythm Nonspecific T abnrm, anterolateral leads Confirmed by HORTON  MD, Toni AmendOURTNEY ([id number]) on 01/16/2015 6:17:17 AM       After history, exam, and medical workup I feel the patient has been appropriately medically screened and is safe for discharge home. Pertinent diagnoses were discussed with the patient. Patient was given return precautions.   Shon Batonourtney F Horton, MD 01/16/15 [phone]

## 2015-01-29 ENCOUNTER — Emergency Department (HOSPITAL_COMMUNITY): Payer: Self-pay

## 2015-01-29 ENCOUNTER — Encounter (HOSPITAL_COMMUNITY): Payer: Self-pay | Admitting: *Deleted

## 2015-01-29 ENCOUNTER — Emergency Department (HOSPITAL_COMMUNITY)
Admission: EM | Admit: 2015-01-29 | Discharge: 2015-01-29 | Disposition: A | Discharge status: Home / Self Care | Payer: Self-pay | Attending: Emergency Medicine | Admitting: Emergency Medicine

## 2015-01-29 DIAGNOSIS — M545 Low back pain, unspecified: Secondary | ICD-10-CM

## 2015-01-29 DIAGNOSIS — N433 Hydrocele, unspecified: Secondary | ICD-10-CM | POA: Insufficient documentation

## 2015-01-29 DIAGNOSIS — R52 Pain, unspecified: Secondary | ICD-10-CM

## 2015-01-29 DIAGNOSIS — Z87828 Personal history of other (healed) physical injury and trauma: Secondary | ICD-10-CM | POA: Insufficient documentation

## 2015-01-29 DIAGNOSIS — Z7982 Long term (current) use of aspirin: Secondary | ICD-10-CM | POA: Insufficient documentation

## 2015-01-29 DIAGNOSIS — F419 Anxiety disorder, unspecified: Secondary | ICD-10-CM | POA: Insufficient documentation

## 2015-01-29 DIAGNOSIS — Z8673 Personal history of transient ischemic attack (TIA), and cerebral infarction without residual deficits: Secondary | ICD-10-CM | POA: Insufficient documentation

## 2015-01-29 DIAGNOSIS — K4020 Bilateral inguinal hernia, without obstruction or gangrene, not specified as recurrent: Secondary | ICD-10-CM | POA: Insufficient documentation

## 2015-01-29 DIAGNOSIS — Z79899 Other long term (current) drug therapy: Secondary | ICD-10-CM | POA: Insufficient documentation

## 2015-01-29 DIAGNOSIS — F17210 Nicotine dependence, cigarettes, uncomplicated: Secondary | ICD-10-CM | POA: Insufficient documentation

## 2015-01-29 MED ORDER — ONDANSETRON 4 MG PO TBDP
4.0000 mg | ORAL_TABLET | Freq: Once | ORAL | Status: AC
  Administered 2015-01-29: 4 mg via ORAL
  Filled 2015-01-29: qty 1

## 2015-01-29 MED ORDER — OXYCODONE HCL 5 MG PO TABS: 2.5000 mg | ORAL_TABLET | Freq: Four times a day (QID) | ORAL | Status: DC | PRN

## 2015-01-29 MED ORDER — OXYCODONE-ACETAMINOPHEN 5-325 MG PO TABS
2.0000 | ORAL_TABLET | Freq: Once | ORAL | Status: AC
Start: 2015-01-29 — End: 2015-01-29
  Administered 2015-01-29: 2 via ORAL
  Filled 2015-01-29: qty 2

## 2015-01-29 NOTE — ED Provider Notes (Signed)
CSN: [id number]     Arrival date & time 01/29/15  1708 History   First MD Initiated Contact with Patient 01/29/15 1921     Chief Complaint  Patient presents with  . Testicle Pain  . Back Pain     (Consider location/radiation/quality/duration/timing/severity/associated sxs/prior Treatment) HPI GOTHAM RADEN This 39 year old male who presents emergency Department with chief complaint of low back pain and testicle pain. Patient was seen at [facility] 3 days ago. He had a CT scan of his abdomen and an ultrasound of his scrotum. He was diagnosed with lumbar strain, bilateral inguinal hernias, and hydrocele. Patient states that the medication he was given was too expensive and he is unable to fill it. He has had severe pain. He states he is unable to work due to his pain. Pain is worse with movement, walking and coughing. He denies any penile discharge. He denies urinary symptoms. Pain in his lower back is better with pressure and deep palpation. Worse with movement and bending. Past Medical History  Diagnosis Date  . TIA (transient ischemic attack)   . Anxiety    History reviewed. No pertinent past surgical history. No family history on file. Social History  Substance Use Topics  . Smoking status: Current Every Day Smoker -- 0.50 packs/day for 20 years    Types: Cigarettes  . Smokeless tobacco: None  . Alcohol Use: Yes     Comment: rarely    Review of Systems  Ten systems reviewed and are negative for acute change, except as noted in the HPI.    Allergies  Review of patient's allergies indicates no known allergies.  Home Medications   Prior to Admission medications   Medication Sig Start Date End Date Taking? Authorizing Provider  ALPRAZolam Prudy Feeler) 1 MG tablet Take 1 mg by mouth every 4 (four) hours.    Historical Provider, MD  amoxicillin-clavulanate (AUGMENTIN) 875-125 MG per tablet Take 1 tablet by mouth every 12 (twelve) hours. Patient not taking: Reported on  01/16/2015 08/25/14   Catha Gosselin, PA-C  amphetamine-dextroamphetamine (ADDERALL) 20 MG tablet Take 10-20 mg by mouth daily.    Historical Provider, MD  aspirin 81 MG chewable tablet Chew 1 tablet (81 mg total) by mouth daily. 01/16/15   Shon Baton, MD  Melatonin 3 MG TABS Take 1 tablet by mouth at bedtime as needed (sleep).    Historical Provider, MD  oxyCODONE (OXY IR/ROXICODONE) 5 MG immediate release tablet Take 0.5-1 tablets (2.5-5 mg total) by mouth every 6 (six) hours as needed for severe pain. 01/29/15   Arthor Captain, PA-C  oxymetazoline (AFRIN) 0.05 % nasal spray Place 2 sprays into both nostrils every 4 (four) hours as needed for congestion.     Historical Provider, MD  propranolol (INDERAL) 20 MG tablet Take 20 mg by mouth daily.    Historical Provider, MD  vitamin B-12 (CYANOCOBALAMIN) 1000 MCG tablet Take 1,000 mcg by mouth daily.    Historical Provider, MD   BP 150/87 mmHg  Pulse 106  Temp(Src) 98.2 F (36.8 C) (Oral)  Resp 16  SpO2 100% Physical Exam  Constitutional: He appears well-developed and well-nourished. No distress.  HENT:  Head: Normocephalic and atraumatic.  Eyes: Conjunctivae are normal. No scleral icterus.  Neck: Normal range of motion. Neck supple.  Cardiovascular: Normal rate, regular rhythm and normal heart sounds.   Pulmonary/Chest: Effort normal and breath sounds normal. No respiratory distress.  Abdominal: Soft. He exhibits no distension. There is no tenderness. A hernia is present.  Hernia confirmed positive in the right inguinal area and confirmed positive in the left inguinal area.  Genitourinary:    Cremasteric reflex is absent on the right side. Cremasteric reflex is absent on the left side. Circumcised.  Musculoskeletal: He exhibits no edema.  Pain across the sacrum and glutes with palpation. Pain is worse with movement.   Neurological: He is alert.  Skin: Skin is warm and dry. He is not diaphoretic.  Psychiatric: His behavior is  normal.  Nursing note and vitals reviewed.   ED Course  Procedures (including critical care time) Labs Review Labs Reviewed - No data to display  Imaging Review No results found. I have personally reviewed and evaluated these images and lab results as part of my medical decision-making.   EKG Interpretation None      MDM   Final diagnoses:  Bilateral inguinal hernia without obstruction or gangrene, recurrence not specified  Bilateral low back pain without sciatica    Patient will be discharged with change in pain meds. F/u with ccs. Patient with back pain.  No neurological deficits and normal neuro exam.  Patient can walk but states is painful.  No loss of bowel or bladder control.  No concern for cauda equina.  No fever, night sweats, weight loss, h/o cancer, IVDU.  RICE protocol and pain medicine indicated and discussed with patient.      Arthor Captainbigail Cierah Crader, PA-C 01/29/15 2003  Laurence Spatesachel Morgan Little, MD 01/30/15 [phone]

## 2015-01-29 NOTE — ED Notes (Signed)
Pt complains of worsening testicle pain radiating to his groin since last Thursday. Pt states testicles are tender to touch. Pt states the pain is worse when urinating/coughing. Pt also complains of back pain for 1.5 weeks that is alleviated by putting pressure on the sore area. Pt denies nausea/vomiting/diarrhea.

## 2015-01-29 NOTE — Discharge Instructions (Signed)

## 2016-04-11 IMAGING — MR MR HEAD W/O CM
2 series · 48 of 48 positions shown · non-contrast
Comparison: Prior head CT from earlier the same day.

CLINICAL DATA: Initial evaluation for acute left-sided weakness,
difficulty swallowing.

EXAM:
MRI HEAD WITHOUT CONTRAST
TECHNIQUE: Multiplanar, multiecho pulse sequences of the brain and surrounding
structures were obtained without intravenous contrast.

[Series 3: DWI · axial · 3.0mm · 1.09mm/px · z∈[-38,+105]mm · 32 of 98 slices shown (1 of 2)]
[im 1/98]
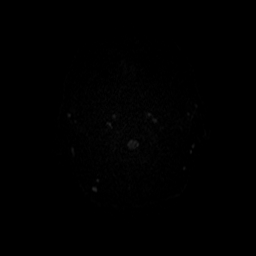
[im 4/98]
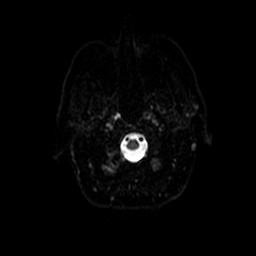
[im 7/98]
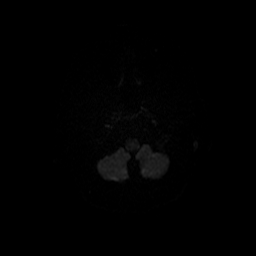
[im 10/98]
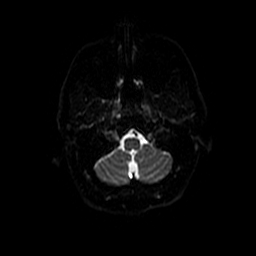
[im 13/98]
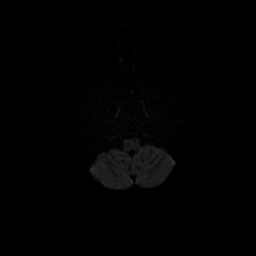
[im 16/98]
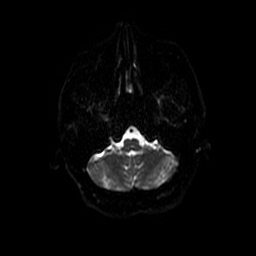
[im 19/98]
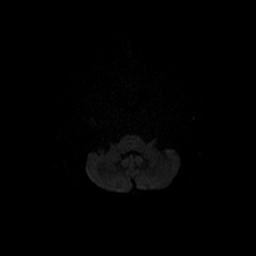
[im 22/98]
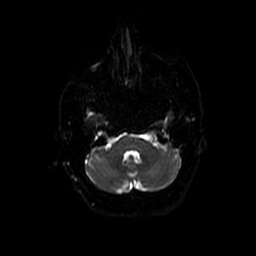
[im 26/98]
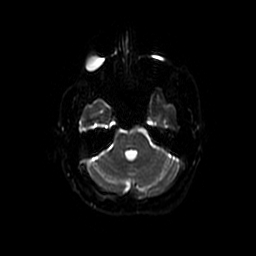
[im 29/98]
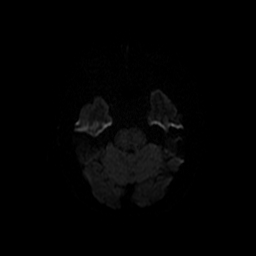
[im 32/98]
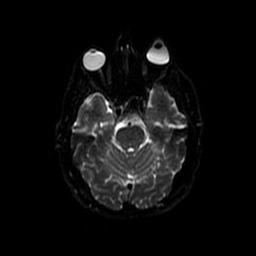
[im 35/98]
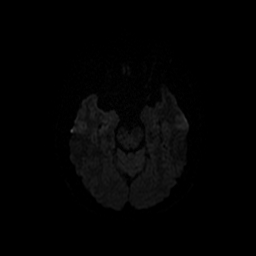
[im 38/98]
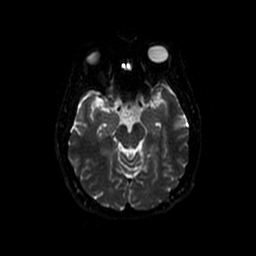
[im 41/98]
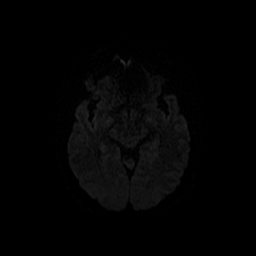
[im 44/98]
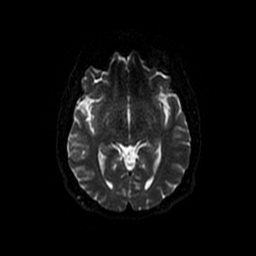
[im 47/98]
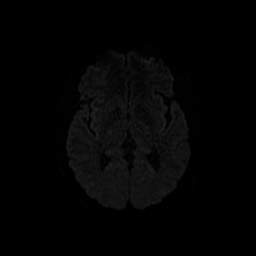
[im 51/98]
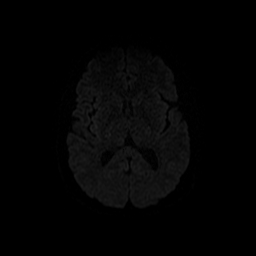
[im 54/98]
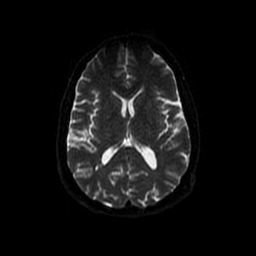
[im 57/98]
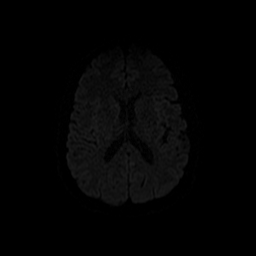
[im 60/98]
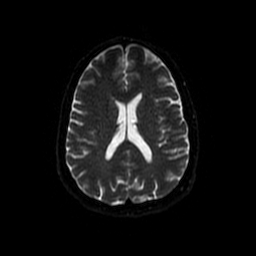
[im 63/98]
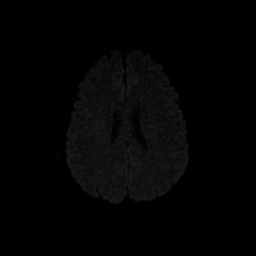
[im 66/98]
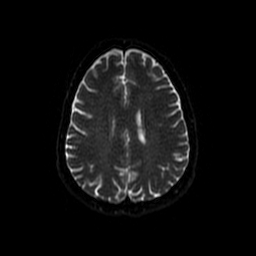
[im 69/98]
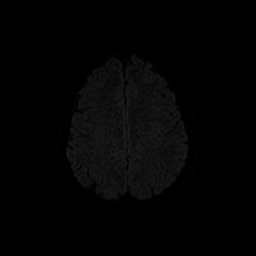
[im 72/98]
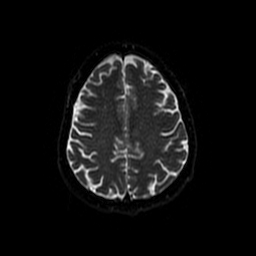
[im 76/98]
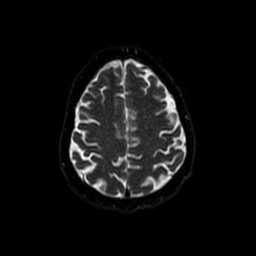
[im 79/98]
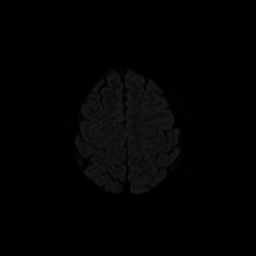
[im 82/98]
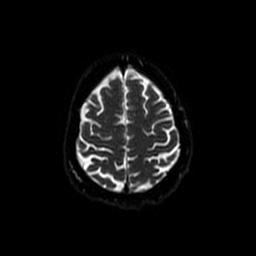
[im 85/98]
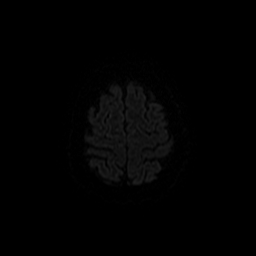
[im 88/98]
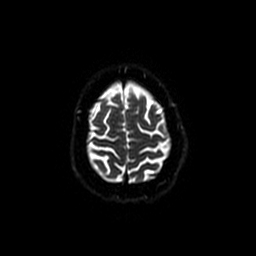
[im 91/98]
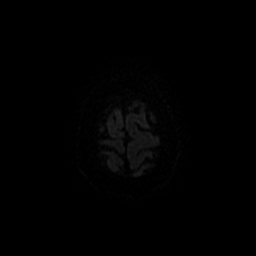
[im 94/98]
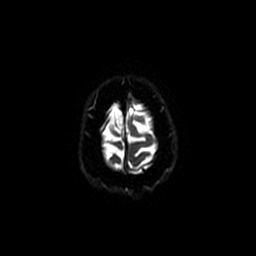
[im 98/98]
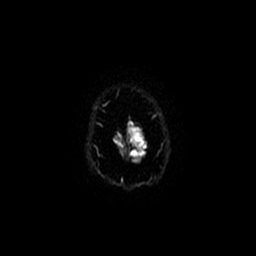

[Series 300: DWI · axial · 3.0mm · 1.09mm/px · z∈[-38,+105]mm · 16 of 49 slices shown (2 of 2)]
[im 1/49]
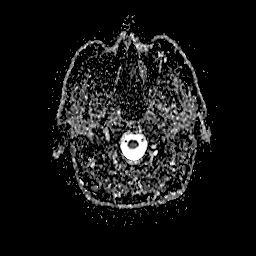
[im 4/49]
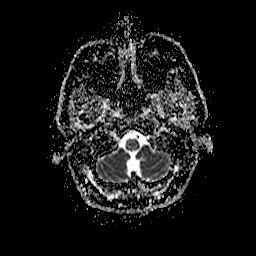
[im 7/49]
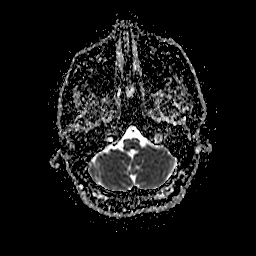
[im 10/49]
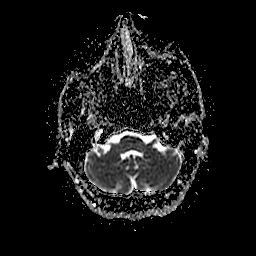
[im 13/49]
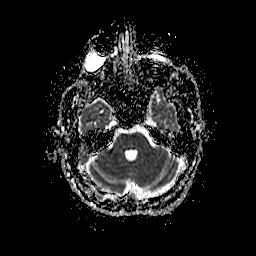
[im 17/49]
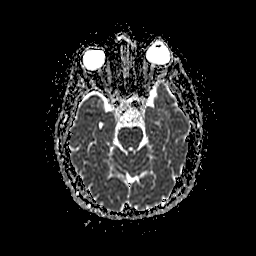
[im 20/49]
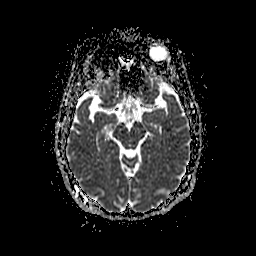
[im 23/49]
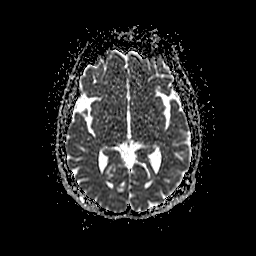
[im 26/49]
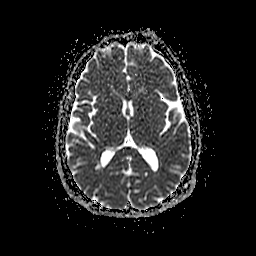
[im 29/49]
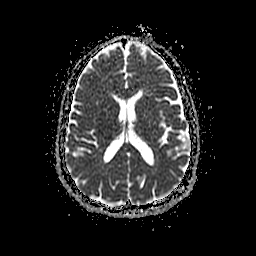
[im 33/49]
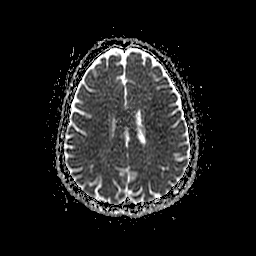
[im 36/49]
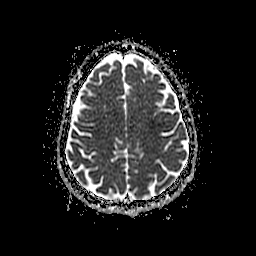
[im 39/49]
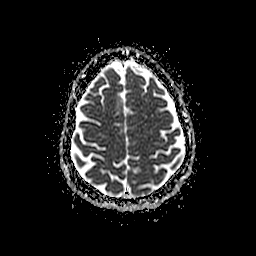
[im 42/49]
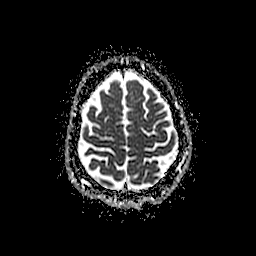
[im 45/49]
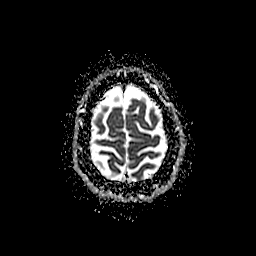
[im 49/49]
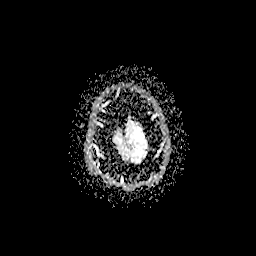

[48 of 48 positions shown; findings below may reference images not displayed]

FINDINGS: Study is limited as only axial diffusion-weighted sequences were
performed.

No abnormal foci of restricted diffusion to suggest acute
intracranial infarct. Gray-white matter differentiation grossly
maintained. No midline shift or mass effect. No hydrocephalus. No
extra-axial fluid collection.
IMPRESSION: 1. Limited study with only axial diffusion-weighted sequences
performed.
2. No acute intracranial infarct identified. No definite other acute
intracranial process.

## 2016-04-11 IMAGING — CT CT HEAD W/O CM
2 series · 15 of 30 positions shown, 19 images · non-contrast
Comparison: CT of the head performed 03/11/2014

CLINICAL DATA: Acute onset of left arm numbness. Initial encounter.

EXAM:
CT HEAD WITHOUT CONTRAST
TECHNIQUE: Contiguous axial images were obtained from the base of the skull
through the vertex without intravenous contrast.

[Series 2: head w/o · axial · non-contrast · 0.45mm/px · z∈[+1426,+1566]mm · 13 of 34 slices shown, 17 images]
[im 3/34  brain]
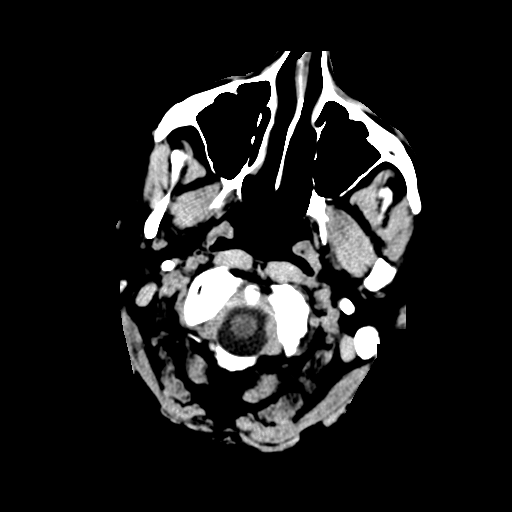
[im 3/34  bone]
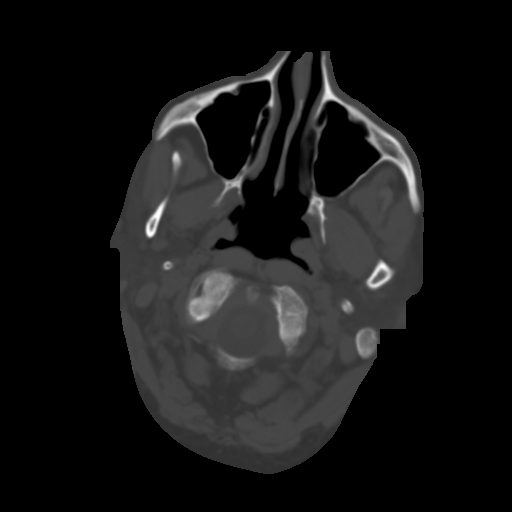
[im 5/34  brain]
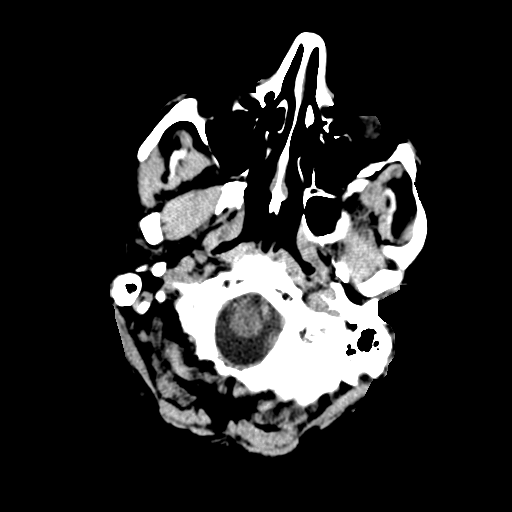
[im 8/34  brain]
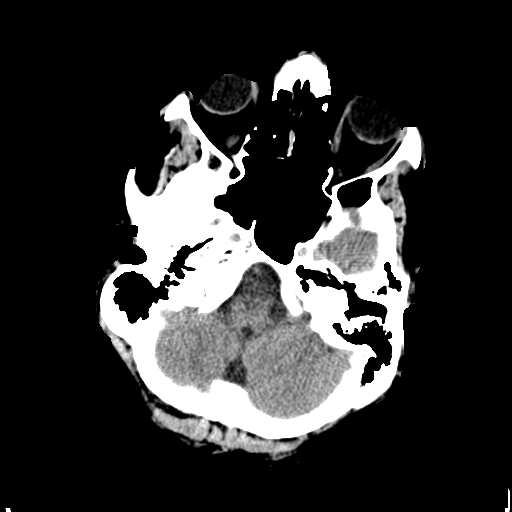
[im 10/34  brain]
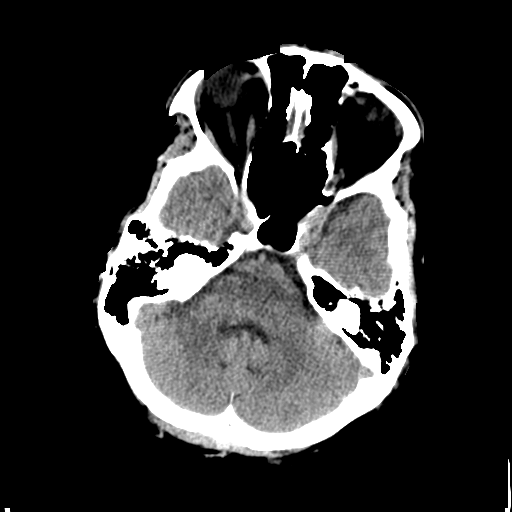
[im 12/34  brain]
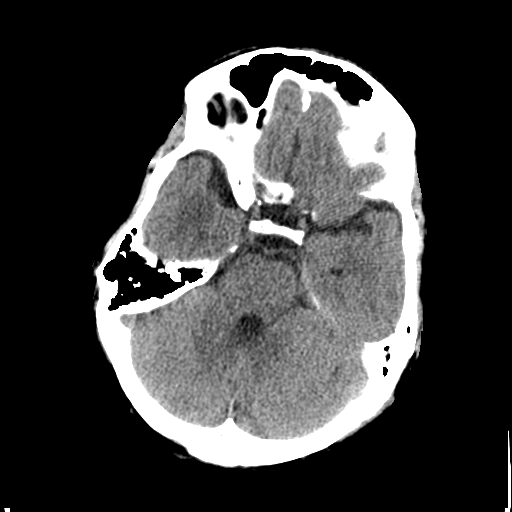
[im 12/34  bone]
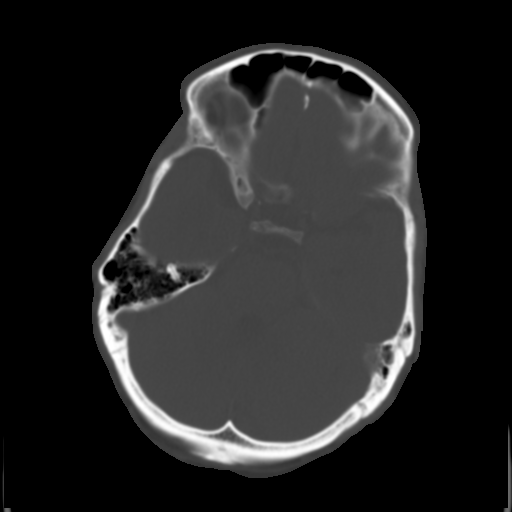
[im 15/34  brain]
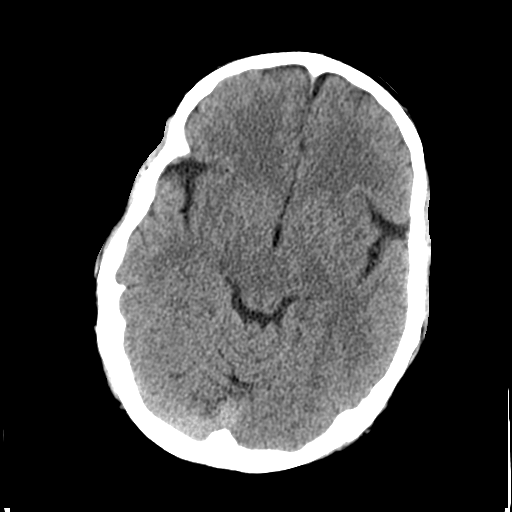
[im 17/34  brain]
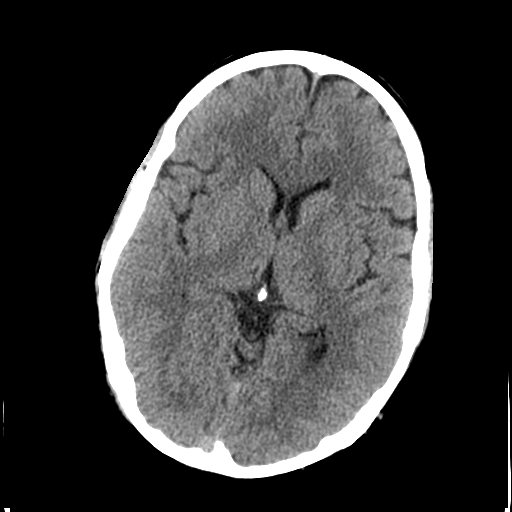
[im 19/34  brain]
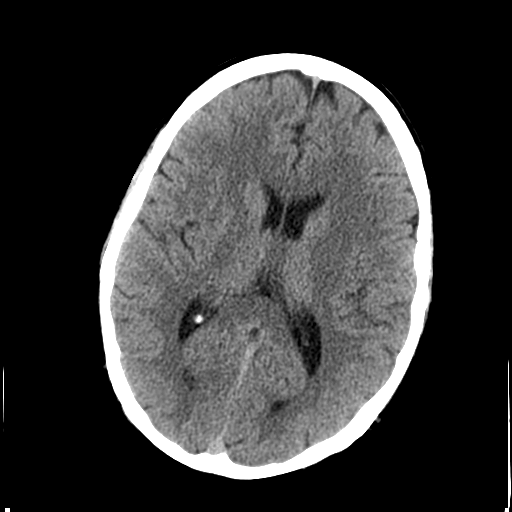
[im 22/34  brain]
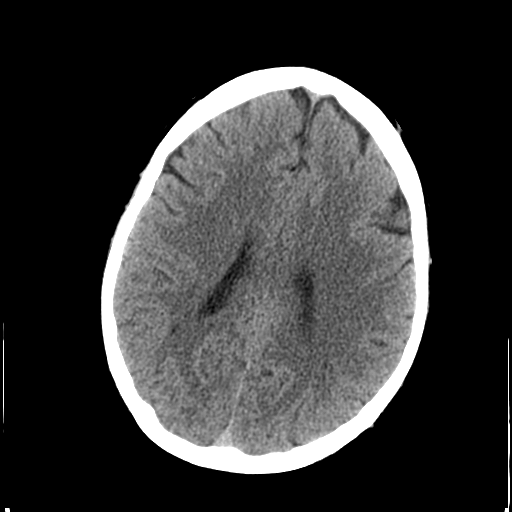
[im 22/34  bone]
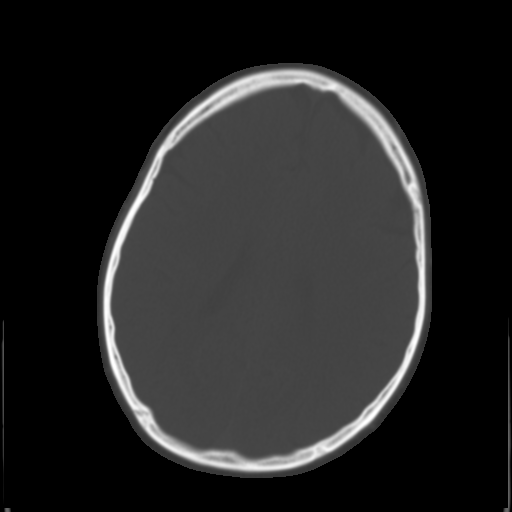
[im 24/34  brain]
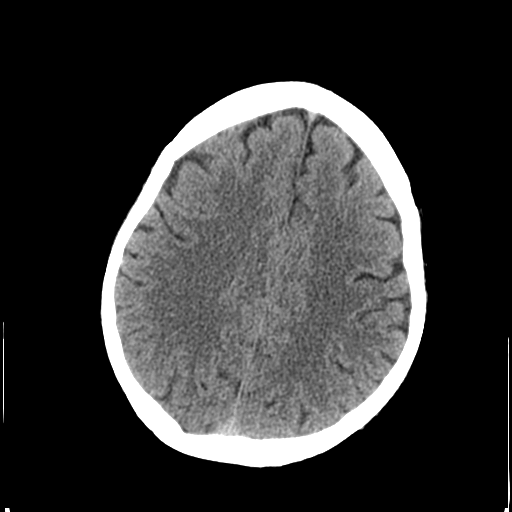
[im 26/34  brain]
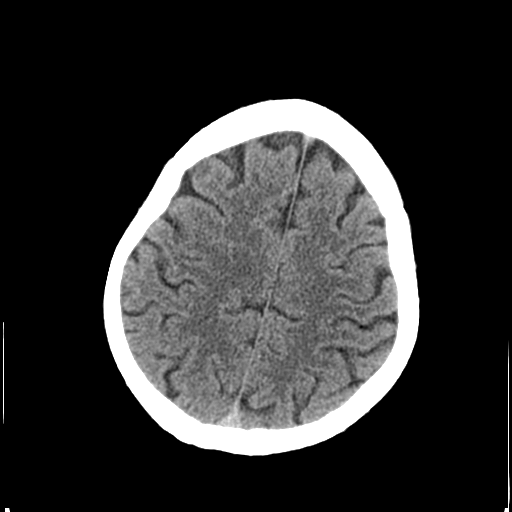
[im 29/34  brain]
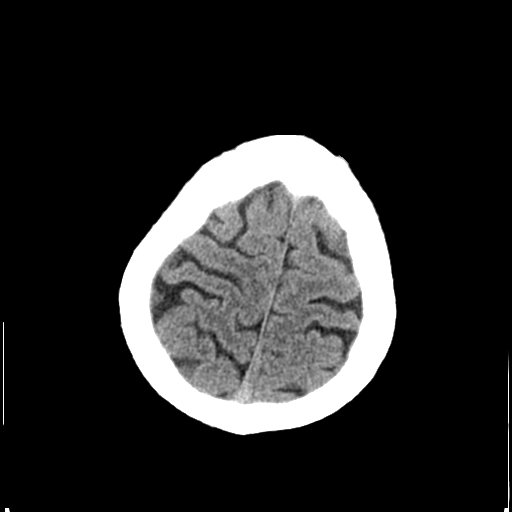
[im 31/34  brain]
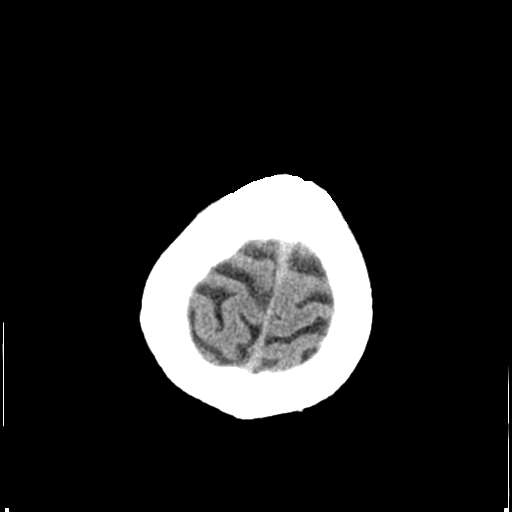
[im 31/34  bone]
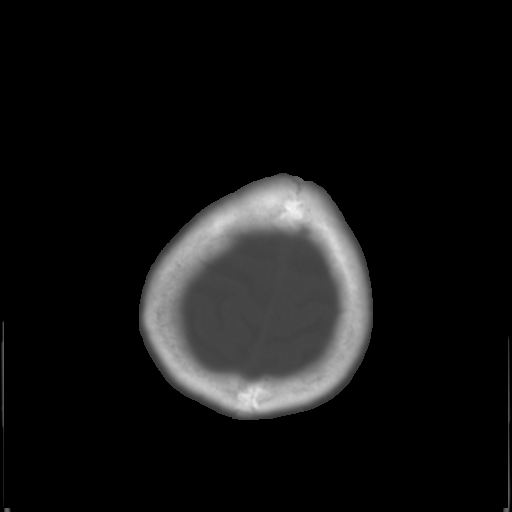

[Series 3: bone windows · axial · 0.45mm/px · z∈[+1426,+1451]mm · 2 of 34 slices shown]
[im 3/34  bone]
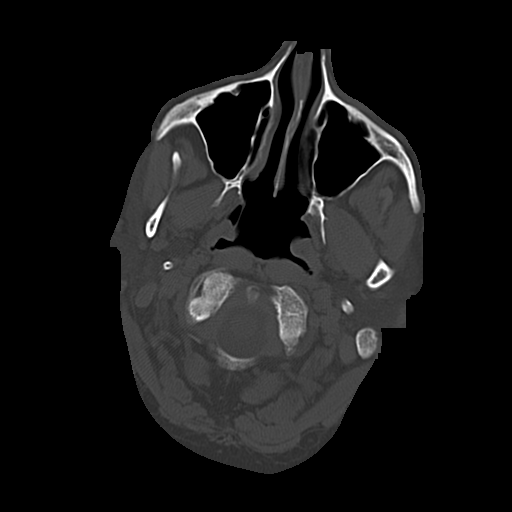
[im 8/34  bone]
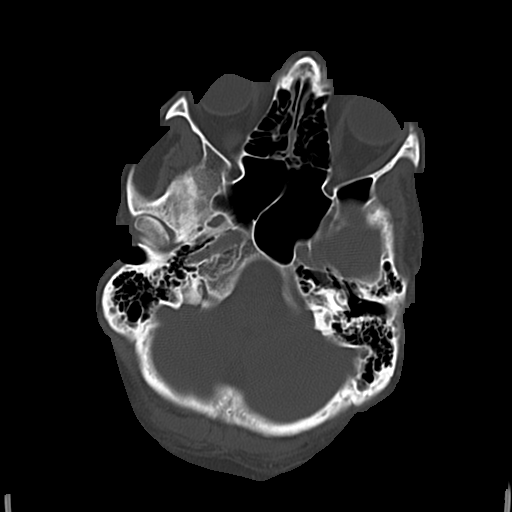

[15 of 30 positions shown; findings below may reference images not displayed]

FINDINGS: There is no evidence of acute infarction, mass lesion, or intra- or
extra-axial hemorrhage on CT.

The posterior fossa, including the cerebellum, brainstem and fourth
ventricle, is within normal limits. The third and lateral
ventricles, and basal ganglia are unremarkable in appearance. The
cerebral hemispheres are symmetric in appearance, with normal
gray-white differentiation. No mass effect or midline shift is seen.

There is no evidence of fracture; visualized osseous structures are
unremarkable in appearance. The orbits are within normal limits. The
paranasal sinuses and mastoid air cells are well-aerated. No
significant soft tissue abnormalities are seen.
IMPRESSION: Unremarkable noncontrast CT of the head.

These results were called by telephone at the time of interpretation
on 01/16/2015 at [DATE] to Dr. MAFYOUZI JONI, who verbally
acknowledged these results.

## 2021-09-14 ENCOUNTER — Encounter (HOSPITAL_COMMUNITY): Payer: Self-pay | Admitting: Emergency Medicine

## 2021-09-14 ENCOUNTER — Emergency Department (HOSPITAL_COMMUNITY)
Admission: EM | Admit: 2021-09-14 | Discharge: 2021-09-15 | Disposition: A | Discharge status: Home / Self Care | Payer: 59 | Attending: Emergency Medicine | Admitting: Emergency Medicine

## 2021-09-14 DIAGNOSIS — F332 Major depressive disorder, recurrent severe without psychotic features: Secondary | ICD-10-CM | POA: Diagnosis present

## 2021-09-14 DIAGNOSIS — Z7982 Long term (current) use of aspirin: Secondary | ICD-10-CM | POA: Insufficient documentation

## 2021-09-14 DIAGNOSIS — Z20822 Contact with and (suspected) exposure to covid-19: Secondary | ICD-10-CM | POA: Diagnosis not present

## 2021-09-14 DIAGNOSIS — G5631 Lesion of radial nerve, right upper limb: Secondary | ICD-10-CM

## 2021-09-14 DIAGNOSIS — Z5900 Homelessness unspecified: Secondary | ICD-10-CM | POA: Insufficient documentation

## 2021-09-14 DIAGNOSIS — F329 Major depressive disorder, single episode, unspecified: Secondary | ICD-10-CM | POA: Insufficient documentation

## 2021-09-14 DIAGNOSIS — F419 Anxiety disorder, unspecified: Secondary | ICD-10-CM | POA: Diagnosis present

## 2021-09-14 DIAGNOSIS — F32A Depression, unspecified: Secondary | ICD-10-CM

## 2021-09-14 LAB — CBC WITH DIFFERENTIAL/PLATELET
Abs Immature Granulocytes: 0.01 10*3/uL (ref 0.00–0.07)
Basophils Absolute: 0 10*3/uL (ref 0.0–0.1)
Basophils Relative: 1 %
Eosinophils Absolute: 0.1 10*3/uL (ref 0.0–0.5)
Eosinophils Relative: 2 %
HCT: 39.7 % (ref 39.0–52.0)
Hemoglobin: 13.7 g/dL (ref 13.0–17.0)
Immature Granulocytes: 0 %
Lymphocytes Relative: 28 %
Lymphs Abs: 1.6 10*3/uL (ref 0.7–4.0)
MCH: 30.9 pg (ref 26.0–34.0)
MCHC: 34.5 g/dL (ref 30.0–36.0)
MCV: 89.6 fL (ref 80.0–100.0)
Monocytes Absolute: 0.4 10*3/uL (ref 0.1–1.0)
Monocytes Relative: 6 %
Neutro Abs: 3.6 10*3/uL (ref 1.7–7.7)
Neutrophils Relative %: 63 %
Platelets: 267 10*3/uL (ref 150–400)
RBC: 4.43 MIL/uL (ref 4.22–5.81)
RDW: 12.2 % (ref 11.5–15.5)
WBC: 5.7 10*3/uL (ref 4.0–10.5)
nRBC: 0 % (ref 0.0–0.2)

## 2021-09-14 LAB — ETHANOL: Alcohol, Ethyl (B): <10 mg/dL (ref <10)

## 2021-09-14 LAB — BASIC METABOLIC PANEL
Anion gap: 10 (ref 5–15)
BUN: 9 mg/dL (ref 6–20)
CO2: 29 mmol/L (ref 22–32)
Calcium: 9.4 mg/dL (ref 8.9–10.3)
Chloride: 97 mmol/L — ABNORMAL LOW (ref 98–111)
Creatinine, Ser: 0.89 mg/dL (ref 0.61–1.24)
GFR, Estimated: >60 mL/min (ref >60)
Glucose, Bld: 118 mg/dL — ABNORMAL HIGH (ref 70–99)
Potassium: 3.5 mmol/L (ref 3.5–5.1)
Sodium: 136 mmol/L (ref 135–145)

## 2021-09-14 LAB — RAPID URINE DRUG SCREEN, HOSP PERFORMED
Amphetamines: NOT DETECTED
Barbiturates: NOT DETECTED
Benzodiazepines: POSITIVE — AB
Cocaine: NOT DETECTED
Opiates: NOT DETECTED
Tetrahydrocannabinol: NOT DETECTED

## 2021-09-14 LAB — RESP PANEL BY RT-PCR (FLU A&B, COVID) ARPGX2
Influenza A by PCR: NEGATIVE
Influenza B by PCR: NEGATIVE
SARS Coronavirus 2 by RT PCR: NEGATIVE

## 2021-09-14 MED ORDER — TRAZODONE HCL 100 MG PO TABS
50.0000 mg | ORAL_TABLET | Freq: Every day | ORAL | Status: DC
  Filled 2021-09-14: qty 1

## 2021-09-14 MED ORDER — VENLAFAXINE HCL ER 75 MG PO CP24
75.0000 mg | ORAL_CAPSULE | Freq: Every day | ORAL | Status: DC
  Administered 2021-09-15: 75 mg via ORAL
  Filled 2021-09-14: qty 1

## 2021-09-14 MED ORDER — ALPRAZOLAM 0.5 MG PO TABS: 0.5000 mg | ORAL_TABLET | Freq: Two times a day (BID) | ORAL | Status: DC

## 2021-09-14 MED ORDER — ALPRAZOLAM 0.5 MG PO TABS
1.0000 mg | ORAL_TABLET | Freq: Two times a day (BID) | ORAL | Status: DC
  Administered 2021-09-14 – 2021-09-15 (×2): 1 mg via ORAL
  Filled 2021-09-14 (×2): qty 2

## 2021-09-14 MED ORDER — HYDROXYZINE HCL 25 MG PO TABS
25.0000 mg | ORAL_TABLET | Freq: Three times a day (TID) | ORAL | Status: DC | PRN
  Administered 2021-09-14: 25 mg via ORAL
  Filled 2021-09-14: qty 1

## 2021-09-14 MED ORDER — ALPRAZOLAM 0.25 MG PO TABS: 0.2500 mg | ORAL_TABLET | Freq: Two times a day (BID) | ORAL | Status: DC

## 2021-09-14 NOTE — ED Provider Notes (Signed)
[name]PT Provider Note   CSN: [id number] Arrival date & time: 09/14/21  1112     History  Chief Complaint  Patient presents with   Anxiety    [name] is a 46 y.o. male.  Patient is a 46 year old male who presents with anxiety and depression.  He has a history of anxiety and depression and says he has had worsening symptoms.  He says he is sleeping all the time and feels out of it.  He is worried because he wants to be around for his children although he denies any specific thoughts of self-harm.  He becomes tearful during the conversation and says that he thinks that he needs help.  He denies any alcohol or drug use.  He also says about 2 weeks ago he woke up with weakness in his right arm.  He does currently sleep in his car due to homelessness.  He has some numbness in his right hand.  He denies any numbness of the upper arm.  He denies any leg involvement.       Home Medications Prior to Admission medications   Medication Sig Start Date End Date Taking? Authorizing Provider  ALPRAZolam Prudy Feeler) 1 MG tablet Take 1 mg by mouth every 4 (four) hours.    [provider]  amoxicillin-clavulanate (AUGMENTIN) 875-125 MG per tablet Take 1 tablet by mouth every 12 (twelve) hours. Patient not taking: Reported on 01/16/2015 08/25/14   Patel-Mills, Lorelle Formosa, PA-C  amphetamine-dextroamphetamine (ADDERALL) 20 MG tablet Take 10-20 mg by mouth daily.    [provider]  aspirin 81 MG chewable tablet Chew 1 tablet (81 mg total) by mouth daily. 01/16/15   Horton, Mayer Masker, MD  Melatonin 3 MG TABS Take 1 tablet by mouth at bedtime as needed (sleep).    [provider]  oxyCODONE (OXY IR/ROXICODONE) 5 MG immediate release tablet Take 0.5-1 tablets (2.5-5 mg total) by mouth every 6 (six) hours as needed for severe pain. 01/29/15   Arthor Captain, PA-C  oxymetazoline (AFRIN) 0.05 % nasal spray Place 2 sprays into both nostrils every 4  (four) hours as needed for congestion.     [provider]  propranolol (INDERAL) 20 MG tablet Take 20 mg by mouth daily.    [provider]  vitamin B-12 (CYANOCOBALAMIN) 1000 MCG tablet Take 1,000 mcg by mouth daily.    [provider]      Allergies    Patient has no known allergies.    Review of Systems   Review of Systems  Constitutional:  Negative for chills, diaphoresis, fatigue and fever.  HENT:  Negative for congestion, rhinorrhea and sneezing.   Eyes: Negative.   Respiratory:  Negative for cough, chest tightness and shortness of breath.   Cardiovascular:  Negative for chest pain and leg swelling.  Gastrointestinal:  Negative for abdominal pain, blood in stool, diarrhea, nausea and vomiting.  Genitourinary:  Negative for difficulty urinating, flank pain, frequency and hematuria.  Musculoskeletal:  Negative for arthralgias and back pain.  Skin:  Negative for rash.  Neurological:  Positive for weakness and numbness. Negative for dizziness, speech difficulty and headaches.  Psychiatric/Behavioral:  Positive for dysphoric mood. Negative for suicidal ideas.     Physical Exam Updated Vital Signs BP 102/71   Pulse 61   Temp 98.2 F (36.8 C) (Oral)   Resp 16   Ht 6\' 3"  (1.905 m)   Wt 100 kg   SpO2 98%   BMI 27.56  kg/m  Physical Exam Constitutional:      Appearance: He is well-developed.  HENT:     Head: Normocephalic and atraumatic.  Eyes:     Pupils: Pupils are equal, round, and reactive to light.  Cardiovascular:     Rate and Rhythm: Normal rate and regular rhythm.     Heart sounds: Normal heart sounds.  Pulmonary:     Effort: Pulmonary effort is normal. No respiratory distress.     Breath sounds: Normal breath sounds. No wheezing or rales.  Chest:     Chest wall: No tenderness.  Abdominal:     General: Bowel sounds are normal.     Palpations: Abdomen is soft.     Tenderness: There is no abdominal tenderness. There is no guarding or  rebound.  Musculoskeletal:        General: Normal range of motion.     Cervical back: Normal range of motion and neck supple.     Comments: Patient has right wrist drop.  He has good strength in his upper arm.  He has good flexion against resistance at the right wrist.  However he has weak extension of the wrist.  He has numbness on the radial side of his hand.  He has normal sensation to light touch in his upper arm bilaterally.  He has normal motor function sensation in the remainder of the extremities, radial pulses are intact  Lymphadenopathy:     Cervical: No cervical adenopathy.  Skin:    General: Skin is warm and dry.     Findings: No rash.  Neurological:     Mental Status: He is alert and oriented to person, place, and time.  Psychiatric:        Mood and Affect: Affect is blunt and tearful.     ED Results / Procedures / Treatments   Labs (all labs ordered are listed, but only abnormal results are displayed) Labs Reviewed  BASIC METABOLIC PANEL - Abnormal; Notable for the following components:      Result Value   Chloride 97 (*)    Glucose, Bld 118 (*)    All other components within normal limits  RAPID URINE DRUG SCREEN, HOSP PERFORMED - Abnormal; Notable for the following components:   Benzodiazepines POSITIVE (*)    All other components within normal limits  CBC WITH DIFFERENTIAL/PLATELET  ETHANOL    EKG None  Radiology No results found.  Procedures Procedures    Medications Ordered in ED Medications - No data to display  ED Course/ Medical Decision Making/ A&P                           Medical Decision Making Amount and/or Complexity of Data Reviewed Labs: ordered.   Patient is a 46 year old male who presents with depression and anxiety.  He is very tearful but denies any definitive SI.  He does request speaking to a counselor and needs help for his depression.  His labs are reviewed and are nonconcerning.  He does have evidence of a radial nerve palsy  on exam.  I discussed with him following up with an orthopedist and have given him information of who to contact regarding this.  Patient is medically cleared and pending TTS evaluation.  Final Clinical Impression(s) / ED Diagnoses Final diagnoses:  Depression, unspecified depression type  Radial nerve palsy, right    Rx / DC Orders ED Discharge Orders     None  Rolan Bucco, MD 09/14/21 1425

## 2021-09-14 NOTE — ED Notes (Signed)
Wanded by security 

## 2021-09-14 NOTE — Progress Notes (Signed)
[facility] LCSW Progress Note   09/14/2021    8:11 PM  [name]   [id number]   Type of Contact and Topic: Psychiatric Bed Placement    Patient meets inpatient criteria per Tammi Klippel, NP. Patient referred to the following facilities:  Destination Service Provider Address Phone Fax  [facility]  [address] [place] [address] [phone]  [facility]  [address] [place] [address] [phone]  [facility]  [address] [place] [address] [phone]  [facility]  [address], [place] [address] [phone]  [facility]  [address] [place] [address] [phone]  [facility]  [address] [place] [address] [phone]  [facility]  [address] [place] [address] [phone]    CSW will continue to monitor disposition.   Signed:  Corky Crafts, MSW, [place], LCASA 09/14/2021 8:11 PM
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
## 2021-09-14 NOTE — Discharge Instructions (Addendum)
I have given you a referral above to call make an appointment to follow-up regarding your arm weakness.  For your behavioral health needs you are advised to continue treatment with Milagros Evener, MD:      Milagros Evener, MD      [address]      [place], [place] [address]      [phone]   For an affordable alternative, contact [facility] at your earliest opportunity:      [facility]      [address], SECOND FLOOR      [place], [place] [address]      [phone]      They offer psychiatry/medication management and therapy.  New patients are seen in their walk-in clinic.  Walk-in hours are Monday, Wednesday, Thursday and Friday from 8:00 am - 11:00 am for psychiatry, and Monday and Wednesday from 8:00 am - 11:00 am for therapy.  Walk-in patients are seen on a first come, first served basis, so try to arrive as early as possible for the best chance of being seen the same day.  BE SURE TO TAKE THE ELEVATOR TO THE SECOND FLOOR.  Please note that to be eligible for services you must bring an ID or a piece of mail with your name and a [facility] address.

## 2021-09-14 NOTE — ED Triage Notes (Signed)
Pt reports trouble sleeping for days at a time, anxiety and depression. Pt takes xanax. Denies SI. Pt has been homeless since Feb. Endorses numbness to right hand for 2 weeks. Denies alcohol or drug use.

## 2021-09-15 DIAGNOSIS — F332 Major depressive disorder, recurrent severe without psychotic features: Secondary | ICD-10-CM

## 2021-09-15 NOTE — Consult Note (Signed)
Telepsych Consultation   Reason for Consult:  Psychiatric Reassessment depressive symptoms Referring Physician:  Dr. Rolan Bucco Location of Patient:    [facility] Location of Provider: Other: virtual home office  Patient Identification: Michael [name] MRN:  [id number] Principal Diagnosis: Major depressive disorder, recurrent, severe without psychotic features (HCC) Diagnosis:  Principal Problem:   Major depressive disorder, recurrent, severe without psychotic features (HCC)   Total Time spent with patient: 30 minutes  Subjective:   Michael [name] is a 46 y.o. male patient admitted with depressive symptoms, no suicidal or homicidal ideations, plan or intent.    HPI:   Patient seen via telepsych by this provider; chart reviewed and consulted with Dr. Lucianne Muss on 09/15/21.  On evaluation Michael [name] reports he no longer wants inpatient admission and would like to be discharged.   Pt reports he was feeling overwhelmed on admission but after spending the night in the ED, he's had some time to think.  Pt states he's been living out of his car since Feb 2023 and has had little opportunity to socialize with others.  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  Plans to reach out to his sister regarding relocations to stay with her in [place]. States she is very supportive of him.   Pt denies suicidal ideations, homicidal ideations; denies audible or visual hallucinations.  PT denies prior suicide attempts; reports on prior inpatient stay for panic attack and being scared to leave his house after his mother passed away when he was 20.  States is homeless, states he living out of his car. He states he does not need homeless resources, previously received resources from dept human services so aware  of safe parking lots that he can house his car "to rest for the night and have access to the bathrooms."     Past Psychiatric History:anxiety, depression, panic disorder  Risk to Self:  no Risk to Others:  no Prior Inpatient Therapy:  yes, several decades ago at age 8 for "panic attack" Prior Outpatient Therapy:  yes, currently established with Doreen Salvage.  Past Medical History:  Past Medical History:  Diagnosis Date   Anxiety    TIA (transient ischemic attack)    History reviewed. No pertinent surgical history. Family History: No family history on file. Family Psychiatric  History: unknown Social History:  Social History   Substance and Sexual Activity  Alcohol Use Yes   Comment: rarely     Social History   Substance and Sexual Activity  Drug Use No    Social History   Socioeconomic History   Marital status: Single    Spouse name: Not on file   Number of children: Not on file   Years of education: Not on file   Highest education level: Not on file  Occupational History   Not on file  Tobacco Use   Smoking status: Every Day    Packs/day: 0.50    Years: 20.00    Total pack years: 10.00    Types: Cigarettes   Smokeless tobacco: Not on file  Substance and Sexual Activity   Alcohol use: Yes    Comment: rarely   Drug use: No   Sexual activity: Yes    Birth control/protection: None  Other  Topics Concern   Not on file  Social History Narrative   Not on file   Social Determinants of Health   Financial Resource Strain: Not on file  Food Insecurity: Not on file  Transportation Needs: Not on file  Physical Activity: Not on file  Stress: Not on file  Social Connections: Not on file   Additional Social History:    Allergies:   Allergies  Allergen Reactions   Melatonin Other (See Comments)    Does not help the patient achieve drowsiness and makes the legs "jerk"    Tylenol Pm Extra Strength [Diphenhydramine-Apap (Sleep)] Other (See Comments)    Does  not help the patient achieve drowsiness and makes the legs "jerk"     Labs:  Results for orders placed or performed during the hospital encounter of 09/14/21 (from the past 48 hour(s))  Basic metabolic panel     Status: Abnormal   Collection Time: 09/14/21 12:29 PM  Result Value Ref Range   Sodium 136 135 - 145 mmol/L   Potassium 3.5 3.5 - 5.1 mmol/L   Chloride 97 (L) 98 - 111 mmol/L   CO2 29 22 - 32 mmol/L   Glucose, Bld 118 (H) 70 - 99 mg/dL    Comment: Glucose reference range applies only to samples taken after fasting for at least 8 hours.   BUN 9 6 - 20 mg/dL   Creatinine, Ser 0.89 0.61 - 1.24 mg/dL   Calcium 9.4 8.9 - 10.3 mg/dL   GFR, Estimated >60 >60 mL/min    Comment: (NOTE) Calculated using the CKD-EPI Creatinine Equation (2021)    Anion gap 10 5 - 15    Comment: Performed at [facility], [place] [address], [place], [place] [address]  CBC with Differential     Status: None   Collection Time: 09/14/21 12:29 PM  Result Value Ref Range   WBC 5.7 4.0 - 10.5 K/uL   RBC 4.43 4.22 - 5.81 MIL/uL   Hemoglobin 13.7 13.0 - 17.0 g/dL   HCT 39.7 39.0 - 52.0 %   MCV 89.6 80.0 - 100.0 fL   MCH 30.9 26.0 - 34.0 pg   MCHC 34.5 30.0 - 36.0 g/dL   RDW 12.2 11.5 - 15.5 %   Platelets 267 150 - 400 K/uL   nRBC 0.0 0.0 - 0.2 %   Neutrophils Relative % 63 %   Neutro Abs 3.6 1.7 - 7.7 K/uL   Lymphocytes Relative 28 %   Lymphs Abs 1.6 0.7 - 4.0 K/uL   Monocytes Relative 6 %   Monocytes Absolute 0.4 0.1 - 1.0 K/uL   Eosinophils Relative 2 %   Eosinophils Absolute 0.1 0.0 - 0.5 K/uL   Basophils Relative 1 %   Basophils Absolute 0.0 0.0 - 0.1 K/uL   Immature Granulocytes 0 %   Abs Immature Granulocytes 0.01 0.00 - 0.07 K/uL    Comment: Performed at [facility], [place] [address], [place], [place] [address]  Urine rapid drug screen (hosp performed)     Status: Abnormal   Collection Time: 09/14/21  1:13 PM  Result Value Ref Range   Opiates NONE  DETECTED NONE DETECTED   Cocaine NONE DETECTED NONE DETECTED   Benzodiazepines POSITIVE (A) NONE DETECTED   Amphetamines NONE DETECTED NONE DETECTED   Tetrahydrocannabinol NONE DETECTED NONE DETECTED   Barbiturates NONE DETECTED NONE DETECTED    Comment: (NOTE) DRUG SCREEN FOR MEDICAL PURPOSES ONLY.  IF CONFIRMATION IS NEEDED FOR ANY PURPOSE, NOTIFY  LAB WITHIN 5 DAYS.  LOWEST DETECTABLE LIMITS FOR URINE DRUG SCREEN Drug Class                     Cutoff (ng/mL) Amphetamine and metabolites    1000 Barbiturate and metabolites    200 Benzodiazepine                 [id number] Tricyclics and metabolites     300 Opiates and metabolites        300 Cocaine and metabolites        300 THC                            50 Performed at [facility], [place] [address] [place], [place] [address]   Ethanol     Status: None   Collection Time: 09/14/21  1:45 PM  Result Value Ref Range   Alcohol, Ethyl (B) <10 <10 mg/dL    Comment: (NOTE) Lowest detectable limit for serum alcohol is 10 mg/dL.  For medical purposes only. Performed at [facility], [place] [address] [place], [place] [address]   Resp Panel by RT-PCR (Flu A&B, Covid) Anterior Nasal Swab     Status: None   Collection Time: 09/14/21  3:21 PM   Specimen: Anterior Nasal Swab  Result Value Ref Range   SARS Coronavirus 2 by RT PCR NEGATIVE NEGATIVE    Comment: (NOTE) SARS-CoV-2 target nucleic acids are NOT DETECTED.  The SARS-CoV-2 RNA is generally detectable in upper respiratory specimens during the acute phase of infection. The lowest concentration of SARS-CoV-2 viral copies this assay can detect is 138 copies/mL. A negative result does not preclude SARS-Cov-2 infection and should not be used as the sole basis for treatment or other patient management decisions. A negative result may occur with  improper specimen collection/handling, submission of specimen other than nasopharyngeal swab, presence of  viral mutation(s) within the areas targeted by this assay, and inadequate number of viral copies(<138 copies/mL). A negative result must be combined with clinical observations, patient history, and epidemiological information. The expected result is Negative.  Fact Sheet for Patients:  EntrepreneurPulse.com.au  Fact Sheet for Healthcare Providers:  IncredibleEmployment.be  This test is no t yet approved or cleared by the [place] FDA and  has been authorized for detection and/or diagnosis of SARS-CoV-2 by FDA under an Emergency Use Authorization (EUA). This EUA will remain  in effect (meaning this test can be used) for the duration of the COVID-19 declaration under Section 564(b)(1) of the Act, 21 U.S.C.section 360bbb-3(b)(1), unless the authorization is terminated  or revoked sooner.       Influenza A by PCR NEGATIVE NEGATIVE   Influenza B by PCR NEGATIVE NEGATIVE    Comment: (NOTE) The Xpert Xpress SARS-CoV-2/FLU/RSV plus assay is intended as an aid in the diagnosis of influenza from Nasopharyngeal swab specimens and should not be used as a sole basis for treatment. Nasal washings and aspirates are unacceptable for Xpert Xpress SARS-CoV-2/FLU/RSV testing.  Fact Sheet for Patients: EntrepreneurPulse.com.au  Fact Sheet for Healthcare Providers: IncredibleEmployment.be  This test is not yet approved or cleared by the [place] FDA and has been authorized for detection and/or diagnosis of SARS-CoV-2 by FDA under an Emergency Use Authorization (EUA). This EUA will remain in effect (meaning this test can be used) for the duration of the COVID-19 declaration under Section 564(b)(1) of the Act, 21 U.S.C. section 360bbb-3(b)(1), unless  the authorization is terminated or revoked.  Performed at [facility], [address], [place], [place] [address]     Medications:  Current  Facility-Administered Medications  Medication Dose Route Frequency Provider Last Rate Last Admin   ALPRAZolam Prudy Feeler) tablet 1 mg  1 mg Oral BID Dahlia Byes C, NP   1 mg at 09/15/21 0934   hydrOXYzine (ATARAX) tablet 25 mg  25 mg Oral TID PRN Dahlia Byes C, NP   25 mg at 09/14/21 1832   traZODone (DESYREL) tablet 50 mg  50 mg Oral QHS Dahlia Byes C, NP       venlafaxine XR (EFFEXOR-XR) 24 hr capsule 75 mg  75 mg Oral Q breakfast Onuoha, Josephine C, NP   75 mg at 09/15/21 8101   Current Outpatient Medications  Medication Sig Dispense Refill   ALPRAZolam (XANAX) 1 MG tablet Take 1-2 mg by mouth 3 (three) times daily as needed for anxiety.     desvenlafaxine (PRISTIQ) 100 MG 24 hr tablet Take 100 mg by mouth every morning.     propranolol (INDERAL) 20 MG tablet Take 20 mg by mouth 3 (three) times daily as needed (for migraines).     amoxicillin-clavulanate (AUGMENTIN) 875-125 MG per tablet Take 1 tablet by mouth every 12 (twelve) hours. (Patient not taking: Reported on 09/14/2021) 14 tablet 0   aspirin 81 MG chewable tablet Chew 1 tablet (81 mg total) by mouth daily. (Patient not taking: Reported on 09/14/2021) 30 tablet 0   oxyCODONE (OXY IR/ROXICODONE) 5 MG immediate release tablet Take 0.5-1 tablets (2.5-5 mg total) by mouth every 6 (six) hours as needed for severe pain. (Patient not taking: Reported on 09/14/2021) 20 tablet 0    Musculoskeletal: pt moves all extremities and ambulates independently Strength & Muscle Tone: within normal limits Gait & Station: normal Patient leans: N/A   Psychiatric Specialty Exam:  Presentation  General Appearance: Appropriate for Environment; Casual; Neat (in hospital scrubs, head is bald but facial hair is shaved and neatly groomed)  Eye Contact:Good  Speech:Clear and Coherent; Normal Rate  Speech Volume:Normal  Handedness:Right   Mood and Affect  Mood:Depressed  Affect:Congruent; Full Range (pt smiles when talking about his  daughter "jesse")   Thought Process  Thought Processes:Coherent; Goal Directed  Descriptions of Associations:Intact  Orientation:Full (Time, Place and Person)  Thought Content:Logical (has improved since admissions)  History of Schizophrenia/Schizoaffective disorder:No data recorded Duration of Psychotic Symptoms:No data recorded Hallucinations:Hallucinations: None  Ideas of Reference:None  Suicidal Thoughts:Suicidal Thoughts: No  Homicidal Thoughts:Homicidal Thoughts: No   Sensorium  Memory:Immediate Good; Recent Good; Remote Good  Judgment:Good  Insight:Good   Executive Functions  Concentration:Good  Attention Span:Good  Recall:Good  Fund of Knowledge:Good  Language:Good   Psychomotor Activity  Psychomotor Activity:Psychomotor Activity: Normal   Assets  Assets:Communication Skills; Social Support (sister who lives in [place];)   Sleep  Sleep:Sleep: Fair Number of Hours of Sleep: 6    Physical Exam: Physical Exam Constitutional:      Appearance: Normal appearance.  Cardiovascular:     Rate and Rhythm: Normal rate.     Pulses: Normal pulses.  Pulmonary:     Effort: Pulmonary effort is normal.  Musculoskeletal:        General: Normal range of motion.     Cervical back: Normal range of motion.  Neurological:     General: No focal deficit present.     Mental Status: He is oriented to person, place, and time.  Psychiatric:  Attention and Perception: Attention and perception normal.        Mood and Affect: Mood is depressed.        Speech: Speech normal.        Behavior: Behavior normal. Behavior is cooperative.        Thought Content: Thought content normal. Thought content is not paranoid or delusional. Thought content does not include homicidal ideation. Thought content does not include homicidal or suicidal plan.        Cognition and Memory: Cognition and memory normal.        Judgment: Judgment normal.   Review of Systems   Constitutional: Negative.   HENT: Negative.    Eyes: Negative.   Respiratory: Negative.    Cardiovascular: Negative.   Gastrointestinal: Negative.   Genitourinary: Negative.   Musculoskeletal: Negative.   Skin: Negative.   Neurological: Negative.   Endo/Heme/Allergies: Negative.   Psychiatric/Behavioral:  Positive for depression. Negative for hallucinations and suicidal ideas. The patient is not nervous/anxious and does not have insomnia.    Blood pressure 103/67, pulse (!) 52, temperature 98.6 F (37 C), resp. rate 16, height 6\' 3"  (1.905 m), weight 100 kg, SpO2 97 %. Body mass index is 27.56 kg/m.  Treatment Plan Summary: Patient voluntary presents with depression and anxiety in the setting of mounting psychosocial stressors.  No suicidal or homicidal ideations, no plan or intent for self harm. His judgement and insight are good; he has strong familial protective factors against self harm.  Pt is already connected with outpatient psychiatry for medication management, Dr. Melvyn Neth and has a pending follow-up visit for next month.  Pt has financial concerns that may cause barriers to care, thus asked SW, Jalene Mullet to include resources for [facility] for outpatient psychiatry. Above discussed with patient who agrees with plan of care.   As per above, patient does not meet criteria for involuntary commitment and does not desire inpatient psychiatric care.    He should continue his outpatient psychiatric medications as prescribed.   Disposition: Patient does not meet criteria for psychiatric inpatient admission.  This service was provided via telemedicine using a 2-way, interactive audio and video technology.  Spoke with Dr. Pattricia Boss, ED Provider;Jalene Mullet, LCSW; Laurena Spies, RN, were all informed of above recommendation and disposition via secure chat.   Names of all persons participating in this telemedicine service and their role in this encounter. Name: Michael [name] Role:  Patient  Name: Merlyn Lot Role: PMHNP    Mallie Darting, NP 09/15/2021 11:30 AM

## 2021-09-15 NOTE — BH Assessment (Signed)
[facility] Assessment Progress Note   Per Ophelia Shoulder, NP, this voluntary pt does not require psychiatric hospitalization at this time.  Pt is psychiatrically cleared.  Discharge instructions advise pt to follow up with his current outpatient provider, Milagros Evener, MD, but also offer referral information for [facility] as an alternative.  EDP Margarita Grizzle, MD and pt's nurse, Casimiro Needle, have been notified.  Doylene Canning, MA Triage Specialist [phone]

## 2021-10-21 ENCOUNTER — Encounter (HOSPITAL_COMMUNITY): Payer: Self-pay | Admitting: Pharmacy Technician

## 2021-10-21 ENCOUNTER — Other Ambulatory Visit: Payer: Self-pay

## 2021-10-21 ENCOUNTER — Emergency Department (HOSPITAL_COMMUNITY)
Admission: EM | Admit: 2021-10-21 | Discharge: 2021-10-21 | Disposition: A | Discharge status: Home / Self Care | Payer: 59 | Attending: Emergency Medicine | Admitting: Emergency Medicine

## 2021-10-21 DIAGNOSIS — F419 Anxiety disorder, unspecified: Secondary | ICD-10-CM | POA: Insufficient documentation

## 2021-10-21 DIAGNOSIS — Z7982 Long term (current) use of aspirin: Secondary | ICD-10-CM | POA: Insufficient documentation

## 2021-10-21 MED ORDER — ALPRAZOLAM 0.5 MG PO TABS
3.0000 mg | ORAL_TABLET | Freq: Once | ORAL | Status: DC
  Filled 2021-10-21: qty 6

## 2021-10-21 MED ORDER — ALPRAZOLAM 0.5 MG PO TABS
2.0000 mg | ORAL_TABLET | Freq: Once | ORAL | Status: AC
  Administered 2021-10-21: 2 mg via ORAL
  Filled 2021-10-21: qty 4

## 2021-10-21 NOTE — ED Provider Notes (Addendum)
Michael [name] is a 46 y.o. male.  Pt reports he is out of xanax.  Pt reports he is having some withdrawal symptoms.  Pt reports he can not get his rx tomorrow.  Pt reports he has been taking xanax for 20 years.  Pt reports he has severe anxiety.    The history is provided by the patient. No language interpreter was used.  Anxiety This is a new problem. The current episode started yesterday. The problem occurs constantly. The problem has been gradually worsening. Nothing aggravates the symptoms. Nothing relieves the symptoms. He has tried nothing for the symptoms. The treatment provided no relief.       Home Medications Prior to Admission medications   Medication Sig Start Date End Date Taking? Authorizing Provider  ALPRAZolam Prudy Feeler) 1 MG tablet Take 1-2 mg by mouth 3 (three) times daily as needed for anxiety.    [provider]  amoxicillin-clavulanate (AUGMENTIN) 875-125 MG per tablet Take 1 tablet by mouth every 12 (twelve) hours. Patient not taking: Reported on 09/14/2021 08/25/14   Catha Gosselin, PA-C  aspirin 81 MG chewable tablet Chew 1 tablet (81 mg total) by mouth daily. Patient not taking: Reported on 09/14/2021 01/16/15   Shon Baton, MD  desvenlafaxine (PRISTIQ) 100 MG 24 hr tablet Take 100 mg by mouth every morning. 09/03/21   [provider]  oxyCODONE (OXY IR/ROXICODONE) 5 MG immediate release tablet Take 0.5-1 tablets (2.5-5 mg total) by mouth every 6 (six) hours as needed for severe pain. Patient not taking: Reported on 09/14/2021 01/29/15   Arthor Captain, PA-C  propranolol (INDERAL) 20 MG tablet Take 20 mg by mouth 3 (three) times daily as needed (for migraines).    [provider]      Allergies    Melatonin and Tylenol pm extra strength  [diphenhydramine-apap (sleep)]    Review of Systems   Review of Systems  All other systems reviewed and are negative.   Physical Exam Updated Vital Signs BP (!) 131/93 (BP Location: Left Arm)   Pulse (!) 50   Temp 98 F (36.7 C) (Oral)   Resp 18   SpO2 100%  Physical Exam Vitals and nursing note reviewed.  Constitutional:      General: He is not in acute distress.    Appearance: He is well-developed.  HENT:     Head: Normocephalic and atraumatic.  Eyes:     Conjunctiva/sclera: Conjunctivae normal.  Cardiovascular:     Rate and Rhythm: Normal rate and regular rhythm.     Heart sounds: No murmur heard. Pulmonary:     Effort: Pulmonary effort is normal. No respiratory distress.     Breath sounds: Normal breath sounds.  Abdominal:     Tenderness: There is abdominal tenderness.  Musculoskeletal:        General: No swelling.  Skin:    General: Skin is warm and dry.     Capillary Refill: Capillary refill takes less than 2 seconds.  Neurological:     Mental Status: He is alert.  Psychiatric:        Mood and Affect: Mood normal.     ED Results / Procedures / Treatments   Labs (all labs ordered are listed, but only abnormal results are displayed) Labs Reviewed - No data  to display  EKG None  Radiology No results found.  Procedures Procedures    Medications Ordered in ED Medications  ALPRAZolam (XANAX) tablet 3 mg (has no administration in time range)    ED Course/ Medical Decision Making/ A&P                           Medical Decision Making Pt out of xananx.  Pt having some withdrawal symptoms.  Pt unable to fill rx until tomorrow   Amount and/or Complexity of Data Reviewed External Data Reviewed: notes.    Details: Medications reviewed   Risk Prescription drug management. Risk Details: Pt advised to call his MD.  Dr. Evelene Croon regarding medication.  I will give him a dose here.  Pt is stable, vitals normal            Final Clinical  Impression(s) / ED Diagnoses Final diagnoses:  Anxiety    Rx / DC Orders ED Discharge Orders     None     An After Visit Summary was printed and given to the patient.     Elson Areas, PA-C 10/21/21 0837    Elson Areas, PA-C 10/21/21 0840    Gerhard Munch, MD 10/21/21 [phone]

## 2021-10-21 NOTE — ED Triage Notes (Signed)
Pt here POV with reports of running out of his Xanax yesterday. PCP supposed to refill tomorrow, however pt feeling anxious.

## 2021-11-20 ENCOUNTER — Emergency Department (HOSPITAL_COMMUNITY): Payer: Self-pay

## 2021-11-20 ENCOUNTER — Encounter (HOSPITAL_COMMUNITY): Payer: Self-pay

## 2021-11-20 ENCOUNTER — Other Ambulatory Visit: Payer: Self-pay

## 2021-11-20 ENCOUNTER — Emergency Department (HOSPITAL_COMMUNITY)
Admission: EM | Admit: 2021-11-20 | Discharge: 2021-11-20 | Disposition: A | Discharge status: Home / Self Care | Payer: Self-pay | Attending: Emergency Medicine | Admitting: Emergency Medicine

## 2021-11-20 DIAGNOSIS — R7309 Other abnormal glucose: Secondary | ICD-10-CM | POA: Diagnosis not present

## 2021-11-20 DIAGNOSIS — R413 Other amnesia: Secondary | ICD-10-CM

## 2021-11-20 DIAGNOSIS — G454 Transient global amnesia: Secondary | ICD-10-CM | POA: Insufficient documentation

## 2021-11-20 DIAGNOSIS — F1310 Sedative, hypnotic or anxiolytic abuse, uncomplicated: Secondary | ICD-10-CM | POA: Insufficient documentation

## 2021-11-20 DIAGNOSIS — Z5900 Homelessness unspecified: Secondary | ICD-10-CM | POA: Insufficient documentation

## 2021-11-20 DIAGNOSIS — R410 Disorientation, unspecified: Secondary | ICD-10-CM

## 2021-11-20 DIAGNOSIS — Z7982 Long term (current) use of aspirin: Secondary | ICD-10-CM | POA: Insufficient documentation

## 2021-11-20 DIAGNOSIS — F1510 Other stimulant abuse, uncomplicated: Secondary | ICD-10-CM | POA: Insufficient documentation

## 2021-11-20 DIAGNOSIS — Y900 Blood alcohol level of less than 20 mg/100 ml: Secondary | ICD-10-CM | POA: Insufficient documentation

## 2021-11-20 HISTORY — DX: Transient global amnesia: G45.4

## 2021-11-20 LAB — COMPREHENSIVE METABOLIC PANEL
ALT: 15 U/L (ref 0–44)
AST: 16 U/L (ref 15–41)
Albumin: 4.1 g/dL (ref 3.5–5.0)
Alkaline Phosphatase: 64 U/L (ref 38–126)
Anion gap: 8 (ref 5–15)
BUN: 8 mg/dL (ref 6–20)
CO2: 31 mmol/L (ref 22–32)
Calcium: 9.4 mg/dL (ref 8.9–10.3)
Chloride: 103 mmol/L (ref 98–111)
Creatinine, Ser: 0.8 mg/dL (ref 0.61–1.24)
GFR, Estimated: >60 mL/min (ref >60)
Glucose, Bld: 76 mg/dL (ref 70–99)
Potassium: 4.2 mmol/L (ref 3.5–5.1)
Sodium: 142 mmol/L (ref 135–145)
Total Bilirubin: 1 mg/dL (ref 0.3–1.2)
Total Protein: 7.8 g/dL (ref 6.5–8.1)

## 2021-11-20 LAB — CBC
HCT: 45.2 % (ref 39.0–52.0)
Hemoglobin: 15.2 g/dL (ref 13.0–17.0)
MCH: 31.4 pg (ref 26.0–34.0)
MCHC: 33.6 g/dL (ref 30.0–36.0)
MCV: 93.4 fL (ref 80.0–100.0)
Platelets: 350 10*3/uL (ref 150–400)
RBC: 4.84 MIL/uL (ref 4.22–5.81)
RDW: 13 % (ref 11.5–15.5)
WBC: 4.8 10*3/uL (ref 4.0–10.5)
nRBC: 0 % (ref 0.0–0.2)

## 2021-11-20 LAB — CBG MONITORING, ED: Glucose-Capillary: 103 mg/dL — ABNORMAL HIGH (ref 70–99)

## 2021-11-20 LAB — RAPID URINE DRUG SCREEN, HOSP PERFORMED
Amphetamines: POSITIVE — AB
Barbiturates: NOT DETECTED
Benzodiazepines: POSITIVE — AB
Cocaine: NOT DETECTED
Opiates: NOT DETECTED
Tetrahydrocannabinol: NOT DETECTED

## 2021-11-20 LAB — ETHANOL: Alcohol, Ethyl (B): <10 mg/dL (ref <10)

## 2021-11-20 NOTE — ED Provider Notes (Signed)
[facility]-EMERGENCY DEPT Provider Note   CSN: [id number] Arrival date & time: 11/20/21  1324     History  No chief complaint on file.   Michael [name] is a 46 y.o. male with a past medical history significant for depression, anxiety, ADHD, history of TIA, history of transient global amnesia who presents to the ED due to confusion.  Patient states he has had 3 episodes of confusion over the past 24 hours.  He states he goes to bed and wakes up and has no idea where he is.  Patient states episodes of confusion typically last roughly 30 minutes.  He has a history of transient global amnesia.  Denies associated visual changes, speech changes, unilateral weakness.  Patient denies headaches.  Notes he feels "foggy".  Patient concerned his Xanax is causing his symptoms.  He notes he has not missed any doses.  Taking Xanax daily.  Denies drug and alcohol use.  Patient is currently homeless.  No fever or chills.  Denies neck stiffness.  Denies SI, HI, and auditory/visual hallucinations.  Patient also concerned that he can sleep 2 to 3 days in a row without waking up.  History obtained from patient and past medical records. No interpreter used during encounter.       Home Medications Prior to Admission medications   Medication Sig Start Date End Date Taking? Authorizing Provider  ALPRAZolam Prudy Feeler) 1 MG tablet Take 1-2 mg by mouth 3 (three) times daily as needed for anxiety.    [provider]  amoxicillin-clavulanate (AUGMENTIN) 875-125 MG per tablet Take 1 tablet by mouth every 12 (twelve) hours. Patient not taking: Reported on 09/14/2021 08/25/14   Catha Gosselin, PA-C  aspirin 81 MG chewable tablet Chew 1 tablet (81 mg total) by mouth daily. Patient not taking: Reported on 09/14/2021 01/16/15   Shon Baton, MD  desvenlafaxine (PRISTIQ) 100 MG 24 hr tablet Take 100 mg by mouth every morning. 09/03/21   [provider]  oxyCODONE (OXY IR/ROXICODONE)  5 MG immediate release tablet Take 0.5-1 tablets (2.5-5 mg total) by mouth every 6 (six) hours as needed for severe pain. Patient not taking: Reported on 09/14/2021 01/29/15   Arthor Captain, PA-C  propranolol (INDERAL) 20 MG tablet Take 20 mg by mouth 3 (three) times daily as needed (for migraines).    [provider]      Allergies    Melatonin and Tylenol pm extra strength [diphenhydramine-apap (sleep)]    Review of Systems   Review of Systems  Constitutional:  Negative for chills and fever.  Respiratory:  Negative for shortness of breath.   Cardiovascular:  Negative for chest pain.  Gastrointestinal:  Negative for abdominal pain.  Musculoskeletal:  Positive for neck stiffness.  Psychiatric/Behavioral:  Positive for confusion.     Physical Exam Updated Vital Signs BP 101/75   Pulse 76   Temp 98 F (36.7 C)   Resp 18   Ht 6\' 4"  (1.93 m)   Wt 75.3 kg   SpO2 99%   BMI 20.21 kg/m  Physical Exam Vitals and nursing note reviewed.  Constitutional:      General: He is not in acute distress.    Appearance: He is not ill-appearing.  HENT:     Head: Normocephalic.  Eyes:     Pupils: Pupils are equal, round, and reactive to light.  Cardiovascular:     Rate and Rhythm: Normal rate and regular rhythm.     Pulses: Normal pulses.  Heart sounds: Normal heart sounds. No murmur heard.    No friction rub. No gallop.  Pulmonary:     Effort: Pulmonary effort is normal.     Breath sounds: Normal breath sounds.  Abdominal:     General: Abdomen is flat. There is no distension.     Palpations: Abdomen is soft.     Tenderness: There is no abdominal tenderness. There is no guarding or rebound.  Musculoskeletal:        General: Normal range of motion.     Cervical back: Neck supple.  Skin:    General: Skin is warm and dry.  Neurological:     General: No focal deficit present.     Mental Status: He is alert.     Comments: Speech is clear, able to follow commands CN III-XII  intact Normal strength in upper and lower extremities bilaterally including dorsiflexion and plantar flexion, strong and equal grip strength Sensation grossly intact throughout Moves extremities without ataxia, coordination intact No pronator drift Ambulates without difficulty  Psychiatric:        Mood and Affect: Mood normal.        Behavior: Behavior normal.     ED Results / Procedures / Treatments   Labs (all labs ordered are listed, but only abnormal results are displayed) Labs Reviewed  CBG MONITORING, ED - Abnormal; Notable for the following components:      Result Value   Glucose-Capillary 103 (*)    All other components within normal limits  COMPREHENSIVE METABOLIC PANEL  CBC  ETHANOL  RAPID URINE DRUG SCREEN, HOSP PERFORMED    EKG None  Radiology CT Head Wo Contrast  Result Date: 11/20/2021 CLINICAL DATA:  Mental status change. EXAM: CT HEAD WITHOUT CONTRAST TECHNIQUE: Contiguous axial images were obtained from the base of the skull through the vertex without intravenous contrast. RADIATION DOSE REDUCTION: This exam was performed according to the departmental dose-optimization program which includes automated exposure control, adjustment of the mA and/or kV according to patient size and/or use of iterative reconstruction technique. COMPARISON:  CT head 01/16/15 FINDINGS: Brain: No evidence of acute infarction, hemorrhage, hydrocephalus, extra-axial collection or mass lesion/mass effect. Vascular: No hyperdense vessel or unexpected calcification. Skull: Normal. Negative for fracture or focal lesion. Sinuses/Orbits: No acute finding. Other: None. IMPRESSION: No acute intracranial pathology. Electronically Signed   By: Marin Roberts M.D.   On: 11/20/2021 14:35    Procedures Procedures    Medications Ordered in ED Medications - No data to display  ED Course/ Medical Decision Making/ A&P Clinical Course as of 11/20/21 1737  Fri Nov 20, 2021  1544 Glucose-Capillary(!):  103 [CA]  1545 Alcohol, Ethyl (B): <10 [CA]    Clinical Course User Index [CA] Suzy Bouchard, PA-C                           Medical Decision Making Amount and/or Complexity of Data Reviewed Labs: ordered. Decision-making details documented in ED Course. Radiology: ordered and independent interpretation performed. Decision-making details documented in ED Course.  This patient presents to the ED for concern of confusion, this involves an extensive number of treatment options, and is a complaint that carries with it a high risk of complications and morbidity.  The differential diagnosis includes medication side effect, alcohol intoxication, CVA, etc  46 year old male presents to the ED due to episodes of confusion.  Patient has a history of TIA and transient global amnesia.  Patient is  currently homeless and lives in his car.  Patient states he goes to sleep and wakes up confused and unable to tell where he is or what he is doing.  No history of seizures.  Denies drug and alcohol use.  He is currently on Pristiq, Xanax, and Adderall.  Upon arrival, stable vitals.  Patient in no acute distress.  Benign physical exam.  Normal neurological exam without any deficits.  Routine labs ordered.  CT head to rule out any emergent intracranial etiologies. Possible seizures vs. Medication side effects vs. TGA episodes. Low suspicion for CVA given no neurological deficits on exam.   CBC unremarkable.  No leukocytosis and normal hemoglobin.  CMP unremarkable.  Normal renal function.  No major electrolyte derangements.  Ethanol within normal limits.  CT head personally reviewed and interpreted which is negative for any acute abnormalities.  Agree with radiology interpretation.  4:17 PM reassessed patient with Dr. Rosalia Hammers.  Patient continues to be alert and oriented x4.  No neurological deficits.  Low suspicion for CVA.   Given patient has remained alert and oriented during his entire ED stay with no deficits and  reassuring work-up, feel patient is stable for discharge with neurology follow-up. No evidence of seizures during his 4 hour ED stay. Strict ED precautions discussed with patient. Patient states understanding and agrees to plan. Patient discharged home in no acute distress and stable vitals  Discussed with Dr. Rosalia Hammers who evaluated patient at bedside and agrees with assessment and plan.        Final Clinical Impression(s) / ED Diagnoses Final diagnoses:  Amnesia  Confusion    Rx / DC Orders ED Discharge Orders          Ordered    Ambulatory referral to Neurology       Comments: An appointment is requested in approximately: 1 week   11/20/21 1721              Mannie Stabile, PA-C 11/20/21 1737    Margarita Grizzle, MD 11/21/21 [phone]

## 2021-11-20 NOTE — Discharge Instructions (Signed)
It was a pleasure taking care of you today.  As discussed, all of your labs were reassuring.  Your CT head was normal.  Please follow-up with your psychiatrist to discuss possible side effects of your medications.  I have placed a referral to neurology.  Expect a phone call within 1 week to schedule an appointment.  Return to the ER for new or worsening symptoms.

## 2021-11-20 NOTE — ED Provider Triage Note (Signed)
Emergency Medicine Provider Triage Evaluation Note  TORRIN CRIHFIELD , a 46 y.o. male  was evaluated in triage.  Pt complains of confusion for the past few months. Notes he wakes up confused. History of TIA and transient global amnesia. Admits to blurry vision. No speech changes or unilateral weakness. He is concerned his Xanax is causing his symptoms. He takes it everyday, no missed doses. Denies drug and alcohol use.   Review of Systems  Positive: confusion Negative: fever  Physical Exam  BP 109/88 (BP Location: Right Arm)   Pulse 68   Resp 18   Ht 6\' 4"  (1.93 m)   Wt 75.3 kg   BMI 20.21 kg/m  Gen:   Awake, no distress   Resp:  Normal effort  MSK:   Moves extremities without difficulty  Other:  No facial droop, no pronator drift  Medical Decision Making  Medically screening exam initiated at 1:34 PM.  Appropriate orders placed.  Minette Brine Vosler was informed that the remainder of the evaluation will be completed by another provider, this initial triage assessment does not replace that evaluation, and the importance of remaining in the ED until their evaluation is complete.  Labs CT head   Karie Kirks 11/20/21 1335

## 2021-11-20 NOTE — ED Triage Notes (Signed)
Patient reports that he goes to sleep and when she wakes up he feels confused.x months. Patient reports a history of TIA and in his record it has Transient Global Amnesia.  Patient denies headaches, but states he "feels foggy headed."

## 2021-12-16 ENCOUNTER — Encounter (HOSPITAL_COMMUNITY): Payer: Self-pay

## 2021-12-16 ENCOUNTER — Emergency Department (HOSPITAL_COMMUNITY)
Admission: EM | Admit: 2021-12-16 | Discharge: 2021-12-16 | Disposition: A | Discharge status: Home / Self Care | Payer: Commercial Managed Care - HMO | Attending: Emergency Medicine | Admitting: Emergency Medicine

## 2021-12-16 ENCOUNTER — Ambulatory Visit (HOSPITAL_COMMUNITY)
Admission: EM | Admit: 2021-12-16 | Discharge: 2021-12-16 | Disposition: A | Discharge status: Home / Self Care | Payer: Commercial Managed Care - HMO

## 2021-12-16 DIAGNOSIS — F419 Anxiety disorder, unspecified: Secondary | ICD-10-CM | POA: Diagnosis not present

## 2021-12-16 DIAGNOSIS — F1990 Other psychoactive substance use, unspecified, uncomplicated: Secondary | ICD-10-CM

## 2021-12-16 NOTE — Discharge Instructions (Addendum)
Good afternoon, Michael [name]!   As discussed, a referral to the partial hospitalization program (PHP) with [place] has been submitted to [facility], [facility], [place], [place], and [employer], [place].  You are scheduled for an assessment for the PHP on 12/22/21 @ 10a. This appointment will last approximately one hour and will be virtual via Webex. PHP is virtual group therapy that runs Mon-Fri from 9am-1pm. Please download the [employer] app prior to the appointment. If you need to cancel or reschedule, please call [phone].    In the meantime, be sure to maintain the appointments you have scheduled for neurology and with [facility].  Below is a list of other resources for outpatient therapy and medication management.  [facility] is the second item on the list.   It is imperative that you follow through with treatment recommendations within 5-7 days from the day of discharge to mitigate further risk to your safety and overall mental well-being.  A list of outpatient therapy and psychiatric providers for medication management has been provided below to get you started in finding the right provider for you.            [facility] [address] [place], [place], [address] [phone] phone (Medicare, Private insurance except Tricare, [place], and [facility])  [facility] [address] [place], [place], [address] [address] phone ([address], AmeriHealth [facility], [address], [place], [place], Friday Health Plans, [address], BCBS Healthy [place], [place], [address], [place], [place], [place], [place], Tricare, [facility], [employer], [employer] and Company)  [employer] [phone] [address], Suite [place], [place], [address] [phone] phone [phone] phone [phone] fax  [facility] [address] [place], [place],  [address] [phone] phone (Call to confirm insurance coverage) Consultation & Support Services     o Drop-In Hours: 1:00 PM to 5:00 PM     o Days: Monday - Thursday  Crisis Services (24/7)   [facility] [address] [place], [place], [address] [phone] phone ([address] Lowell Paxton, [place], [place] Medicaid, [place] and [place], [facility])      [facility] [address] [place], [place], [address] [phone] phone [email or web address]  (to complete the intake form and upload ID and insurance cards)  [facility] [address] [place], [place], [address] [phone] phone ([address], [address], [place], [address] [employer], [place], [place], [place], [facility], [place], and certain Medicaid plans)  [facility] [phone] [address] [place], [place], [address] [phone] phone [phone] fax (Medicaid, Medicare, Self-pay, call about other insurance coverage)  [facility] (age 28+) [address] [place], [place], [address] [phone] hone [phone] fax ([place], [address], [place], [place], [place], [address], [place], [place], [place], [place], certain [employer], [facility], UMR)  [employer] [address] [place], [place], [address] [phone] phone (Medicare, Medicaid, Artemio Aly, call about other insurance coverage)  [facility] [address] [place], [place], [address] [phone] phone [phone] fax (Call [phone] to see what insurance is accepted) Archer Asa, MD specializes in geropsych)  [facility], [facility]  (medication management only) [address] [place], [place], [address] [phone] phone [phone] fax ([address] Medicaid, [place], [place], [place], [place], [place], [place], [place])  [facility] (medication  management only) [address] [place], [place], [address] [phone]  phone [phone] fax ([address], Medicare, [place], [place], Tricare [place])  [facility] [address] [place], [place], [address] [phone] phone [phone] fax ([address] [place], [place], [place], [place], [facility], [facility] Medicaid/[place] Health Choice)  [facility] [place], [employer]. [address] [place], [place], [address] [phone] phone [phone] fax (Medicare, Medicaid, [facility])  [facility] [address] [place], [place] [address] [phone] phone ([address], [place], [place], [place], [place], Medicare, [place], [facility]) Does genetic testing for medications; does transcranial magnetic stimulation along with basic services)  [facility] [address] [place], [place], [address] [phone] phone (Call about insurance coverage)  [facility] [address] [place], [place], [address] [phone] phone [phone] fax (Call about insurance coverage)  [facility] [address] [place], [place], [address] [phone] phone [phone] fax (Call about insurance coverage)  [employer] [phone] [address] [place], [address] [phone] phone (Medicaid, Tricare, [place], [place], [place])  [employer] [address] [place], [place], [address] [phone] phone (Medicaid, Medicare, call about other insurance coverage)  [facility] [address] [place], [place], [address] [phone] phone [phone] fax (Medicaid, Medicare, Tricare, call about other insurance coverage)  [facility] [address] [place], [address] [phone] phone ([address], [place], [place], [place], [place] types - Alliance, Secretary/administrator, Partners, [place], [place] Health Choice, Healthy [place], [place], [place], and Complete)  [facility] [address] [place], [place], [address] [phone] phone Completely online treatment platform Contact: Personal assistant - [facility] Specialist [phone] phone [phone] fax ([address] [place], [place], Friday Health Plan, [place], [place], [place], [place], [place], [place] Optum)    Shelters   [facility] [address] [place], [place] [address] [phone] Population served: Adult men & women (59 years old and older, able to perform activities for daily living) Documents required: Valid ID & Social Security Card  [facility] [address] [place], [place]  [address] [phone] Population served: Families with children  [facility] [address] [place], [place]  [address] [phone] Population served: Single women 18+ without dependents Documents required:  Valid ID & Social Security Card  [facility] [address], [place], [place]  [address] [phone] Population served: Male veterans 18+ with substance abuse/mental health issues Eligibility: By referral only  [facility] [address], [place], [place] [address] [phone] Population served: Males 18+ Documents required: Valid ID & Social Security Card  [facility] [employer] [facility], [employer]. [address] [place] [address] [phone] or [phone] Population served: Pregnant women with or without children  Documents required: Valid ID & Social Security Ship broker of [employer] [address], [place], [place] [address] [phone] Population Served: Families with children, adult women, and adult men.  [facility] [address] [place], [place] [address] [phone] Population served: Men 18+, preference for disabled and/or veterans Eligibility: By referral only  [facility] [place] [facility]) -  [facility] [address], [place], [place] [address] [phone] or [phone] Population served: Families with children.

## 2021-12-16 NOTE — ED Triage Notes (Signed)
Pt presents with c/o anxiety. Pt reports he has been taking Xanax for his anxiety for approx 25 years and he feels like it is ruining his life. Pt reports that he has been homeless since March of this year and he has just been working to make sure he has his medicine. Pt reports he has recently become so dependent on them recently that he is taking more than usual. Pt denies any thoughts of suicide.

## 2021-12-16 NOTE — Progress Notes (Signed)
LCSW Progress Note   Per Kelle Darting, NP, this pt does not require psychiatric hospitalization at this time.  Pt is psychiatrically cleared. LCSW submitted a referral for PHP that was answered by Milana Na, [facility]. Discharge instructions include appointment time for PHP assessment at [facility] and several resources for outpatient therapy and medication management.  EDP Kelle Darting, NP, has been notified.  Hansel Starling, MSW, LCSW [facility] [phone] or [phone]

## 2021-12-16 NOTE — ED Provider Notes (Signed)
Behavioral Health Urgent Care Medical Screening Exam  Patient Name: Michael [name] MRN: [id number] Date of Evaluation: 12/16/21 Chief Complaint: "want to get my life together" Diagnosis:  Final diagnoses:  Anxiety disorder, unspecified type   History of Present illness: Michael [name] is a 46 y.o. male. Pt presents voluntarily to [facility] behavioral health for walk-in assessment.   Pt is assessed face to face by me and LCSW Jake Samples.   [name], 46 y.o., male patient seen face to face by this provider,  and chart reviewed on 12/16/21.  On evaluation Michael [name] reports he is presenting today due to "want to get my life together". Pt reports he is receiving medication management from Dr. Milagros Evener. He is prescribed xanax 2mg  TID, adderall 20mg  TID, and pristiq 100mg  QD. Pt reports he is compliant with medications. He last took his medications this morning. Pt is interested in tapering off of xanax and possibly adderall. He reports he has been taking xanax for 25 years. When asked whether pt has spoken with Dr. about his interest in tapering off of xanax and possibly adderall, he states he feels that Dr. "tries to get my in and out", and that he has not spoken to her about this. Pt is also interested in transfer of services to another provider. Pt notes he has not attempted to taper medication on his own. He notes that at times he will experience periods of disorientation which is relieved when he takes xanax. Pt reports for the past 2 weeks he has been taking xanax 8mg /day.  Pt denies SI/VI/HI, AVH, paranoia.  Pt reports vaping nicotine. He states 1 nicotine cartridge will last him 2 or 3 days. He denies use of marijuana, alcohol, crack/cocaine, other substances.  Pt denies hx of SA or NSSIB. He reports 1 inpatient psychiatric hospitalization at 46 y/o related to anxiety.   Pt reports he is not currently connected with counseling. He is scheduled for a  counseling session with Dr. Evelene Croon on 01/01/22. He is connected with medication management with Dr. .  Pt reports he is currently homeless. He is living in his car. He states he is unemployed, although will work 12.   Pt denies access to a firearm or other weapon.  Pt reports highest level of education is the 9th grade.   Pt reports family psychiatric history is positive, mother was "an alcoholic, severely depressed, died when I was 46 years old".  Discussed w/ pt speaking with Dr. 01/03/22 about his interest in tapering off medication. If pt is interested in transferring providers, that is also a possibility. Discussed referral to PHP/IOP if pt is interested. Pt reports he is interested in PHP/IOP program, provides best contact information for him as [phone], [email or web address]. Additional resources discussed, including IRC, shelters.   Flowsheet Row ED from 12/16/2021 in [facility] from 11/20/2021 in [facility] from 10/21/2021 [facility]  C-SSRS RISK CATEGORY No Risk No Risk No Risk       Psychiatric Specialty Exam  Presentation  General Appearance:Appropriate for Environment; Casual; Fairly Groomed  Eye Contact:Good  Speech:Clear and Coherent; Normal Rate  Speech Volume:Normal  Handedness:Right   Mood and Affect  Mood: Anxious; Depressed  Affect: Flat   Thought Process  Thought Processes: Coherent; Goal Directed; Linear  Descriptions of Associations:Intact  Orientation:Full (Time, Place and Person)  Thought Content:Logical    Hallucinations:None  Ideas of  Reference:None  Suicidal Thoughts:No  Homicidal Thoughts:No   Sensorium  Memory: Immediate Good; Recent Good; Remote Good  Judgment: Good  Insight: Good   Executive Functions  Concentration: Good  Attention Span: Good  Recall: Good  Fund of  Knowledge: Good  Language: Good   Psychomotor Activity  Psychomotor Activity: Normal   Assets  Assets: Communication Skills; Desire for Improvement; Financial Resources/Insurance; Transportation   Sleep  Sleep: Poor  Number of hours:  6   No data recorded  Physical Exam: Physical Exam Constitutional:      General: He is not in acute distress.    Appearance: He is not ill-appearing, toxic-appearing or diaphoretic.  Eyes:     General: No scleral icterus. Cardiovascular:     Rate and Rhythm: Normal rate.  Pulmonary:     Effort: Pulmonary effort is normal. No respiratory distress.  Neurological:     Mental Status: He is alert and oriented to person, place, and time.  Psychiatric:        Attention and Perception: Attention normal.        Mood and Affect: Mood is anxious.    Review of Systems  Constitutional:  Negative for chills and fever.  Respiratory:  Negative for shortness of breath.   Cardiovascular:  Negative for chest pain and palpitations.  Gastrointestinal:  Negative for abdominal pain.  Neurological:  Negative for headaches.  Psychiatric/Behavioral:  Positive for depression. The patient is nervous/anxious.    Blood pressure 127/81, pulse 95, temperature 98.1 F (36.7 C), temperature source Oral, resp. rate 16, height 6\' 2"  (1.88 m), weight 152 lb (68.9 kg), SpO2 100 %. Body mass index is 19.52 kg/m.  Musculoskeletal: Strength & Muscle Tone: within normal limits Gait & Station: normal Patient leans: N/A  [place] [facility] Discharge Disposition for Follow up and Recommendations: Based on my evaluation the patient does not appear to have an emergency medical condition and can be discharged with resources and follow up care in outpatient services for Medication Management and Individual Therapy  Tharon Aquas, NP 12/16/2021, 12:47 PM

## 2021-12-16 NOTE — Progress Notes (Signed)
Patient presents to the [facility] after being discharged from [facility] ED seeking detox from Xanax. Patient states that he is a patient of Dr. Milagros Evener and states that he is prescribed Xanax 2 mg po tid and states that he has been taking Xanax for 25 years for his anxiety and panic disorder. He states that the medication in no longer wotking for him and he states that he would like to come off of it, but states that he has experienced TIAs and what he thought to be seizures when he tried to come off the medication on his own. Patient denies current SI/HI/Psychosis. Patient is currently homeless and living out of his car. He states that he is tired of working and using his money to get his prescription and his medication. Patient states he is not sleeping or eating well and states that he has lost "a lot" of weight. Patient is urgent, but most like will be too high a risk for a [facility] detox admission and will most likely need a medical detox.

## 2021-12-16 NOTE — Discharge Instructions (Signed)
Substance Abuse Treatment Programs ° °Intensive Outpatient Programs °[facility]     °[address]      °[place], [place]                   °[phone]      ° °[facility] °[address] °[place], [place] °[phone] ° °[facility]     °(Inpatient and outpatient)     °[address]           °[phone]   ° °[facility] °[phone] (Suboxone and Methadone) ° °[address]      °[place], [place] [address]      °[phone]      ° °[address] °[place], [place] °852-3033 ° °[facility] (Outpatient/Inpatient, Chemical)    °(insurance only) [phone]      °       °[facility] (Groups & Residential) °[place], [place] °[phone] ° °   °[facility]     °[address]     °[place], [place]      °[phone]      ° °[facility] (for caregivers and family) °[address] °[place], [place] °336-299-4655 ° ° ° ° ° °Residential Treatment Programs °[facility]      °[address], [place], [place] [address]  °([phone]      ° °[facility] °[address] [place], [place] [address] °[phone] ° °[facility]        °336-248-8914      ° °[facility] °1-800-659-3381 ° °[facility]             °[address]                                         °[place], [place]                                                °877-615-2722 or 336-784-9470                              ° °[facility] [place] °[address] °[place], [address] °[phone] ° °[facility]    °[address]      °[place]     °[phone]      ° °[facility] °[address] °, [place] °336-285-9073 ° °[facility]   °[address]     °[place], [place] [address]     °[phone]      °Admissions: 8am-3pm M-F ° °[facility] °[address] °[place],  [place] °336-227-7417 ° °[facility]: Residential Program (90 Days)   °[place], [place]      °336-725-8389 or 800-758-6077    ° °[facility] °[place], [place] °(Walk in Hours over the weekend or by referral) ° °[facility] °[address], [place],  [address] °([phone] ° °Crisis Mobile: [facility]:  1-877-626-1772 (for crisis response 24 hours a day) °[facility] Hotline:      1-800-256-2452 °Outpatient Psychiatry and Counseling ° °[facility]: Mobile Crisis   Management 24 hours:  1-877-626-1772 ° °[facility] sliding scale fee and walk in schedule: M-F 8am-12pm/1pm-3pm °[address]  °[place], [place] [address] °[phone] ° °[facility] °[address] °[place], [place] [address] °336-703-9650 ° °[facility] ([facility])- new patient walk-in appointments available Monday - Friday 8am -3pm.          °[address] °[place], [place] [address] °336-676-6840 or crisis line- 336-676-6905 ° °[facility]/ Intensive Outpatient Therapy Program °[address] °[place], [place] [address] °336-832-9804 ° °[facility]                  °Crisis Services      °[phone]      °[address]     °[place], [place] [address]                ° °[facility]   °[facility] °[phone] °[address] °[place], [place] [address] ° ° °[facility]          °[address]  °[place], [place] [address]       °([phone] ° °[facility] °[address] °[place], [place] [address] °336-292-1510 ° °[facility]    °[address]    °[place], [place] [address]     °[phone]      ° °Parish McKinney, MD     °[address]     °[place] [address]     °336-282-1251     °  °[facility] °[address] °[place] [address] ° °[facility]     °[address]     °[place], [place]      °336-542-2076      ° °[facility] °Shamsher Ahluwalia, MD °[address] °[place], [place] [address] °336-420-9558 ° °[facility]     °[address]     °[place], [place] [address]     °336-274-1237      ° °[facility] °[address] °[place], [place] [address] °336-854-4450 °Resources for Temporary Residential Assistance/Crisis Centers ° °DAY CENTERS °[facility] °M-F 8am-3pm   °[address] [place], [place] [address]   336-332-0824 °Services include: laundry, barbering, support groups, case management, phone  & computer access, showers, AA/NA mtgs, mental health/substance abuse nurse, job skills class, disability information, VA assistance, spiritual classes, etc.  ° °HOMELESS SHELTERS ° °[facility]     °[facility]   °[address], [place]     °[phone]       °       °[facility] (women and children)       °[address] °[place], [place] [address] °336-275-0820 °[email or web address] for application and process °Application Required ° °[facility]   °[address]    °[place] [address]     °[phone]       °             °[facility] °[address] °[place], [place] [address] °336.273.5572 °336-235-0363(schedule application appt.) °Application Required ° °[facility] (women only)    °[address]     °[place],  [address]     °336-884-1039      °  Intake starts 6pm daily °Need valid ID, SSC, & Police report °[facility] [place] °[address] °[place], [place] °336-881-5420 °Application Required ° °[facility] (men only)     °[address]      °[place], [place]     °[phone]      ° °[facility] °(Pregnant women only) °[address] °[place], [place] °336-275-0206 ° °[facility]      °[address]      °[place], [place] [address]     °[phone]      °       °[facility] °[address] °[place], [place] °336-723-1848 °90 day commitment/SA/Application process ° °[facility](men only)     °[address]     °[place], [place]     °[phone]       °Check-in at 7pm     °       °[facility] °[address] °[place], [place] [address] °336-248-6684 °Men/Women/Women and Children must be there by 7 pm ° °[facility] °[place], [place] °336-722-8721                ° °

## 2021-12-16 NOTE — ED Provider Notes (Signed)
Emergency Department Provider Note   I have reviewed the triage vital signs and the nursing notes.   HISTORY  Chief Complaint Anxiety   HPI Michael [name] is a 46 y.o. male with past history reviewed below including anxiety on Xanax compliant with his medicine presents with increased anxiety symptoms.  He states that he feels like Xanax is making his life worse and the has a Dhiya Smits-term goal of hoping to taper off of this.  He has not engaged with his psychiatrist regarding this.  He has not tried stopping abruptly.  He is not drinking alcohol or using drugs.  He states he has been homeless since March and has a lot of stressors including lack of stable housing and no job.  He adamantly denies any suicidal or homicidal ideation.  No auditory or visual hallucinations.   Past Medical History:  Diagnosis Date   Anxiety    TGA (transient global amnesia)    TIA (transient ischemic attack)     Review of Systems  Constitutional: No fever/chills Eyes: No visual changes. ENT: No sore throat. Cardiovascular: Denies chest pain. Respiratory: Denies shortness of breath. Gastrointestinal: No abdominal pain.  No nausea, no vomiting.  No diarrhea.  No constipation. Genitourinary: Negative for dysuria. Musculoskeletal: Negative for back pain. Skin: Negative for rash. Neurological: Negative for headaches, focal weakness or numbness.   ____________________________________________   PHYSICAL EXAM:  VITAL SIGNS: ED Triage Vitals  Enc Vitals Group     BP 12/16/21 1029 (!) 144/92     Pulse Rate 12/16/21 1029 95     Resp 12/16/21 1029 18     Temp 12/16/21 1029 97.9 F (36.6 C)     Temp Source 12/16/21 1029 Oral     SpO2 12/16/21 1029 99 %   Constitutional: Alert and oriented. Well appearing and in no acute distress. Eyes: Conjunctivae are normal.  Head: Atraumatic. Nose: No congestion/rhinnorhea. Mouth/Throat: Mucous membranes are moist.  Neck: No stridor.   Cardiovascular:  Normal rate, regular rhythm. Good peripheral circulation. Grossly normal heart sounds.   Respiratory: Normal respiratory effort.  No retractions. Lungs CTAB. Gastrointestinal: Soft and nontender. No distention.  Musculoskeletal: No lower extremity tenderness nor edema. No gross deformities of extremities. Neurologic:  Normal speech and language. No gross focal neurologic deficits are appreciated.  Skin:  Skin is warm, dry and intact. No rash noted. Psychiatric: Mood and affect are normal. Speech and behavior are normal.   ____________________________________________   PROCEDURES  Procedure(s) performed:   Procedures  None  ____________________________________________   INITIAL IMPRESSION / ASSESSMENT AND PLAN / ED COURSE  Pertinent labs & imaging results that were available during my care of the patient were reviewed by me and considered in my medical decision making (see chart for details).   This patient is Presenting for Evaluation of anxiety, which does require a range of treatment options, and is a complaint that involves a moderate risk of morbidity and mortality.  The Differential Diagnoses include anxiety, depression, bipolar, psychosis, etc.   I decided to review pertinent External Data, and in summary patient has been seen in the ED previously with anxiety.   Social Determinants of Health Risk patient is currently without stable housing. Denies EtOH or drug use.   Medical Decision Making: Summary:  Patient presents to the emergency department for evaluation of anxiety without suicidal or homicidal ideation.  He is currently compliant with Xanax.  He is not running out of this prescription.  I discussed that while he  desires to come off of this it has to be done under the supervision of a psychiatrist and he cannot stop abruptly as this could be life-threatening.  He verbalizes understanding of this.  I gave him area resources for behavioral health including [facility].  I do  not see an indication for emergency IVC of other psych stabilization at this time.    Disposition: discharge  ____________________________________________  FINAL CLINICAL IMPRESSION(S) / ED DIAGNOSES  Final diagnoses:  Anxiety     Note:  This document was prepared using Dragon voice recognition software and may include unintentional dictation errors.  Alona Bene, MD, [facility]    Josalin Carneiro, Arlyss Repress, MD 12/16/21 1052

## 2021-12-21 ENCOUNTER — Encounter: Payer: Self-pay | Admitting: Neurology

## 2021-12-21 ENCOUNTER — Ambulatory Visit: Payer: Self-pay | Admitting: Neurology

## 2021-12-22 ENCOUNTER — Other Ambulatory Visit (HOSPITAL_COMMUNITY): Payer: Commercial Managed Care - HMO

## 2021-12-22 ENCOUNTER — Telehealth (HOSPITAL_COMMUNITY): Payer: Self-pay | Admitting: Licensed Clinical Social Worker

## 2021-12-22 NOTE — Telephone Encounter (Signed)
Cln emailed WebEx invitation at 8:15 am, signed on at 10:00 am, attempted to call pt at 10:05 am, and remained signed onto WebEx until 10:15 am. Pt failed to sign on for CCA and did not leave vm for PHP staff as of 10:15 am.

## 2022-01-01 ENCOUNTER — Ambulatory Visit (HOSPITAL_COMMUNITY): Payer: Commercial Managed Care - HMO | Admitting: Student in an Organized Health Care Education/Training Program

## 2022-01-03 ENCOUNTER — Emergency Department (HOSPITAL_COMMUNITY)
Admission: EM | Admit: 2022-01-03 | Discharge: 2022-01-03 | Disposition: A | Discharge status: Home / Self Care | Payer: Commercial Managed Care - HMO | Attending: Emergency Medicine | Admitting: Emergency Medicine

## 2022-01-03 ENCOUNTER — Other Ambulatory Visit: Payer: Self-pay

## 2022-01-03 DIAGNOSIS — Z7982 Long term (current) use of aspirin: Secondary | ICD-10-CM | POA: Insufficient documentation

## 2022-01-03 DIAGNOSIS — T40601A Poisoning by unspecified narcotics, accidental (unintentional), initial encounter: Secondary | ICD-10-CM

## 2022-01-03 DIAGNOSIS — T402X1A Poisoning by other opioids, accidental (unintentional), initial encounter: Secondary | ICD-10-CM | POA: Insufficient documentation

## 2022-01-03 NOTE — ED Provider Notes (Signed)
[facility] Provider Note   CSN: [id number] Arrival date & time: 01/03/22  1655     History  Chief Complaint  Patient presents with   Drug Overdose    Michael [name] is a 46 y.o. male.  46 year old male resents after being found unresponsive in a vehicle.   had decreased respirations.  Fire department gave patient intranasal Narcan after patient was found to have pinpoint pupils.  Patient awoke and was alert and oriented.  Patient states that he purchased to pills from a store when she got was kratom.  He denies that this was a suicide attempt.  Feels back to his baseline at this time.  Denies any other medications       Home Medications Prior to Admission medications   Medication Sig Start Date End Date Taking? Authorizing Provider  ALPRAZolam Prudy Feeler) 1 MG tablet Take 1-2 mg by mouth 3 (three) times daily as needed for anxiety.    [provider]  amoxicillin-clavulanate (AUGMENTIN) 875-125 MG per tablet Take 1 tablet by mouth every 12 (twelve) hours. Patient not taking: Reported on 09/14/2021 08/25/14   Catha Gosselin, PA-C  aspirin 81 MG chewable tablet Chew 1 tablet (81 mg total) by mouth daily. Patient not taking: Reported on 09/14/2021 01/16/15   Shon Baton, MD  desvenlafaxine (PRISTIQ) 100 MG 24 hr tablet Take 100 mg by mouth every morning. 09/03/21   [provider]  oxyCODONE (OXY IR/ROXICODONE) 5 MG immediate release tablet Take 0.5-1 tablets (2.5-5 mg total) by mouth every 6 (six) hours as needed for severe pain. Patient not taking: Reported on 09/14/2021 01/29/15   Arthor Captain, PA-C  propranolol (INDERAL) 20 MG tablet Take 20 mg by mouth 3 (three) times daily as needed (for migraines).    [provider]      Allergies    Melatonin and Tylenol pm extra strength [diphenhydramine-apap (sleep)]    Review of Systems   Review of Systems  All other systems reviewed and are negative.   Physical  Exam Updated Vital Signs BP 110/83 (BP Location: Left Arm)   Pulse 71   Temp 97.8 F (36.6 C) (Oral)   Resp 18   SpO2 100%  Physical Exam Vitals and nursing note reviewed.  Constitutional:      General: He is not in acute distress.    Appearance: Normal appearance. He is well-developed. He is not toxic-appearing.  HENT:     Head: Normocephalic and atraumatic.  Eyes:     General: Lids are normal.     Conjunctiva/sclera: Conjunctivae normal.     Pupils: Pupils are equal, round, and reactive to light.  Neck:     Thyroid: No thyroid mass.     Trachea: No tracheal deviation.  Cardiovascular:     Rate and Rhythm: Normal rate and regular rhythm.     Heart sounds: Normal heart sounds. No murmur heard.    No gallop.  Pulmonary:     Effort: Pulmonary effort is normal. No respiratory distress.     Breath sounds: Normal breath sounds. No stridor. No decreased breath sounds, wheezing, rhonchi or rales.  Abdominal:     General: There is no distension.     Palpations: Abdomen is soft.     Tenderness: There is no abdominal tenderness. There is no rebound.  Musculoskeletal:        General: No tenderness. Normal range of motion.     Cervical back: Normal range of motion and neck supple.  Skin:    General: Skin is warm and dry.     Findings: No abrasion or rash.  Neurological:     Mental Status: He is alert and oriented to person, place, and time. Mental status is at baseline.     GCS: GCS eye subscore is 4. GCS verbal subscore is 5. GCS motor subscore is 6.     Cranial Nerves: No cranial nerve deficit.     Sensory: No sensory deficit.     Motor: Motor function is intact.  Psychiatric:        Attention and Perception: Attention normal.        Speech: Speech normal.        Behavior: Behavior normal.     ED Results / Procedures / Treatments   Labs (all labs ordered are listed, but only abnormal results are displayed) Labs Reviewed - No data to display  EKG None  Radiology No  results found.  Procedures Procedures    Medications Ordered in ED Medications - No data to display  ED Course/ Medical Decision Making/ A&P                           Medical Decision Making  Patient monitored here and no signs or rebound opiate effect.  Will discharge home        Final Clinical Impression(s) / ED Diagnoses Final diagnoses:  None    Rx / DC Orders ED Discharge Orders     None         Lorre Nick, MD 01/03/22 1906

## 2022-01-03 NOTE — ED Triage Notes (Signed)
BIB EMS unresponsive in vehicle. Pt received Narcan by Fire IN, Bagged Pinpoint pupils, woke up, now alert and oriented, pt states he bought 2 pills from a "head Shop" for his pain. No SI/Hi 138/68-CBG 212-88- 100% RA

## 2022-11-14 ENCOUNTER — Emergency Department (HOSPITAL_COMMUNITY)
Admission: EM | Admit: 2022-11-14 | Discharge: 2022-11-15 | Disposition: A | Discharge status: Other Acute Inpatient Hospital | Payer: BLUE CROSS/BLUE SHIELD | Attending: Emergency Medicine | Admitting: Emergency Medicine

## 2022-11-14 ENCOUNTER — Encounter (HOSPITAL_COMMUNITY): Payer: Self-pay

## 2022-11-14 ENCOUNTER — Other Ambulatory Visit: Payer: Self-pay

## 2022-11-14 DIAGNOSIS — F332 Major depressive disorder, recurrent severe without psychotic features: Secondary | ICD-10-CM | POA: Insufficient documentation

## 2022-11-14 DIAGNOSIS — F410 Panic disorder [episodic paroxysmal anxiety] without agoraphobia: Secondary | ICD-10-CM | POA: Diagnosis not present

## 2022-11-14 DIAGNOSIS — F411 Generalized anxiety disorder: Secondary | ICD-10-CM

## 2022-11-14 DIAGNOSIS — F32A Depression, unspecified: Secondary | ICD-10-CM

## 2022-11-14 DIAGNOSIS — Z5900 Homelessness unspecified: Secondary | ICD-10-CM | POA: Insufficient documentation

## 2022-11-14 DIAGNOSIS — R45851 Suicidal ideations: Secondary | ICD-10-CM | POA: Insufficient documentation

## 2022-11-14 LAB — CBC
HCT: 44.5 % (ref 39.0–52.0)
Hemoglobin: 14.9 g/dL (ref 13.0–17.0)
MCH: 32.5 pg (ref 26.0–34.0)
MCHC: 33.5 g/dL (ref 30.0–36.0)
MCV: 96.9 fL (ref 80.0–100.0)
Platelets: 319 10*3/uL (ref 150–400)
RBC: 4.59 MIL/uL (ref 4.22–5.81)
RDW: 12.8 % (ref 11.5–15.5)
WBC: 4.7 10*3/uL (ref 4.0–10.5)
nRBC: 0 % (ref 0.0–0.2)

## 2022-11-14 LAB — COMPREHENSIVE METABOLIC PANEL
ALT: 11 U/L (ref 0–44)
AST: 12 U/L — ABNORMAL LOW (ref 15–41)
Albumin: 4.2 g/dL (ref 3.5–5.0)
Alkaline Phosphatase: 59 U/L (ref 38–126)
Anion gap: 9 (ref 5–15)
BUN: 7 mg/dL (ref 6–20)
CO2: 27 mmol/L (ref 22–32)
Calcium: 8.8 mg/dL — ABNORMAL LOW (ref 8.9–10.3)
Chloride: 100 mmol/L (ref 98–111)
Creatinine, Ser: 0.84 mg/dL (ref 0.61–1.24)
GFR, Estimated: >60 mL/min (ref >60)
Glucose, Bld: 117 mg/dL — ABNORMAL HIGH (ref 70–99)
Potassium: 4 mmol/L (ref 3.5–5.1)
Sodium: 136 mmol/L (ref 135–145)
Total Bilirubin: 1.4 mg/dL — ABNORMAL HIGH (ref 0.3–1.2)
Total Protein: 7.7 g/dL (ref 6.5–8.1)

## 2022-11-14 LAB — RAPID URINE DRUG SCREEN, HOSP PERFORMED
Amphetamines: NOT DETECTED
Barbiturates: NOT DETECTED
Benzodiazepines: POSITIVE — AB
Cocaine: NOT DETECTED
Opiates: NOT DETECTED
Tetrahydrocannabinol: NOT DETECTED

## 2022-11-14 LAB — ETHANOL: Alcohol, Ethyl (B): <10 mg/dL (ref <10)

## 2022-11-14 MED ORDER — VENLAFAXINE HCL ER 75 MG PO CP24
75.0000 mg | ORAL_CAPSULE | Freq: Every day | ORAL | Status: DC
  Administered 2022-11-15: 75 mg via ORAL
  Filled 2022-11-14: qty 1

## 2022-11-14 MED ORDER — ALPRAZOLAM 0.5 MG PO TABS
1.0000 mg | ORAL_TABLET | Freq: Three times a day (TID) | ORAL | Status: DC | PRN
  Administered 2022-11-15 (×2): 2 mg via ORAL
  Filled 2022-11-14 (×2): qty 4

## 2022-11-14 NOTE — ED Notes (Signed)
Pt dressed out into burgundy scrubs, his belongings located in cabinet behind nurses station 23-25

## 2022-11-14 NOTE — Consult Note (Signed)
BH ED ASSESSMENT    Reason for Consult: Psych Consult Referring Physician:  Evlyn Kanner, PA-C Patient Identification: Michael [name] MRN:  [id number] ED Chief Complaint: Major depressive disorder, recurrent, severe without psychotic features (HCC)  Diagnosis:  Principal Problem:   Major depressive disorder, recurrent, severe without psychotic features (HCC) Active Problems:   Homelessness   Panic disorder   ED Assessment Time Calculation: Start Time: 1645 Stop Time: 1725 Total Time in Minutes (Assessment Completion): 40   Subjective: Michael [name] is a 47 y.o. male patient with a history of MDD presented with chronic depression and SI without plan. Patient is that he would take a bunch of pills and go to sleep and that he thinks about this daily.    HPI: Michael [name], 47 y.o., male patient seen face to face by this provider, consulted with Dr. Jannifer Franklin; and chart reviewed on 11/14/22.  On evaluation LAM MCCUBBINS reports he tired of pretending everything is ok, and he has been overwhelmed with life (says he loss his job and he has been homeless for about a year). He states he has a daughter who will turn 12 in 3 days, and he has nothing to give her. He states he has no family support his mother and father passed away, and his older brother passed away recently. He states he has been isolating, which makes him nervous because he is starting to detach from reality. He states he has been applying to jobs, but has not had any luck. He states that his appetite and sleep have been poor, due to being homeless and not really having anywhere to stay, he has been living in his storage. Patient denies HI/AVH, and endorses SI with a plan of "taking a bunch of pills".  He states his last psychiatric inpatient hospitalization was at the age of 80, due to having severe panic attacks.  Patient states that he takes Xanax and Pristiq for depression and anxiety.  He states he has been taking Xanax for  about 25 years, has been having thoughts off and on to stop medication, this provider encouraged him to taper the medication and that this actually stopping, also informed him that he needs a psychiatric provider to help him wean off the medication.  Patient states that he is currently not interested in stopping his Xanax, but is more interested in receiving help for depression and suicidal thoughts.  Patient meets criteria for inpatient mental health care.  We will resume home medications while we seek bed placement.    Past Psychiatric History: Depression, anxiety.  Outpatient Psychiatrist is [employer].  He has been taking Xanax, Pristiqe, Propranolol   Risk to Self or Others: Is the patient at risk to self? No Has the patient been a risk to self in the past 6 months? No Has the patient been a risk to self within the distant past? No Is the patient a risk to others? No Has the patient been a risk to others in the past 6 months? No Has the patient been a risk to others within the distant past? No    [place] Scale:  Flowsheet Row ED from 11/14/2022 in [facility] Emergency Department at [facility] ED from 01/03/2022 in [facility] Emergency Department at [facility] ED from 12/16/2021 in [facility] Emergency Department at [facility]  C-SSRS RISK CATEGORY Low Risk No Risk No Risk       AIMS:  , , ,  ,  ASAM:    Substance Abuse:     Past Medical History:  Past Medical History:  Diagnosis Date   Anxiety    TGA (transient global amnesia)    TIA (transient ischemic attack)    History reviewed. No pertinent surgical history. Family History: History reviewed. No pertinent family history.  Social History:  Social History   Substance and Sexual Activity  Alcohol Use Not Currently   Comment: rarely     Social History   Substance and Sexual Activity  Drug Use No    Social History   Socioeconomic History   Marital status: Single    Spouse  name: Not on file   Number of children: Not on file   Years of education: Not on file   Highest education level: Not on file  Occupational History   Not on file  Tobacco Use   Smoking status: Former    Current packs/day: 0.50    Average packs/day: 0.5 packs/day for 20.0 years (10.0 ttl pk-yrs)    Types: Cigarettes   Smokeless tobacco: Not on file  Vaping Use   Vaping status: Every Day   Substances: Nicotine, Flavoring  Substance and Sexual Activity   Alcohol use: Not Currently    Comment: rarely   Drug use: No   Sexual activity: Yes    Birth control/protection: None  Other Topics Concern   Not on file  Social History Narrative   Not on file   Social Determinants of Health   Financial Resource Strain: Not on file  Food Insecurity: Not on file  Transportation Needs: Not on file  Physical Activity: Not on file  Stress: Not on file  Social Connections: Unknown (09/01/2022)   Received from [facility]   Social Network    Social Network: Not on file      Allergies:   Allergies  Allergen Reactions   Melatonin Other (See Comments)    Did not help the patient achieve drowsiness and made the legs "jerk"    Neurontin [Gabapentin] Other (See Comments)    Made the patient feel "spacey"   Trazodone And Nefazodone Other (See Comments)    Too narrow of a window of time to fall asleep and caused excessive hunger   Tylenol Pm Extra Strength [Diphenhydramine-Apap (Sleep)] Other (See Comments)    Did not help the patient achieve drowsiness and made the legs "jerk"     Labs:  Results for orders placed or performed during the hospital encounter of 11/14/22 (from the past 48 hour(s))  Comprehensive metabolic panel     Status: Abnormal   Collection Time: 11/14/22 12:52 PM  Result Value Ref Range   Sodium 136 135 - 145 mmol/L   Potassium 4.0 3.5 - 5.1 mmol/L   Chloride 100 98 - 111 mmol/L   CO2 27 22 - 32 mmol/L   Glucose, Bld 117 (H) 70 - 99 mg/dL    Comment: Glucose reference  range applies only to samples taken after fasting for at least 8 hours.   BUN 7 6 - 20 mg/dL   Creatinine, Ser 7.84 0.61 - 1.24 mg/dL   Calcium 8.8 (L) 8.9 - 10.3 mg/dL   Total Protein 7.7 6.5 - 8.1 g/dL   Albumin 4.2 3.5 - 5.0 g/dL   AST 12 (L) 15 - 41 U/L   ALT 11 0 - 44 U/L   Alkaline Phosphatase 59 38 - 126 U/L   Total Bilirubin 1.4 (H) 0.3 - 1.2 mg/dL   GFR, Estimated >69 >  60 mL/min    Comment: (NOTE) Calculated using the CKD-EPI Creatinine Equation (2021)    Anion gap 9 5 - 15    Comment: Performed at [facility], [address] [place], [place] [address]  cbc     Status: None   Collection Time: 11/14/22 12:52 PM  Result Value Ref Range   WBC 4.7 4.0 - 10.5 K/uL   RBC 4.59 4.22 - 5.81 MIL/uL   Hemoglobin 14.9 13.0 - 17.0 g/dL   HCT 32.4 40.1 - 02.7 %   MCV 96.9 80.0 - 100.0 fL   MCH 32.5 26.0 - 34.0 pg   MCHC 33.5 30.0 - 36.0 g/dL   RDW 25.3 66.4 - 40.3 %   Platelets 319 150 - 400 K/uL   nRBC 0.0 0.0 - 0.2 %    Comment: Performed at [facility], [address] [place], [place] [address]  Ethanol     Status: None   Collection Time: 11/14/22 12:56 PM  Result Value Ref Range   Alcohol, Ethyl (B) <10 <10 mg/dL    Comment: (NOTE) Lowest detectable limit for serum alcohol is 10 mg/dL.  For medical purposes only. Performed at [facility], [address], [place], [place] [address]   Rapid urine drug screen (hospital performed)     Status: Abnormal   Collection Time: 11/14/22  2:32 PM  Result Value Ref Range   Opiates NONE DETECTED NONE DETECTED   Cocaine NONE DETECTED NONE DETECTED   Benzodiazepines POSITIVE (A) NONE DETECTED   Amphetamines NONE DETECTED NONE DETECTED   Tetrahydrocannabinol NONE DETECTED NONE DETECTED   Barbiturates NONE DETECTED NONE DETECTED    Comment: (NOTE) DRUG SCREEN FOR MEDICAL PURPOSES ONLY.  IF CONFIRMATION IS NEEDED FOR ANY PURPOSE, NOTIFY LAB WITHIN 5 DAYS.  LOWEST  DETECTABLE LIMITS FOR URINE DRUG SCREEN Drug Class                     Cutoff (ng/mL) Amphetamine and metabolites    1000 Barbiturate and metabolites    200 Benzodiazepine                 200 Opiates and metabolites        300 Cocaine and metabolites        300 THC                            50 Performed at [facility], [address], [place], [place] [address]     No current facility-administered medications for this encounter.   Current Outpatient Medications  Medication Sig Dispense Refill   acetaminophen (TYLENOL) 500 MG tablet Take 500-1,000 mg by mouth every 6 (six) hours as needed for mild pain or headache.     ALPRAZolam (XANAX) 1 MG tablet Take 1-2 mg by mouth 3 (three) times daily as needed for anxiety.     amphetamine-dextroamphetamine (ADDERALL) 20 MG tablet Take 20 mg by mouth 3 (three) times daily.     DESCOVY 200-25 MG tablet Take 1 tablet by mouth daily.     desvenlafaxine (PRISTIQ) 100 MG 24 hr tablet Take 100 mg by mouth every morning.     doxycycline (ADOXA) 100 MG tablet Take 200 mg by mouth See admin instructions. "Take 200 mg (2 tablets) by mouth up to 72 hours after intercourse. Do not exceed more than 200 mg (two tablets) in 24 hrs."  VISINE DRY EYE RELIEF 1 % SOLN Apply 1 drop to eye 2 (two) times daily as needed (for irritation).     amoxicillin-clavulanate (AUGMENTIN) 875-125 MG per tablet Take 1 tablet by mouth every 12 (twelve) hours. (Patient not taking: Reported on 11/14/2022) 14 tablet 0   aspirin 81 MG chewable tablet Chew 1 tablet (81 mg total) by mouth daily. (Patient not taking: Reported on 11/14/2022) 30 tablet 0   oxyCODONE (OXY IR/ROXICODONE) 5 MG immediate release tablet Take 0.5-1 tablets (2.5-5 mg total) by mouth every 6 (six) hours as needed for severe pain. (Patient not taking: Reported on 11/14/2022) 20 tablet 0   propranolol (INDERAL) 20 MG tablet Take 20 mg by mouth 3 (three) times daily as needed (for migraines).       Musculoskeletal: Strength & Muscle Tone: within normal limits Gait & Station: normal Patient leans: N/A   Psychiatric Specialty Exam: Presentation  General Appearance:  Appropriate for Environment  Eye Contact: Good  Speech: Clear and Coherent  Speech Volume: Normal  Handedness: Right   Mood and Affect  Mood: Anxious; Depressed; Hopeless  Affect: Appropriate   Thought Process  Thought Processes: Coherent  Descriptions of Associations:Intact  Orientation:Full (Time, Place and Person)  Thought Content:WDL  History of Schizophrenia/Schizoaffective disorder:No data recorded Duration of Psychotic Symptoms:No data recorded Hallucinations:Hallucinations: None  Ideas of Reference:None  Suicidal Thoughts:Suicidal Thoughts: Yes, Active SI Active Intent and/or Plan: With Plan  Homicidal Thoughts:Homicidal Thoughts: No   Sensorium  Memory: Immediate Good; Recent Good  Judgment: Poor  Insight: Fair   Chartered certified accountant: Fair  Attention Span: Fair  Recall: [employer] of Knowledge: Fair  Language: Fair   Psychomotor Activity  Psychomotor Activity: Psychomotor Activity: Normal   Assets  Assets: Manufacturing systems engineer; Social Support    Sleep  Sleep: Sleep: Poor   Physical Exam: Physical Exam Vitals and nursing note reviewed. Exam conducted with a chaperone present.  Neurological:     Mental Status: He is alert.  Psychiatric:        Attention and Perception: Attention normal.        Mood and Affect: Mood is depressed. Affect is flat.        Speech: Speech normal.        Behavior: Behavior is cooperative.        Thought Content: Thought content includes suicidal ideation. Thought content includes suicidal plan.        Cognition and Memory: Memory normal.        Judgment: Judgment is inappropriate.    Review of Systems  Constitutional: Negative.   Psychiatric/Behavioral:  Positive for depression and  suicidal ideas.    Blood pressure 119/80, pulse (!) 105, temperature 98.3 F (36.8 C), temperature source Oral, resp. rate 17, height 6\' 2"  (1.88 m), weight 75.3 kg, SpO2 99%. Body mass index is 21.31 kg/m.  Medical Decision Making: Patient will be admitted for treatment of depression and suicidal thoughts. We will seek bed placement while we resume home medications.  Xanax, Venlafaxine and Hydroxyzine is prescribed with Trazodone.   Problem 1: Recurrent Major Depressive disorder, severe without psychotic features.   Problem 2: Generalized anxiety disorder   Disposition:  Admit, seek bed placement.  Alona Bene, PMHNP 11/14/2022 5:39 PM

## 2022-11-14 NOTE — ED Provider Notes (Signed)
[place] EMERGENCY DEPARTMENT AT [facility] Provider Note   CSN: [id number] Arrival date & time: 11/14/22  1213     History  Chief Complaint  Patient presents with   Depression   Suicidal    Michael [name] is a 47 y.o. male history of MDD presented with chronic depression and SI without plan.  Patient is that he would take a bunch of pills and go to sleep and that he thinks about this daily.  Patient is supposed to be taking his alprazolam however is unable to afford the medication.  Patient is taking his olanzapine.  Patient feels depressed and out of touch with reality as he feels overwhelmed.  Patient does not feel that he is a part of this world.  Patient does not have thoughts of hurting others.  Patient states he has chest pain on the right side of his chest that hurts when he presses on it but has not taken any medications for this and is not concerned about his heart.  Patient denies shortness of breath, nausea/vomiting, new medications, abdominal pain, fevers, recent illness  Home Medications Prior to Admission medications   Medication Sig Start Date End Date Taking? Authorizing Provider  ALPRAZolam Prudy Feeler) 1 MG tablet Take 1-2 mg by mouth 3 (three) times daily as needed for anxiety.    [provider]  amoxicillin-clavulanate (AUGMENTIN) 875-125 MG per tablet Take 1 tablet by mouth every 12 (twelve) hours. Patient not taking: Reported on 09/14/2021 08/25/14   Catha Gosselin, PA-C  aspirin 81 MG chewable tablet Chew 1 tablet (81 mg total) by mouth daily. Patient not taking: Reported on 09/14/2021 01/16/15   Shon Baton, MD  desvenlafaxine (PRISTIQ) 100 MG 24 hr tablet Take 100 mg by mouth every morning. 09/03/21   [provider]  oxyCODONE (OXY IR/ROXICODONE) 5 MG immediate release tablet Take 0.5-1 tablets (2.5-5 mg total) by mouth every 6 (six) hours as needed for severe pain. Patient not taking: Reported on 09/14/2021 01/29/15   Arthor Captain, PA-C  propranolol (INDERAL) 20 MG tablet Take 20 mg by mouth 3 (three) times daily as needed (for migraines).    [provider]      Allergies    Melatonin and Tylenol pm extra strength [diphenhydramine-apap (sleep)]    Review of Systems   Review of Systems  Physical Exam Updated Vital Signs BP 119/80 (BP Location: Left Arm)   Pulse (!) 105   Temp 98.3 F (36.8 C) (Oral)   Resp 17   Ht 6\' 2"  (1.88 m)   Wt 75.3 kg   SpO2 99%   BMI 21.31 kg/m  Physical Exam Vitals reviewed.  Constitutional:      General: He is not in acute distress. HENT:     Head: Normocephalic and atraumatic.  Eyes:     Extraocular Movements: Extraocular movements intact.     Conjunctiva/sclera: Conjunctivae normal.     Pupils: Pupils are equal, round, and reactive to light.  Cardiovascular:     Rate and Rhythm: Normal rate and regular rhythm.     Pulses: Normal pulses.     Heart sounds: Normal heart sounds.     Comments: 2+ bilateral radial/dorsalis pedis pulses with regular rate Pulmonary:     Effort: Pulmonary effort is normal. No respiratory distress.     Breath sounds: Normal breath sounds.  Abdominal:     Palpations: Abdomen is soft.     Tenderness: There is no abdominal tenderness. There is no  guarding or rebound.  Musculoskeletal:        General: Normal range of motion.     Cervical back: Normal range of motion and neck supple.     Comments: 5 out of 5 bilateral grip/leg extension strength Able to reproduce patient's chest pain with palpation No bony or muscular abnormalities noted  Skin:    General: Skin is warm and dry.     Capillary Refill: Capillary refill takes less than 2 seconds.  Neurological:     General: No focal deficit present.     Mental Status: He is alert and oriented to person, place, and time.     Comments: Sensation intact in all 4 limbs  Psychiatric:        Mood and Affect: Mood normal.     ED Results / Procedures / Treatments   Labs (all  labs ordered are listed, but only abnormal results are displayed) Labs Reviewed  COMPREHENSIVE METABOLIC PANEL - Abnormal; Notable for the following components:      Result Value   Glucose, Bld 117 (*)    Calcium 8.8 (*)    AST 12 (*)    Total Bilirubin 1.4 (*)    All other components within normal limits  RAPID URINE DRUG SCREEN, HOSP PERFORMED - Abnormal; Notable for the following components:   Benzodiazepines POSITIVE (*)    All other components within normal limits  ETHANOL  CBC    EKG None  Radiology No results found.  Procedures Procedures    Medications Ordered in ED Medications - No data to display  ED Course/ Medical Decision Making/ A&P                                 Medical Decision Making Amount and/or Complexity of Data Reviewed Labs: ordered.   Michael [name] 47 y.o. presented today for medical clearance. Working DDx that I considered at this time includes, but not limited to, depression, SI/HI, electrolyte abnormalities, anemia, dehydration, medication induced/withdraw.  R/o DDx: Electrolyte abnormalities, anemia, dehydration: These are considered less likely due to history of present illness, physical exam, labs/imaging findings  Review of prior external notes: 09/01/2022 ED  Unique Tests and My Interpretation:  UDS: Benzodiazepine positive CBC: Unremarkable Ethanol: Negative CMP: Unremarkable  Discussion with Independent Historian: None  Discussion of Management of Tests:  None  Risk: Low: based on diagnostic testing/clinical impression and treatment plan  Risk Stratification Score: None  Plan: On exam patient was in no acute distress stable vitals.  Patient's medical clearance labs from triage are unremarkable.  Patient did have muscular tenderness on his chest and when I offered to do a troponin he declined at this time as he does not believe is related to his heart.  Patient understands that this may lead to a missed diagnosis however  he does have decision-making capacity at this time.  Patient states he wants to speak to the psych team due to his ongoing depression and mental symptoms.  TTS will be consulted as patient is medically cleared.  Patient does not want any Tylenol for his chest pain.  Suspect patient's depression and out of touch with reality is related to being unable to afford his alprazolam.  Patient was given return precautions. Patient stable for discharge at this time.  Patient verbalized understanding of plan.         Final Clinical Impression(s) / ED Diagnoses Final diagnoses:  Suicidal ideation  Depression, unspecified depression type    Rx / DC Orders ED Discharge Orders     None         Remi Deter 11/14/22 1548    Alvira Monday, MD 11/15/22 2212

## 2022-11-14 NOTE — ED Triage Notes (Signed)
Patient reports ongoing depression and SI without a plan. Patient reports he does not feel in touch with reality. Has been unable to afford his psych medications.

## 2022-11-14 NOTE — ED Notes (Signed)
Patient to room 30. Patient ambulated to room. Patient was oriented to unit and room. Patient calm and cooperative. Patient was given food and drink.

## 2022-11-15 ENCOUNTER — Inpatient Hospital Stay (HOSPITAL_COMMUNITY)
Admission: AD | Admit: 2022-11-15 | Discharge: 2022-11-25 | DRG: 885 | Disposition: A | Discharge status: Home / Self Care | Payer: BLUE CROSS/BLUE SHIELD | Source: Intra-hospital | Attending: Psychiatry | Admitting: Psychiatry

## 2022-11-15 ENCOUNTER — Other Ambulatory Visit: Payer: Self-pay

## 2022-11-15 ENCOUNTER — Encounter (HOSPITAL_COMMUNITY): Payer: Self-pay | Admitting: Nurse Practitioner

## 2022-11-15 DIAGNOSIS — F17290 Nicotine dependence, other tobacco product, uncomplicated: Secondary | ICD-10-CM | POA: Diagnosis present

## 2022-11-15 DIAGNOSIS — Z811 Family history of alcohol abuse and dependence: Secondary | ICD-10-CM | POA: Diagnosis not present

## 2022-11-15 DIAGNOSIS — R45851 Suicidal ideations: Secondary | ICD-10-CM | POA: Diagnosis present

## 2022-11-15 DIAGNOSIS — Z7982 Long term (current) use of aspirin: Secondary | ICD-10-CM | POA: Diagnosis not present

## 2022-11-15 DIAGNOSIS — Z62810 Personal history of physical and sexual abuse in childhood: Secondary | ICD-10-CM | POA: Diagnosis not present

## 2022-11-15 DIAGNOSIS — Z5900 Homelessness unspecified: Secondary | ICD-10-CM | POA: Diagnosis not present

## 2022-11-15 DIAGNOSIS — F411 Generalized anxiety disorder: Secondary | ICD-10-CM | POA: Diagnosis present

## 2022-11-15 DIAGNOSIS — F332 Major depressive disorder, recurrent severe without psychotic features: Secondary | ICD-10-CM | POA: Diagnosis present

## 2022-11-15 DIAGNOSIS — Z5941 Food insecurity: Secondary | ICD-10-CM

## 2022-11-15 DIAGNOSIS — Z638 Other specified problems related to primary support group: Secondary | ICD-10-CM | POA: Diagnosis not present

## 2022-11-15 DIAGNOSIS — Z62811 Personal history of psychological abuse in childhood: Secondary | ICD-10-CM | POA: Diagnosis not present

## 2022-11-15 DIAGNOSIS — Z5986 Financial insecurity: Secondary | ICD-10-CM | POA: Diagnosis not present

## 2022-11-15 DIAGNOSIS — F17210 Nicotine dependence, cigarettes, uncomplicated: Secondary | ICD-10-CM | POA: Diagnosis present

## 2022-11-15 DIAGNOSIS — T43596A Underdosing of other antipsychotics and neuroleptics, initial encounter: Secondary | ICD-10-CM | POA: Diagnosis present

## 2022-11-15 DIAGNOSIS — Z9151 Personal history of suicidal behavior: Secondary | ICD-10-CM | POA: Diagnosis not present

## 2022-11-15 DIAGNOSIS — Z91120 Patient's intentional underdosing of medication regimen due to financial hardship: Secondary | ICD-10-CM | POA: Diagnosis not present

## 2022-11-15 DIAGNOSIS — Z79899 Other long term (current) drug therapy: Secondary | ICD-10-CM

## 2022-11-15 DIAGNOSIS — F1320 Sedative, hypnotic or anxiolytic dependence, uncomplicated: Secondary | ICD-10-CM | POA: Diagnosis present

## 2022-11-15 DIAGNOSIS — F13930 Sedative, hypnotic or anxiolytic use, unspecified with withdrawal, uncomplicated: Secondary | ICD-10-CM

## 2022-11-15 DIAGNOSIS — Z8673 Personal history of transient ischemic attack (TIA), and cerebral infarction without residual deficits: Secondary | ICD-10-CM

## 2022-11-15 DIAGNOSIS — Z560 Unemployment, unspecified: Secondary | ICD-10-CM | POA: Diagnosis not present

## 2022-11-15 DIAGNOSIS — Z5982 Transportation insecurity: Secondary | ICD-10-CM

## 2022-11-15 DIAGNOSIS — F410 Panic disorder [episodic paroxysmal anxiety] without agoraphobia: Secondary | ICD-10-CM | POA: Diagnosis present

## 2022-11-15 DIAGNOSIS — R519 Headache, unspecified: Principal | ICD-10-CM

## 2022-11-15 MED ORDER — HALOPERIDOL LACTATE 5 MG/ML IJ SOLN: 5.0000 mg | Freq: Three times a day (TID) | INTRAMUSCULAR | Status: DC | PRN

## 2022-11-15 MED ORDER — DIPHENHYDRAMINE HCL 25 MG PO CAPS: 50.0000 mg | ORAL_CAPSULE | Freq: Three times a day (TID) | ORAL | Status: DC | PRN

## 2022-11-15 MED ORDER — ALUM & MAG HYDROXIDE-SIMETH 200-200-20 MG/5ML PO SUSP: 30.0000 mL | ORAL | Status: DC | PRN

## 2022-11-15 MED ORDER — ALPRAZOLAM 0.5 MG PO TABS
1.0000 mg | ORAL_TABLET | Freq: Three times a day (TID) | ORAL | Status: DC | PRN
  Administered 2022-11-15 (×2): 2 mg via ORAL
  Administered 2022-11-16: 1 mg via ORAL
  Filled 2022-11-15 (×2): qty 4
  Filled 2022-11-15: qty 2

## 2022-11-15 MED ORDER — HALOPERIDOL 5 MG PO TABS: 5.0000 mg | ORAL_TABLET | Freq: Three times a day (TID) | ORAL | Status: DC | PRN

## 2022-11-15 MED ORDER — NICOTINE POLACRILEX 2 MG MT GUM
2.0000 mg | CHEWING_GUM | OROMUCOSAL | Status: DC | PRN
  Administered 2022-11-15 – 2022-11-20 (×4): 2 mg via ORAL
  Filled 2022-11-15 (×2): qty 1

## 2022-11-15 MED ORDER — DIPHENHYDRAMINE HCL 50 MG/ML IJ SOLN: 50.0000 mg | Freq: Three times a day (TID) | INTRAMUSCULAR | Status: DC | PRN

## 2022-11-15 MED ORDER — NICOTINE POLACRILEX 2 MG MT GUM
CHEWING_GUM | OROMUCOSAL | Status: AC
  Administered 2022-11-15: 2 mg
  Filled 2022-11-15: qty 1

## 2022-11-15 MED ORDER — VENLAFAXINE HCL ER 75 MG PO CP24
75.0000 mg | ORAL_CAPSULE | Freq: Every day | ORAL | Status: DC
  Administered 2022-11-16: 75 mg via ORAL
  Filled 2022-11-15 (×3): qty 1

## 2022-11-15 NOTE — ED Provider Notes (Signed)
Emergency Medicine Observation Re-evaluation Note  Michael [name] is a 47 y.o. male, seen on rounds today.  Pt initially presented to the ED for complaints of Depression and Suicidal Currently, the patient is awaiting psychiatric admission.  Physical Exam  BP 95/73 (BP Location: Left Arm)   Pulse 68   Temp 97.7 F (36.5 C) (Oral)   Resp 16   Ht 6\' 2"  (1.88 m)   Wt 75.3 kg   SpO2 98%   BMI 21.31 kg/m  Physical Exam Alert and in no acute distress ED Course / MDM  EKG:   I have reviewed the labs performed to date as well as medications administered while in observation.  Recent changes in the last 24 hours include none.  Plan  Current plan is for psychiatric admission for suicidal ideation.    Bethann Berkshire, MD 11/15/22 [phone]

## 2022-11-15 NOTE — ED Notes (Signed)
Belongings moved to locker 39

## 2022-11-15 NOTE — [facility] Group Notes (Signed)
[facility] Group Notes:  (Nursing/MHT/Case Management/Adjunct)  Date:  11/15/2022  Time:  9:34 PM  Type of Therapy:   Wrap-up group  Participation Level:  Did Not Attend  Participation Quality:    Affect:    Cognitive:    Insight:    Engagement in Group:    Modes of Intervention:    Summary of Progress/Problems: refused to attend group.  Noah Delaine 11/15/2022, 9:34 PM

## 2022-11-15 NOTE — Progress Notes (Signed)
Pt admitted today from [facility]. Pt reports multiple stressors including homelessness, no job, and no support system. Pt reports feeling overwhelmed with intermittent passive suicidal thoughts. Pt denies alcohol or substance use. Pt endorses anxiety. Pt has struggled to keep appointments and fill medications due to lack of money and resources. Pt denies HI and AVH.

## 2022-11-15 NOTE — Progress Notes (Signed)
Pt has been accepted to [facility] TODAY 11/15/2022, pending voluntary consent. Bed assignment: 403-1  Pt meets inpatient criteria per Dahlia Byes, NP  Attending Physician will be Nadir Abbott Pao, MD  Report can be called to: - Adult unit: [phone]  Pt can arrive after pending items are received  Care Team Notified: [facility] Rona Ravens, RN, Thornton Dales, RN, Lorella Nimrod, RN, Dahlia Byes, NP, and [facility], Paramedic  Biggs, MSW, [place]  11/15/2022 12:35 PM

## 2022-11-16 ENCOUNTER — Encounter (HOSPITAL_COMMUNITY): Payer: Self-pay | Admitting: Nurse Practitioner

## 2022-11-16 MED ORDER — LORAZEPAM 1 MG PO TABS
1.0000 mg | ORAL_TABLET | Freq: Two times a day (BID) | ORAL | Status: AC
  Administered 2022-11-18 (×2): 1 mg via ORAL
  Filled 2022-11-16 (×2): qty 1

## 2022-11-16 MED ORDER — HYDROXYZINE HCL 25 MG PO TABS
25.0000 mg | ORAL_TABLET | Freq: Four times a day (QID) | ORAL | Status: AC | PRN
  Administered 2022-11-16 – 2022-11-18 (×4): 25 mg via ORAL
  Filled 2022-11-16 (×4): qty 1

## 2022-11-16 MED ORDER — LORAZEPAM 1 MG PO TABS
1.0000 mg | ORAL_TABLET | Freq: Four times a day (QID) | ORAL | Status: AC
  Administered 2022-11-16 (×3): 1 mg via ORAL
  Filled 2022-11-16 (×3): qty 1

## 2022-11-16 MED ORDER — LORAZEPAM 1 MG PO TABS
1.0000 mg | ORAL_TABLET | Freq: Every day | ORAL | Status: AC
  Administered 2022-11-19: 1 mg via ORAL
  Filled 2022-11-16: qty 1

## 2022-11-16 MED ORDER — BUSPIRONE HCL 10 MG PO TABS
10.0000 mg | ORAL_TABLET | Freq: Two times a day (BID) | ORAL | Status: DC
  Administered 2022-11-16 – 2022-11-25 (×18): 10 mg via ORAL
  Filled 2022-11-16: qty 1
  Filled 2022-11-16 (×2): qty 2
  Filled 2022-11-16 (×24): qty 1

## 2022-11-16 MED ORDER — ADULT MULTIVITAMIN W/MINERALS CH
1.0000 | ORAL_TABLET | Freq: Every day | ORAL | Status: DC
  Administered 2022-11-16 – 2022-11-25 (×10): 1 via ORAL
  Filled 2022-11-16 (×13): qty 1

## 2022-11-16 MED ORDER — NICOTINE 21 MG/24HR TD PT24
21.0000 mg | MEDICATED_PATCH | Freq: Every day | TRANSDERMAL | Status: DC
  Administered 2022-11-16 – 2022-11-25 (×9): 21 mg via TRANSDERMAL
  Filled 2022-11-16 (×16): qty 1

## 2022-11-16 MED ORDER — VENLAFAXINE HCL ER 75 MG PO CP24
75.0000 mg | ORAL_CAPSULE | Freq: Every day | ORAL | Status: DC
  Administered 2022-11-17 – 2022-11-21 (×5): 75 mg via ORAL
  Filled 2022-11-16 (×8): qty 1

## 2022-11-16 MED ORDER — LORAZEPAM 1 MG PO TABS
1.0000 mg | ORAL_TABLET | Freq: Three times a day (TID) | ORAL | Status: AC
  Administered 2022-11-17 (×3): 1 mg via ORAL
  Filled 2022-11-16 (×3): qty 1

## 2022-11-16 MED ORDER — ONDANSETRON 4 MG PO TBDP: 4.0000 mg | ORAL_TABLET | Freq: Four times a day (QID) | ORAL | Status: AC | PRN

## 2022-11-16 MED ORDER — LORAZEPAM 1 MG PO TABS: 1.0000 mg | ORAL_TABLET | Freq: Four times a day (QID) | ORAL | Status: AC | PRN

## 2022-11-16 MED ORDER — LOPERAMIDE HCL 2 MG PO CAPS: 2.0000 mg | ORAL_CAPSULE | ORAL | Status: AC | PRN

## 2022-11-16 NOTE — [facility] Group Notes (Signed)
[facility] Group Notes:  (Nursing/MHT/Case Management/Adjunct)  Date:  11/16/2022  Time:  2:13 PM  Type of Therapy:  Psychoeducational Skills  Participation Level:  Did Not Attend    Summary of Progress/Problems: Pt did not attend afternoon nursing group.  Malva Limes 11/16/2022, 2:13 PM

## 2022-11-16 NOTE — Progress Notes (Signed)
Patient appears irritable. Patient denies SI/HI/AVH. Pt reports anxiety is 4/10 and depression is 7/10. Pt reports poor sleep and good appetite. Pt was given PRN xanax for anxiety 1mg . Pt reports he takes xanax at home as well as adderall. Pt is asking the adderall be restarted, MD notified. Patient complied with morning medication with no reported side effects. Patient remains safe on Q86min checks and contracts for safety.      11/16/22 1006  Psych Admission Type (Psych Patients Only)  Admission Status Voluntary  Psychosocial Assessment  Patient Complaints Anxiety;Depression;Sleep disturbance  Eye Contact Fair  Facial Expression Animated;Anxious  Affect Depressed  Speech Logical/coherent  Interaction Assertive  Motor Activity Fidgety  Appearance/Hygiene Unremarkable  Behavior Characteristics Cooperative;Anxious  Mood Anxious;Depressed  Thought Process  Coherency WDL  Content WDL  Delusions None reported or observed  Perception WDL  Hallucination None reported or observed  Judgment Impaired  Confusion None  Danger to Self  Current suicidal ideation? Denies  Danger to Others  Danger to Others None reported or observed

## 2022-11-16 NOTE — [facility] Group Notes (Signed)
Adult Psychoeducational Group Note  Date:  11/16/2022 Time:  9:22 PM  Group Topic/Focus:  Wrap-Up Group:   The focus of this group is to help patients review their daily goal of treatment and discuss progress on daily workbooks.  Participation Level:  Active  Participation Quality:  Appropriate  Affect:  Appropriate  Cognitive:  Appropriate  Insight: Appropriate  Engagement in Group:  Engaged  Modes of Intervention:  Discussion  Additional Comments:  Pt attended group, stated day was a 5 out of 10. Pt stated goal was to stay positive today.  Joselyn Arrow 11/16/2022, 9:22 PM

## 2022-11-16 NOTE — Group Note (Signed)
LCSW Group Therapy Note   Group Date: 11/16/2022 Start Time: 1100 End Time: 1200  Type of Therapy and Topic:  Group Therapy: Are You Under Stress?!?!  Participation Level:  Active  Description of Group: This process group involved patients examining stress and how it impacts their lives. Distress and eustress definitions were explained and patients identified both good and bad stressors in their lives. Accompanying worksheet was used to help patients identify the physical and emotional symptoms of stress and techniques/copings skills they utilize to help reduce these symptoms.    Therapeutic Goals:  1.  Patients will talk about their experiences with distress and eustress. 2. Patients will identify stressors in their lives and the physical and emotional  symptoms they experience while under stress. 3. Patients to identify actions/coping skills that they utilize to help ameliorate  symptoms of stress.  Summary of Patient Progress:   Pt attended group   Therapeutic Modalities:  Cognitive Behavioral Therapy Solution-Focused Therapy  Izell Decatur LCSW 11/16/2022 2:44 PM

## 2022-11-16 NOTE — BHH Counselor (Signed)
Adult Comprehensive Assessment  Patient ID: Michael [name], male   DOB: 1976/01/16, 47 y.o.   MRN: [id number]  Information Source: Information source: Patient  Current Stressors:  Patient states their primary concerns and needs for treatment are:: "To get back to myself" Patient states their goals for this hospitilization and ongoing recovery are:: "too be happy" Educational / Learning stressors: "I dropped out the second time in completeing the 9th grde." Employment / Job issues: "I just lost my job" Family Relationships: "I have no familyEngineer, petroleum / Lack of resources (include bankruptcy): "I have no money" Housing / Lack of housing: "I live in my storage." Physical health (include injuries & life threatening diseases): denies Social relationships: reports none Substance abuse: denies Bereavement / Loss: "loss of independence and family"  Living/Environment/Situation:  Living Arrangements: Alone Who else lives in the home?: lives in storage unit for the past 1.5 years How long has patient lived in current situation?: 1.5 years What is atmosphere in current home: Dangerous  Family History:  Marital status: Single Are you sexually active?: No Does patient have children?: Yes How many children?: 1 How is patient's relationship with their children?: has a 25 yr old daugther.  reports good relationship  Childhood History:  By whom was/is the patient raised?: Both parents Description of patient's relationship with caregiver when they were a child: good Patient's description of current relationship with people who raised him/her: both deceased Does patient have siblings?: Yes Number of Siblings: 1 Description of patient's current relationship with siblings: he is deceased Did patient suffer any verbal/emotional/physical/sexual abuse as a child?: No Did patient suffer from severe childhood neglect?: No Has patient ever been sexually abused/assaulted/raped as an adolescent or  adult?: No Was the patient ever a victim of a crime or a disaster?: No Witnessed domestic violence?: No Has patient been affected by domestic violence as an adult?: No  Education:  Highest grade of school patient has completed: 8th Currently a student?: No Learning disability?: No  Employment/Work Situation:   Employment Situation: Unemployed Patient's Job has Been Impacted by Current Illness: No Has Patient ever Been in the [employer]?: No  Financial Resources:   Financial resources: No income Does patient have a Lawyer or guardian?: No  Alcohol/Substance Abuse:   What has been your use of drugs/alcohol within the last 12 months?: denies If attempted suicide, did drugs/alcohol play a role in this?: No Alcohol/Substance Abuse Treatment Hx: Denies past history Has alcohol/substance abuse ever caused legal problems?: No  Social Support System:   Conservation officer, nature Support System: Fair Museum/gallery exhibitions officer System: reports that he has providers in the community but no income to continue to receive support How does patient's faith help to cope with current illness?: "I don't"  Leisure/Recreation:   Do You Have Hobbies?: No  Strengths/Needs:   What is the patient's perception of their strengths?: "none" Patient states these barriers may affect their return to the community: "I am homeless with no money"  Discharge Plan:   Currently receiving community mental health services: Yes (From Whom) (Dr. Evelene Croon) Patient states concerns and preferences for aftercare planning are: no money to go to appts. Does patient have access to transportation?: No Does patient have financial barriers related to discharge medications?: Yes Patient description of barriers related to discharge medications: no income to get prescriptions Will patient be returning to same living situation after discharge?: Yes  Summary/Recommendations:   Summary and Recommendations (to be completed by  the evaluator): Michael [name] is a  47 yera old male that was admitted into [facility] on 11/15/22 after having suicidal thoughts.  He is exhausted from pretending that everything is fine and feels overwhelmed by life. He mentions losing his job and being homeless for about a year. He has a daughter who will turn 12 in three days, but he feels he has nothing to give her. With no family support--his parents have passed away and his older brother recently died--he has been isolating himself, which worries him as he feels increasingly detached from reality. He has been applying for jobs but hasn't had any success. His appetite and sleep have been poor, largely because he's been living in his storage unit. He denies having hallucinations or delusions but admits to suicidal ideation, considering "taking a bunch of pills." He recalls his last psychiatric hospitalization at age 10 due to severe panic attacks. While here, Michael [name] can benefit from crisis stabilization, medication management, therapeutic milieu, and referrals for services.   Marinda Elk. 11/16/2022
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
## 2022-11-16 NOTE — Plan of Care (Signed)
  Problem: Education: Goal: Knowledge of [facility] Education information/materials will improve Outcome: Progressing Goal: Emotional status will improve Outcome: Progressing Goal: Mental status will improve Outcome: Progressing Goal: Verbalization of understanding the information provided will improve Outcome: Progressing   Problem: Activity: Goal: Interest or engagement in activities will improve Outcome: Progressing Goal: Sleeping patterns will improve Outcome: Progressing   Problem: Coping: Goal: Ability to verbalize frustrations and anger appropriately will improve Outcome: Progressing Goal: Ability to demonstrate self-control will improve Outcome: Progressing   Problem: Health Behavior/Discharge Planning: Goal: Identification of resources available to assist in meeting health care needs will improve Outcome: Progressing Goal: Compliance with treatment plan for underlying cause of condition will improve Outcome: Progressing   Problem: Physical Regulation: Goal: Ability to maintain clinical measurements within normal limits will improve Outcome: Progressing   Problem: Safety: Goal: Periods of time without injury will increase Outcome: Progressing   Problem: Education: Goal: Utilization of techniques to improve thought processes will improve Outcome: Progressing Goal: Knowledge of the prescribed therapeutic regimen will improve Outcome: Progressing   Problem: Activity: Goal: Interest or engagement in leisure activities will improve Outcome: Progressing Goal: Imbalance in normal sleep/wake cycle will improve Outcome: Progressing   Problem: Coping: Goal: Coping ability will improve Outcome: Progressing Goal: Will verbalize feelings Outcome: Progressing   Problem: Health Behavior/Discharge Planning: Goal: Ability to make decisions will improve Outcome: Progressing Goal: Compliance with therapeutic regimen will improve Outcome: Progressing    Problem: Role Relationship: Goal: Will demonstrate positive changes in social behaviors and relationships Outcome: Progressing   Problem: Safety: Goal: Ability to disclose and discuss suicidal ideas will improve Outcome: Progressing Goal: Ability to identify and utilize support systems that promote safety will improve Outcome: Progressing   Problem: Self-Concept: Goal: Will verbalize positive feelings about self Outcome: Progressing Goal: Level of anxiety will decrease Outcome: Progressing   Problem: Education: Goal: Ability to make informed decisions regarding treatment will improve Outcome: Progressing   Problem: Coping: Goal: Coping ability will improve Outcome: Progressing   Problem: Health Behavior/Discharge Planning: Goal: Identification of resources available to assist in meeting health care needs will improve Outcome: Progressing   Problem: Medication: Goal: Compliance with prescribed medication regimen will improve Outcome: Progressing   Problem: Self-Concept: Goal: Ability to disclose and discuss suicidal ideas will improve Outcome: Progressing Goal: Will verbalize positive feelings about self Outcome: Progressing Note: Patient is on track. Patient will maintain adherence    Problem: Activity: Goal: Will identify at least one activity in which they can participate Outcome: Progressing   Problem: Coping: Goal: Ability to identify and develop effective coping behavior will improve Outcome: Progressing Goal: Ability to interact with others will improve Outcome: Progressing Goal: Demonstration of participation in decision-making regarding own care will improve Outcome: Progressing Goal: Ability to use eye contact when communicating with others will improve Outcome: Progressing   Problem: Health Behavior/Discharge Planning: Goal: Identification of resources available to assist in meeting health care needs will improve Outcome: Progressing   Problem:  Self-Concept: Goal: Will verbalize positive feelings about self Outcome: Progressing

## 2022-11-16 NOTE — BHH Group Notes (Signed)
The focus of this group is to help patients establish daily goals to achieve during treatment and discuss how the patient can incorporate goal setting into their daily lives to aide in recovery.    Scale 1 to 10:  4     Goal:  Stay positive

## 2022-11-16 NOTE — Group Note (Signed)
Recreation Therapy Group Note   Group Topic:Animal Assisted Therapy   Group Date: 11/16/2022 Start Time: 2956 End Time: 1035 Facilitators: Hennie Gosa-McCall, LRT,CTRS Location: [address] Dayroom   Animal-Assisted Activity (AAA) Program Checklist/Progress Notes Patient Eligibility Criteria Checklist & Daily Group note for Rec Tx Intervention  AAA/T Program Assumption of Risk Form signed by Patient/ or Parent Legal Guardian Yes  Patient understands his/her participation is voluntary Yes  Education: Charity fundraiser, Appropriate Animal Interaction   Education Outcome: Acknowledges education.    Affect/Mood: N/A   Participation Level: Did not attend    Clinical Observations/Individualized Feedback:     Plan: Continue to engage patient in RT group sessions 2-3x/week.   Michael [name], LRT,CTRS 11/16/2022 1:35 PM

## 2022-11-16 NOTE — Progress Notes (Signed)
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
## 2022-11-17 ENCOUNTER — Encounter (HOSPITAL_COMMUNITY): Payer: Self-pay

## 2022-11-17 MED ORDER — HYDROXYZINE HCL 25 MG PO TABS
25.0000 mg | ORAL_TABLET | Freq: Once | ORAL | Status: AC
  Administered 2022-11-17: 25 mg via ORAL
  Filled 2022-11-17 (×2): qty 1

## 2022-11-17 NOTE — Group Note (Signed)
Date:  11/17/2022 Time:  8:59 AM  Group Topic/Focus:  Goals Group:   The focus of this group is to help patients establish daily goals to achieve during treatment and discuss how the patient can incorporate goal setting into their daily lives to aide in recovery.    Participation Level:  Did Not Attend  Participation Quality:   n/a  Affect:   n/a  Cognitive:   n/a  Insight: None  Engagement in Group:   n/a  Modes of Intervention:   n/a  Additional Comments:  n/a  Kyrstin Campillo D Jaquell Seddon 11/17/2022, 8:59 AM

## 2022-11-17 NOTE — Progress Notes (Signed)
Report received from [facility]. Patient reporting difficulty with sleep. Patient reports intolerance to melatonin and trazodone. Provider notified, 25 mg hydroxyzine ordered, once for sleep. Medication given. Patient reports no symptoms of withdrawal or anxiety at this time.

## 2022-11-17 NOTE — Progress Notes (Signed)
   11/17/22 2245  Psych Admission Type (Psych Patients Only)  Admission Status Voluntary  Psychosocial Assessment  Patient Complaints Anxiety  Eye Contact Fair  Facial Expression Anxious;Animated  Affect Depressed  Speech Logical/coherent  Interaction Assertive  Motor Activity Restless  Appearance/Hygiene Unremarkable  Behavior Characteristics Anxious  Mood Anxious  Aggressive Behavior  Effect No apparent injury  Thought Process  Coherency WDL  Content WDL  Delusions WDL  Perception WDL  Hallucination None reported or observed  Judgment Impaired  Confusion WDL  Danger to Self  Current suicidal ideation? Denies  Danger to Others  Danger to Others None reported or observed

## 2022-11-17 NOTE — Plan of Care (Signed)
  Problem: Education: Goal: Knowledge of [facility] Education information/materials will improve Outcome: Progressing Goal: Emotional status will improve Outcome: Progressing Goal: Mental status will improve Outcome: Progressing Goal: Verbalization of understanding the information provided will improve Outcome: Progressing   Problem: Activity: Goal: Interest or engagement in activities will improve Outcome: Progressing Goal: Sleeping patterns will improve Outcome: Progressing   Problem: Coping: Goal: Ability to verbalize frustrations and anger appropriately will improve Outcome: Progressing Goal: Ability to demonstrate self-control will improve Outcome: Progressing   Problem: Health Behavior/Discharge Planning: Goal: Identification of resources available to assist in meeting health care needs will improve Outcome: Progressing Goal: Compliance with treatment plan for underlying cause of condition will improve Outcome: Progressing   Problem: Physical Regulation: Goal: Ability to maintain clinical measurements within normal limits will improve Outcome: Progressing   Problem: Safety: Goal: Periods of time without injury will increase Outcome: Progressing   Problem: Education: Goal: Utilization of techniques to improve thought processes will improve Outcome: Progressing Goal: Knowledge of the prescribed therapeutic regimen will improve Outcome: Progressing   Problem: Activity: Goal: Interest or engagement in leisure activities will improve Outcome: Progressing Goal: Imbalance in normal sleep/wake cycle will improve Outcome: Progressing   Problem: Coping: Goal: Coping ability will improve Outcome: Progressing Goal: Will verbalize feelings Outcome: Progressing   Problem: Health Behavior/Discharge Planning: Goal: Ability to make decisions will improve Outcome: Progressing Goal: Compliance with therapeutic regimen will improve Outcome: Progressing    Problem: Role Relationship: Goal: Will demonstrate positive changes in social behaviors and relationships Outcome: Progressing   Problem: Safety: Goal: Ability to disclose and discuss suicidal ideas will improve Outcome: Progressing Goal: Ability to identify and utilize support systems that promote safety will improve Outcome: Progressing   Problem: Self-Concept: Goal: Will verbalize positive feelings about self Outcome: Progressing Goal: Level of anxiety will decrease Outcome: Progressing   Problem: Education: Goal: Ability to make informed decisions regarding treatment will improve Outcome: Progressing   Problem: Coping: Goal: Coping ability will improve Outcome: Progressing   Problem: Health Behavior/Discharge Planning: Goal: Identification of resources available to assist in meeting health care needs will improve Outcome: Progressing   Problem: Medication: Goal: Compliance with prescribed medication regimen will improve Outcome: Progressing   Problem: Self-Concept: Goal: Ability to disclose and discuss suicidal ideas will improve Outcome: Progressing Goal: Will verbalize positive feelings about self Outcome: Progressing Note: Patient is on track. Patient will maintain adherence    Problem: Activity: Goal: Will identify at least one activity in which they can participate Outcome: Progressing   Problem: Coping: Goal: Ability to identify and develop effective coping behavior will improve Outcome: Progressing Goal: Ability to interact with others will improve Outcome: Progressing Goal: Demonstration of participation in decision-making regarding own care will improve Outcome: Progressing Goal: Ability to use eye contact when communicating with others will improve Outcome: Progressing   Problem: Health Behavior/Discharge Planning: Goal: Identification of resources available to assist in meeting health care needs will improve Outcome: Progressing   Problem:  Self-Concept: Goal: Will verbalize positive feelings about self Outcome: Progressing

## 2022-11-17 NOTE — BH IP Treatment Plan (Signed)
Interdisciplinary Treatment and Diagnostic Plan Intial  11/17/2022 Time of Session: 1125 Michael [name] MRN: [id number]  Principal Diagnosis: Major depressive disorder, recurrent severe without psychotic features (HCC)  Secondary Diagnoses: Principal Problem:   Major depressive disorder, recurrent severe without psychotic features (HCC) Active Problems:   Homelessness   Panic disorder   Current Medications:  Current Facility-Administered Medications  Medication Dose Route Frequency Provider Last Rate Last Admin   alum & mag hydroxide-simeth (MAALOX/MYLANTA) 200-200-20 MG/5ML suspension 30 mL  30 mL Oral Q4H PRN Dahlia Byes C, NP       busPIRone (BUSPAR) tablet 10 mg  10 mg Oral BID Cecilie Lowers, FNP   10 mg at 11/17/22 4098   diphenhydrAMINE (BENADRYL) capsule 50 mg  50 mg Oral TID PRN Earney Navy, NP       Or   diphenhydrAMINE (BENADRYL) injection 50 mg  50 mg Intramuscular TID PRN Dahlia Byes C, NP       haloperidol (HALDOL) tablet 5 mg  5 mg Oral TID PRN Dahlia Byes C, NP       Or   haloperidol lactate (HALDOL) injection 5 mg  5 mg Intramuscular TID PRN Earney Navy, NP       hydrOXYzine (ATARAX) tablet 25 mg  25 mg Oral Q6H PRN Roselle Locus, MD   25 mg at 11/16/22 2058   loperamide (IMODIUM) capsule 2-4 mg  2-4 mg Oral PRN Roselle Locus, MD       LORazepam (ATIVAN) tablet 1 mg  1 mg Oral Q6H PRN Hill, Shelbie Hutching, MD       LORazepam (ATIVAN) tablet 1 mg  1 mg Oral TID Roselle Locus, MD   1 mg at 11/17/22 1191   Followed by   Melene Muller ON 11/18/2022] LORazepam (ATIVAN) tablet 1 mg  1 mg Oral BID Hill, Shelbie Hutching, MD       Followed by   Melene Muller ON 11/19/2022] LORazepam (ATIVAN) tablet 1 mg  1 mg Oral Daily Hill, Shelbie Hutching, MD       multivitamin with minerals tablet 1 tablet  1 tablet Oral Daily Hill, Shelbie Hutching, MD   1 tablet at 11/17/22 4782   nicotine (NICODERM CQ - dosed in mg/24 hours) patch 21 mg  21  mg Transdermal Daily Abbott Pao, Nadir, MD   21 mg at 11/16/22 1418   nicotine polacrilex (NICORETTE) gum 2 mg  2 mg Oral PRN Phineas Inches, MD   2 mg at 11/16/22 0936   ondansetron (ZOFRAN-ODT) disintegrating tablet 4 mg  4 mg Oral Q6H PRN Roselle Locus, MD       venlafaxine XR (EFFEXOR-XR) 24 hr capsule 75 mg  75 mg Oral Q breakfast Ntuen, Jesusita Oka, FNP   75 mg at 11/17/22 9562   PTA Medications: Medications Prior to Admission  Medication Sig Dispense Refill Last Dose   acetaminophen (TYLENOL) 500 MG tablet Take 500-1,000 mg by mouth every 6 (six) hours as needed for mild pain or headache.      ALPRAZolam (XANAX) 1 MG tablet Take 1-2 mg by mouth 3 (three) times daily as needed for anxiety.      amoxicillin-clavulanate (AUGMENTIN) 875-125 MG per tablet Take 1 tablet by mouth every 12 (twelve) hours. (Patient not taking: Reported on 11/14/2022) 14 tablet 0    amphetamine-dextroamphetamine (ADDERALL) 20 MG tablet Take 20 mg by mouth 3 (three) times daily.      aspirin 81 MG chewable tablet Chew 1 tablet (81  mg total) by mouth daily. (Patient not taking: Reported on 11/14/2022) 30 tablet 0    DESCOVY 200-25 MG tablet Take 1 tablet by mouth daily.      desvenlafaxine (PRISTIQ) 100 MG 24 hr tablet Take 100 mg by mouth every morning.      doxycycline (ADOXA) 100 MG tablet Take 200 mg by mouth See admin instructions. "Take 200 mg (2 tablets) by mouth up to 72 hours after intercourse. Do not exceed more than 200 mg (two tablets) in 24 hrs."      oxyCODONE (OXY IR/ROXICODONE) 5 MG immediate release tablet Take 0.5-1 tablets (2.5-5 mg total) by mouth every 6 (six) hours as needed for severe pain. (Patient not taking: Reported on 11/14/2022) 20 tablet 0    propranolol (INDERAL) 20 MG tablet Take 20 mg by mouth 3 (three) times daily as needed (for migraines).      VISINE DRY EYE RELIEF 1 % SOLN Apply 1 drop to eye 2 (two) times daily as needed (for irritation).       Patient Stressors:    Patient  Strengths:    Treatment Modalities: Medication Management, Group therapy, Case management,  1 to 1 session with clinician, Psychoeducation, Recreational therapy.   Physician Treatment Plan for Primary Diagnosis: Major depressive disorder, recurrent severe without psychotic features (HCC) Long Term Goal(s): Improvement in symptoms so as ready for discharge   Short Term Goals: Ability to identify changes in lifestyle to reduce recurrence of condition will improve Ability to verbalize feelings will improve Ability to disclose and discuss suicidal ideas Ability to demonstrate self-control will improve Ability to identify and develop effective coping behaviors will improve  Medication Management: Evaluate patient's response, side effects, and tolerance of medication regimen.  Therapeutic Interventions: 1 to 1 sessions, Unit Group sessions and Medication administration.  Evaluation of Outcomes: Progressing  Physician Treatment Plan for Secondary Diagnosis: Principal Problem:   Major depressive disorder, recurrent severe without psychotic features (HCC) Active Problems:   Homelessness   Panic disorder  Long Term Goal(s): Improvement in symptoms so as ready for discharge   Short Term Goals: Ability to identify changes in lifestyle to reduce recurrence of condition will improve Ability to verbalize feelings will improve Ability to disclose and discuss suicidal ideas Ability to demonstrate self-control will improve Ability to identify and develop effective coping behaviors will improve     Medication Management: Evaluate patient's response, side effects, and tolerance of medication regimen.  Therapeutic Interventions: 1 to 1 sessions, Unit Group sessions and Medication administration.  Evaluation of Outcomes: Progressing   RN Treatment Plan for Primary Diagnosis: Major depressive disorder, recurrent severe without psychotic features (HCC) Long Term Goal(s): Knowledge of disease and  therapeutic regimen to maintain health will improve  Short Term Goals: Ability to remain free from injury will improve, Ability to verbalize frustration and anger appropriately will improve, Ability to demonstrate self-control, Ability to participate in decision making will improve, Ability to verbalize feelings will improve, Ability to disclose and discuss suicidal ideas, Ability to identify and develop effective coping behaviors will improve, and Compliance with prescribed medications will improve  Medication Management: RN will administer medications as ordered by provider, will assess and evaluate patient's response and provide education to patient for prescribed medication. RN will report any adverse and/or side effects to prescribing provider.  Therapeutic Interventions: 1 on 1 counseling sessions, Psychoeducation, Medication administration, Evaluate responses to treatment, Monitor vital signs and CBGs as ordered, Perform/monitor CIWA, COWS, AIMS and Fall Risk screenings as ordered,  Perform wound care treatments as ordered.  Evaluation of Outcomes: Progressing   LCSW Treatment Plan for Primary Diagnosis: Major depressive disorder, recurrent severe without psychotic features (HCC) Long Term Goal(s): Safe transition to appropriate next level of care at discharge, Engage patient in therapeutic group addressing interpersonal concerns.  Short Term Goals: Engage patient in aftercare planning with referrals and resources, Increase social support, Increase ability to appropriately verbalize feelings, Increase emotional regulation, Facilitate acceptance of mental health diagnosis and concerns, Facilitate patient progression through stages of change regarding substance use diagnoses and concerns, Identify triggers associated with mental health/substance abuse issues, and Increase skills for wellness and recovery  Therapeutic Interventions: Assess for all discharge needs, 1 to 1 time with Social worker,  Explore available resources and support systems, Assess for adequacy in community support network, Educate family and significant other(s) on suicide prevention, Complete Psychosocial Assessment, Interpersonal group therapy.  Evaluation of Outcomes: Progressing   Progress in Treatment: Attending groups: Yes. Participating in groups: Yes. Taking medication as prescribed: Yes. Toleration medication: Yes. Family/Significant other contact made: No, will contact:  Consents Patient understands diagnosis: Yes. Discussing patient identified problems/goals with staff: Yes. Medical problems stabilized or resolved: Yes. Denies suicidal/homicidal ideation: Yes. Issues/concerns per patient self-inventory: Yes. Other: N/A  New problem(s) identified: No, Describe:  None Reported  New Short Term/Long Term Goal(s): medication stabilization, elimination of SI thoughts, development of comprehensive mental wellness plan.   Patient Goals:  Medication Stabilization  Discharge Plan or Barriers: Patient recently admitted will continue to monitor  Reason for Continuation of Hospitalization: Anxiety Depression Medication stabilization Suicidal ideation Withdrawal symptoms  Estimated Length of Stay: 5-7 Days  Last 3 [place] Suicide Severity Risk Score: Flowsheet Row Ad[facility] (Current) [facility] from [facility]14/2022 in [facility] ED from 01/03/2022 in [facility] Emergency Department at [facility]  C-SSRS RISK CATEGORY No Risk Low Risk No Risk       Last PHQ 2/9 Scores:     No data to display         detox, medication management for mood stabilization; elimination of SI thoughts; development of comprehensive mental wellness/sobriety plan   Scribe for Treatment Team: Ane Payment, LCSW 11/17/2022 11:22 AM

## 2022-11-17 NOTE — Progress Notes (Signed)
[facility] MD Progress Note  11/17/2022 1:03 PM Michael [name]  MRN:  [id number]  Principal Problem: Major depressive disorder, recurrent severe without psychotic features (HCC) Diagnosis: Principal Problem:   Major depressive disorder, recurrent severe without psychotic features (HCC) Active Problems:   Homelessness   Panic disorder  Reason for admission:  Michael [name], 47 y.o. Caucasian male with prior psychiatric history significant for MDD, homelessness, GAD and panic attacks, who [name] for worsening chronic depression with suicidal ideation without specific plan in the context of homelessness and frequent panic attacks.  After medical evaluation/stabilization & clearance, he was transferred to the [facility] for further psychiatric evaluation & treatments.   Yesterday the psychiatry team made the following recommendations:  Continue Effexor XR 75 mg mg p.o. daily for depression Continue BuSpar 10 mg p.o. 2 times daily for anxiety Continue nicotine dose in mg per 24 hours 21 mg patch transdermal over 24 hours daily for smoking cessation Continue nicotine 2 mg gum p.o. as needed for smoking cessation  Today's assessment nodes: On assessment today, the pt reports that his mood is less depressed, however, present with depressed mood and flat affect.  Dow is alert, calm, oriented to person, place, time, and situation.  Able to answer assessment questions appropriately.  However report that he feels disconnected from reality.  Further report not sleeping appropriately last night.  Patient reports sensitivity to melatonin, trazodone, and requesting Xanax for sleep.  Make patient aware that we are trying to wean him off from his chronic Xanax use of 25 years.  No signs and symptoms of withdrawal symptoms observed at this time.  CIWA score of 0 today.  Denies acute discomfort. Reports that anxiety is at manageable [name] Nursing staff  report patient sleeping over 4 hours last night.  Appetite is good Concentration is fair [employer] [name] is adequate Denies suicidal thoughts.  Denies suicidal intent or plan.  Denies having any HI.  Denies having psychotic symptoms.   Denies having side effects to current psychiatric medications.   We discussed compliance to current medication regimen  Discussed the following psychosocial stressors: Attending therapeutic milieu and unit group activities.  Further to report any signs and symptoms of withdrawal from chronic benzodiazepine use.  Total Time spent with patient: 45 minutes  Past Psychiatric History: Previous Psych Diagnoses: Chronic MDD, GAD, panic attack Prior inpatient treatment: Yes at age 15 years old, due to severe panic attack Current/prior outpatient treatment: Seen by a psychiatrist at [facility] Prior rehab hx: Denies Psychotherapy hx: As History of suicide: Yes, history of drug overdose in 2023 History of homicide or aggression: Denies Psychiatric medication history: Yes, patient has been on a trial of trazodone, Pristiq, Zyprexa, Adderall, BuSpar, and Xanax. Psychiatric medication compliance history: Noncompliance due to affordability Neuromodulation history: Denies Current Psychiatrist yes at [facility] Current therapist: Denies  Past Medical History:  Past Medical History:  Diagnosis Date   Anxiety    TGA (transient global amnesia)    TIA (transient ischemic attack)     History reviewed. No pertinent surgical history.  Family History: History reviewed. No pertinent family history.  Family Psychiatric  History: See H&P  Social History:  Social History   Substance and Sexual Activity  Alcohol Use Not Currently   Comment: rarely     Social History   Substance and Sexual Activity  Drug Use No    Social History   Socioeconomic  History   Marital status: Single    Spouse name: Not on file   Number of children: Not  on file   Years of education: Not on file   Highest education [name]: Not on file  Occupational History   Not on file  Tobacco Use   Smoking status: Some Days    Current packs/day: 0.50    Average packs/day: 0.5 packs/day for 20.0 years (10.0 ttl pk-yrs)    Types: Cigarettes   Smokeless tobacco: Not on file  Vaping Use   Vaping status: Every Day   Substances: Nicotine, Flavoring  Substance and Sexual Activity   Alcohol use: Not Currently    Comment: rarely   Drug use: No   Sexual activity: Yes    Birth control/protection: None  Other Topics Concern   Not on file  Social History Narrative   Not on file   Social Determinants of Health   Financial Resource Strain: Not on file  Food Insecurity: Food Insecurity Present (11/15/2022)   Hunger Vital Sign    Worried About Running Out of Food in the Last Year: Often true    Ran Out of Food in the Last Year: Often true  Transportation Needs: Unmet Transportation Needs (11/15/2022)   PRAPARE - Administrator, Civil Service (Medical): Yes    Lack of Transportation (Non-Medical): Yes  Physical Activity: Not on file  Stress: Not on file  Social Connections: Unknown (09/01/2022)   Received from [employer]   Social Network    Social Network: Not on file   Additional Social History:    Sleep: Good  Appetite:  Good  Current Medications: Current Facility-Administered Medications  Medication Dose Route Frequency Provider Last Rate Last Admin   alum & mag hydroxide-simeth (MAALOX/MYLANTA) 200-200-20 MG/5ML suspension 30 mL  30 mL Oral Q4H PRN Michael Byes [name], Michael [name]       busPIRone (BUSPAR) tablet 10 mg  10 mg Oral BID Michael Lowers, Michael [name]   10 mg at 11/17/22 1610   diphenhydrAMINE (BENADRYL) capsule 50 mg  50 mg Oral TID PRN Michael Navy, Michael [name]       Or   diphenhydrAMINE (BENADRYL) injection 50 mg  50 mg Intramuscular TID PRN Michael [name], Michael [name], Michael [name]       haloperidol (HALDOL) tablet 5 mg  5 mg Oral TID PRN Michael Byes [name], Michael [name]       Or   haloperidol lactate (HALDOL) injection 5 mg  5 mg Intramuscular TID PRN Michael Byes [name], Michael [name]       hydrOXYzine (ATARAX) tablet 25 mg  25 mg Oral Q6H PRN Roselle Locus, MD   25 mg at 11/16/22 2058   loperamide (IMODIUM) capsule 2-4 mg  2-4 mg Oral PRN Roselle Locus, MD       LORazepam (ATIVAN) tablet 1 mg  1 mg Oral Q6H PRN Hill, Shelbie Hutching, MD       LORazepam (ATIVAN) tablet 1 mg  1 mg Oral TID Roselle Locus, MD   1 mg at 11/17/22 1252   Followed by   Melene Muller ON 11/18/2022] LORazepam (ATIVAN) tablet 1 mg  1 mg Oral BID Hill, Shelbie Hutching, MD       Followed by   Melene Muller ON 11/19/2022] LORazepam (ATIVAN) tablet 1 mg  1 mg Oral Daily Hill, Shelbie Hutching, MD       multivitamin with minerals tablet 1 tablet  1 tablet Oral Daily Hill, Shelbie Hutching, MD   1  tablet at 11/17/22 8119   nicotine (NICODERM CQ - dosed in mg/24 hours) patch 21 mg  21 mg Transdermal Daily Attiah, Nadir, MD   21 mg at 11/16/22 1418   nicotine polacrilex (NICORETTE) gum 2 mg  2 mg Oral PRN Massengill, Harrold Donath, MD   2 mg at 11/16/22 0936   ondansetron (ZOFRAN-ODT) disintegrating tablet 4 mg  4 mg Oral Q6H PRN Roselle Locus, MD       venlafaxine XR (EFFEXOR-XR) 24 hr capsule 75 mg  75 mg Oral Q breakfast Ebbie Sorenson [name], Michael [name]   75 mg at 11/17/22 1478    Lab Results: No results found for this or any previous visit (from the past 48 hour(s)).  Blood Alcohol [name]:  Lab Results  Component Value Date   ETH <10 11/14/2022   ETH <10 11/20/2021    Metabolic Disorder Labs: No results found for: "HGBA1C", "MPG" No results found for: "PROLACTIN" No results found for: "CHOL", "TRIG", "HDL", "CHOLHDL", "VLDL", "LDLCALC"  Physical Findings: AIMS:  , ,  ,  ,    CIWA:  CIWA-Ar Total: 0 COWS:     Musculoskeletal: Strength & Muscle Tone: within normal limits Gait & Station: normal Patient leans: N/A  Psychiatric Specialty Exam:  Presentation  General  Appearance:  Appropriate for Environment; Casual  Eye Contact: Good  Speech: Clear and Coherent  Speech Volume: Normal  Handedness: Right   Mood and Affect  Mood: Anxious; Depressed  Affect: Congruent   Thought Process  Thought Processes: Coherent  Descriptions of Associations:Intact  Orientation:Full (Time, Place and Person)  Thought Content:WDL  History of Schizophrenia/Schizoaffective disorder:No data recorded Duration of Psychotic Symptoms:No data recorded Hallucinations:Hallucinations: None  Ideas of Reference:None  Suicidal Thoughts:Suicidal Thoughts: No SI Active Intent and/or Plan: -- (Denies)  Homicidal Thoughts:Homicidal Thoughts: No  Sensorium  Memory: Immediate Good; Recent Good  Judgment: Poor  Insight: Shallow  Executive Functions  Concentration: Fair  Attention Span: Fair  Recall: [employer] of Knowledge: Fair  Language: Fair  Psychomotor Activity  Psychomotor Activity: Psychomotor Activity: Normal  Assets  Assets: Communication Skills; Physical Health; Resilience  Sleep  Sleep: Sleep: Fair Number of Hours of Sleep: 4  Physical Exam: Physical Exam Vitals and nursing note reviewed.  HENT:     Head: Normocephalic.     Nose: Nose normal.     Mouth/Throat:     Mouth: Mucous membranes are moist.  Eyes:     Extraocular Movements: Extraocular movements intact.  Cardiovascular:     Rate and Rhythm: Normal rate.     Pulses: Normal pulses.  Pulmonary:     Effort: Pulmonary effort is normal.  Abdominal:     Comments: Deferred  Genitourinary:    Comments: Deferred Musculoskeletal:        General: Normal range of motion.     Cervical back: Normal range of motion.  Skin:    General: Skin is warm.  Neurological:     General: No focal deficit present.     Mental Status: He is alert and oriented to person, place, and time.  Psychiatric:        Mood and Affect: Mood normal.        Behavior: Behavior normal.         Thought Content: Thought content normal.    Review of Systems  Constitutional:  Negative for chills and fever.  HENT:  Negative for sore throat.   Eyes:  Negative for blurred vision.  Respiratory:  Negative for cough,  shortness of breath and wheezing.   Cardiovascular:  Negative for chest pain and palpitations.  Gastrointestinal:  Negative for abdominal pain, constipation, diarrhea, heartburn, nausea and vomiting.  Genitourinary:  Negative for dysuria, frequency and urgency.  Musculoskeletal:  Negative for myalgias and neck pain.  Skin:  Negative for itching and rash.  Neurological:  Negative for dizziness, tingling, tremors, sensory change, speech change and headaches.  Endo/Heme/Allergies:        See allergy listing  Psychiatric/Behavioral:  Positive for depression. The patient is nervous/anxious and has insomnia.    Blood pressure 131/85, pulse 90, temperature 99.1 F (37.3 [name]), temperature source Oral, resp. rate 18, height 5\' 11"  (1.803 m), weight 71.7 kg, SpO2 98%. Body mass index is 22.04 kg/m.   Treatment Plan Summary: Daily contact with patient to assess and evaluate symptoms and progress in treatment and Medication management Physician Treatment Plan for Primary Diagnosis: Assessment: Principal Problem:   Major depressive disorder, recurrent severe without psychotic features (HCC) Active Problems:   Homelessness   Panic disorder   Plan: Medications: Continue Effexor XR 75 mg mg p.o. daily for depression Continue BuSpar 10 mg p.o. 2 times daily for anxiety Continue nicotine dose in mg per 24 hours 21 mg patch transdermal over 24 hours daily for smoking cessation Continue nicotine 2 mg gum p.o. as needed for smoking cessation   Ativan detox protocol: See MAR   Agitation protocol: See MAR   Other PRN Medications -Acetaminophen 650 mg every 6 as needed/mild pain -Maalox 30 mL oral every 4 as needed/digestion -Magnesium hydroxide 30 mL daily as needed/mild  constipation   Abnormal Labs reviewed: CMP: Glucose 117 high, calcium 8.8 low, AST 12 low, bilirubin 1.4 high.  UDS: Positive for benzodiazepines.   EKG reviewed: NSR, ventricular rate 60, QT/QTc 424/424   Safety and Monitoring: Voluntary admission to inpatient psychiatric unit for safety, stabilization and treatment Daily contact with patient to assess and evaluate symptoms and progress in treatment Patient's case to be discussed in multi-disciplinary team meeting Observation [name] : q15 minute checks Vital signs: q12 hours Precautions: suicide, but pt currently verbally contracts for safety on unit    Discharge Planning: Social work and case management to assist with discharge planning and identification of [name] follow-up needs prior to discharge Estimated LOS: 5-7 days Discharge Concerns: Need to establish a safety plan; Medication compliance and effectiveness Discharge Goals: Return home with outpatient referrals for mental health follow-up including medication management/psychotherapy. Long Term Goal(s): Improvement in symptoms so as ready for discharge   Short Term Goals: Ability to identify changes in lifestyle to reduce recurrence of condition will improve, Ability to verbalize feelings will improve, Ability to disclose and discuss suicidal ideas, Ability to demonstrate self-control will improve, and Ability to identify and develop effective coping behaviors will improve   Physician Treatment Plan for Secondary Diagnosis: Principal Problem:   Major depressive disorder, recurrent severe without psychotic features (HCC) Active Problems:   Homelessness   Panic disorder     I certify that inpatient services furnished can reasonably be expected to improve the patient's condition.   Michael Lowers, Michael [name] 11/17/2022, 1:03 PM

## 2022-11-17 NOTE — [facility] Group Notes (Signed)
[facility] Group Notes:  (Nursing/MHT/Case Management/Adjunct)  Date:  11/17/2022  Time:  2000  Type of Therapy:   Narcotics Anonymous Meeting  Participation Level:  None  Participation Quality:  Resistant  Affect:  Depressed and Flat  Cognitive:  Alert  Insight:  Improving  Engagement in Group:  Resistant  Modes of Intervention:  Clarification, Education, and Support  Summary of Progress/Problems: Pt left meeting shortly after it began.  Johann Capers S 11/17/2022, 10:07 PM

## 2022-11-17 NOTE — [facility] Group Notes (Signed)
Spiritual care group on grief and loss facilitated by Chaplain Dyanne Carrel, Bcc and Arlyce Dice, Mdiv  Group Goal: Support / Education around grief and loss  Members engage in facilitated group support and psycho-social education.  Group Description:  Following introductions and group rules, group members engaged in facilitated group dialogue and support around topic of loss, with particular support around experiences of loss in their lives. Group Identified types of loss (relationships / self / things) and identified patterns, circumstances, and changes that precipitate losses. Reflected on thoughts / feelings around loss, normalized grief responses, and recognized variety in grief experience. Group encouraged individual reflection on safe space and on the coping skills that they are already utilizing.  Group drew on Adlerian / Rogerian and narrative framework  Patient Progress: Michael [name] came in towards the end of group.  He demonstrated engagement for the time he was present in group.

## 2022-11-17 NOTE — Group Note (Signed)
Recreation Therapy Group Note   Group Topic:Team Building  Group Date: 11/17/2022 Start Time: 1011 End Time: 1035 Facilitators: Charleton Deyoung-McCall, LRT,CTRS Location: [address] Dayroom   Goal Area(s) Addresses:  Patient will effectively work with peer towards shared goal.  Patient will identify skills used to make activity successful.  Patient will identify how skills used during activity can be used to reach post d/c goals.   Intervention: STEM Activity  Group Description: Straw Bridge. In teams of 3-5, patients were given 15 plastic drinking straws and an equal length of masking tape. Using the materials provided, patients were instructed to build a free standing bridge-like structure to suspend an everyday item (ex: puzzle box) off of the floor or table surface. All materials were required to be used by the team in their design. LRT facilitated post-activity discussion reviewing team process. Patients were encouraged to reflect how the skills used in this activity can be generalized to daily life post discharge.   Education: Pharmacist, community, Scientist, physiological, Discharge Planning   Education Outcome: Acknowledges education/In group clarification offered/Needs additional education.    Affect/Mood: Appropriate   Participation Level: Engaged   Participation Quality: Independent   Behavior: Appropriate   Speech/Thought Process: Focused   Insight: Good   Judgement: Good   Modes of Intervention: STEM Activity   Patient Response to Interventions:  Engaged   Education Outcome:  In group clarification offered    Clinical Observations/Individualized Feedback: Pt came in a little late to group. Pt was able to work with peer in constructing bridge. Pt was called out of group and didn't return.    Plan: Continue to engage patient in RT group sessions 2-3x/week.   Jere Bostrom-McCall, LRT,CTRS 11/17/2022 1:14 PM

## 2022-11-17 NOTE — Plan of Care (Signed)
  Problem: Education: Goal: Emotional status will improve Outcome: Progressing Goal: Mental status will improve Outcome: Progressing   Problem: Activity: Goal: Interest or engagement in activities will improve Outcome: Progressing   Problem: Self-Concept: Goal: Level of anxiety will decrease Outcome: Progressing   Problem: Coping: Goal: Coping ability will improve Outcome: Progressing

## 2022-11-18 MED ORDER — IBUPROFEN 600 MG PO TABS
600.0000 mg | ORAL_TABLET | Freq: Four times a day (QID) | ORAL | Status: DC | PRN
  Administered 2022-11-22 – 2022-11-24 (×2): 600 mg via ORAL
  Filled 2022-11-18 (×2): qty 1

## 2022-11-18 NOTE — Progress Notes (Signed)
   11/18/22 1100  Psych Admission Type (Psych Patients Only)  Admission Status Voluntary  Psychosocial Assessment  Patient Complaints None  Eye Contact Fair  Facial Expression Animated;Anxious  Affect Depressed  Speech Logical/coherent  Interaction Assertive  Motor Activity Restless  Appearance/Hygiene Unremarkable  Behavior Characteristics Cooperative;Anxious  Mood Depressed;Anxious  Thought Process  Coherency WDL  Content WDL  Delusions None reported or observed  Perception WDL  Hallucination None reported or observed  Judgment Impaired  Confusion None  Danger to Self  Current suicidal ideation? Denies  Danger to Others  Danger to Others None reported or observed

## 2022-11-18 NOTE — Group Note (Signed)
[facility] LCSW Group Therapy Note   Group Date: 11/18/2022 Start Time: 1105 End Time: 1155  Type of Therapy/Topic:  Group Therapy:  Feelings about Diagnosis  Participation Level:  Active   Mood: Appropriate   Description of Group:    This group will allow patients to explore their thoughts and feelings about diagnoses they have received. Patients will be guided to explore their level of understanding and acceptance of these diagnoses. Facilitator will encourage patients to process their thoughts and feelings about the reactions of others to their diagnosis, and will guide patients in identifying ways to discuss their diagnosis with significant others in their lives. This group will be process-oriented, with patients participating in exploration of their own experiences as well as giving and receiving support and challenge from other group members.   Therapeutic Goals: 1. Patient will demonstrate understanding of diagnosis as evidence by identifying two or more symptoms of the disorder:  2. Patient will be able to express two feelings regarding the diagnosis 3. Patient will demonstrate ability to communicate their needs through discussion and/or role plays  Summary of Patient Progress: [name] provided insightful input.    Therapeutic Modalities:   Cognitive Behavioral Therapy Brief Therapy Feelings Identification    Harlan Vinal S Shaunice Levitan, LCSW
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
## 2022-11-18 NOTE — Group Note (Signed)
Date:  11/18/2022 Time:  11:36 AM  Group Topic/Focus:  Goals Group:   The focus of this group is to help patients establish daily goals to achieve during treatment and discuss how the patient can incorporate goal setting into their daily lives to aide in recovery.    Participation Level:  Active  Participation Quality:  Appropriate  Affect:  Appropriate  Cognitive:  Appropriate  Insight: Appropriate  Engagement in Group:  Engaged  Modes of Intervention:  Discussion  Additional Comments:     Reymundo Poll 11/18/2022, 11:36 AM

## 2022-11-18 NOTE — [facility] Group Notes (Signed)
[facility] Group Notes:  (Nursing/MHT/Case Management/Adjunct)  Date:  11/18/2022  Time:  8:33 PM  Type of Therapy:  Wrap Up Group  Participation Level:  Active  Participation Quality:  Attentive  Affect:  Appropriate  Cognitive:  Appropriate  Insight:  Good  Engagement in Group:  Improving  Modes of Intervention:  Discussion  Summary of Progress/Problems: Pt rated his day to be a 5/10  Michael [name] [name] 11/18/2022, 8:33 PM

## 2022-11-19 ENCOUNTER — Inpatient Hospital Stay (HOSPITAL_COMMUNITY): Payer: BLUE CROSS/BLUE SHIELD

## 2022-11-19 ENCOUNTER — Other Ambulatory Visit: Payer: Self-pay

## 2022-11-19 ENCOUNTER — Inpatient Hospital Stay (HOSPITAL_COMMUNITY): Payer: Medicaid Other

## 2022-11-19 DIAGNOSIS — F332 Major depressive disorder, recurrent severe without psychotic features: Secondary | ICD-10-CM

## 2022-11-19 LAB — BASIC METABOLIC PANEL
Anion gap: 13 (ref 5–15)
BUN: 9 mg/dL (ref 6–20)
CO2: 24 mmol/L (ref 22–32)
Calcium: 8.9 mg/dL (ref 8.9–10.3)
Chloride: 98 mmol/L (ref 98–111)
Creatinine, Ser: 0.75 mg/dL (ref 0.61–1.24)
GFR, Estimated: >60 mL/min (ref >60)
Glucose, Bld: 96 mg/dL (ref 70–99)
Potassium: 3.8 mmol/L (ref 3.5–5.1)
Sodium: 135 mmol/L (ref 135–145)

## 2022-11-19 LAB — CBG MONITORING, ED: Glucose-Capillary: 98 mg/dL (ref 70–99)

## 2022-11-19 LAB — CBC
HCT: 43.3 % (ref 39.0–52.0)
Hemoglobin: 14.3 g/dL (ref 13.0–17.0)
MCH: 31.4 pg (ref 26.0–34.0)
MCHC: 33 g/dL (ref 30.0–36.0)
MCV: 95 fL (ref 80.0–100.0)
Platelets: 337 10*3/uL (ref 150–400)
RBC: 4.56 MIL/uL (ref 4.22–5.81)
RDW: 12.2 % (ref 11.5–15.5)
WBC: 8.9 10*3/uL (ref 4.0–10.5)
nRBC: 0 % (ref 0.0–0.2)

## 2022-11-19 MED ORDER — TRAZODONE HCL 50 MG PO TABS
50.0000 mg | ORAL_TABLET | Freq: Every evening | ORAL | Status: DC | PRN
  Administered 2022-11-19 – 2022-11-24 (×6): 50 mg via ORAL
  Filled 2022-11-19 (×7): qty 1

## 2022-11-19 NOTE — ED Notes (Signed)
Safe patient arrived for patient, Michael [name], Chief Financial Officer with patient for transport

## 2022-11-19 NOTE — Group Note (Signed)
Date:  11/19/2022 Time:  9:19 AM  Group Topic/Focus:  Goals Group:   The focus of this group is to help patients establish daily goals to achieve during treatment and discuss how the patient can incorporate goal setting into their daily lives to aide in recovery.    Participation Level:  Did Not Attend  Participation Quality:   n/a  Affect:   n/a  Cognitive:   n/a  Insight: None  Engagement in Group:   n/a  Modes of Intervention:   n/a  Additional Comments:  n/a  Javayah Magaw D Caydon Feasel 11/19/2022, 9:19 AM

## 2022-11-19 NOTE — ED Notes (Deleted)
Pt here from [facility] for HA and TIA hx. Intermittent HA since Wednesday with weakness, vision changes, intermittent numbness. Denies these complaints at this time. Also c/o of tinnitus. States that he is going through withdrawal from Xanax.
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
## 2022-11-19 NOTE — ED Triage Notes (Addendum)
Pt here from [facility] for HA and TIA hx. Intermittent HA since Wednesday with weakness, vision changes, intermittent numbness. Denies these complaints at this time. Also c/o of tinnitus. States that he is going through withdrawal from Xanax.

## 2022-11-19 NOTE — ED Notes (Signed)
[facility] Patient [facility] called for transportation back to [facility]

## 2022-11-19 NOTE — ED Provider Notes (Signed)
[facility] EMERGENCY DEPARTMENT AT [facility] Provider Note   CSN: [id number] Arrival date & time: 11/19/22  1538     History  Chief Complaint  Patient presents with   Headache    Michael [name] is a 47 y.o. male.  Pt is a 47 yo male with pmhx significant for Xanax abuse, hx TIA, and anxiety.  Pt said he's been at [facility] and has been having vision changes and headache.  They wanted to make sure he was not having a stroke, so they sent him in for further eval.  Pt said he feels fine now.  He thinks it is because he is withdrawing off Xanax.       Home Medications Prior to Admission medications   Medication Sig Start Date End Date Taking? Authorizing Provider  acetaminophen (TYLENOL) 500 MG tablet Take 500-1,000 mg by mouth every 6 (six) hours as needed for mild pain or headache.    [provider]  ALPRAZolam Prudy Feeler) 1 MG tablet Take 1-2 mg by mouth 3 (three) times daily as needed for anxiety.    [provider]  amoxicillin-clavulanate (AUGMENTIN) 875-125 MG per tablet Take 1 tablet by mouth every 12 (twelve) hours. Patient not taking: Reported on 11/14/2022 08/25/14   Patel-Mills, Lorelle Formosa, PA-C  amphetamine-dextroamphetamine (ADDERALL) 20 MG tablet Take 20 mg by mouth 3 (three) times daily.    [provider]  aspirin 81 MG chewable tablet Chew 1 tablet (81 mg total) by mouth daily. Patient not taking: Reported on 11/14/2022 01/16/15   Shon Baton, MD  DESCOVY 200-25 MG tablet Take 1 tablet by mouth daily.    [provider]  desvenlafaxine (PRISTIQ) 100 MG 24 hr tablet Take 100 mg by mouth every morning. 09/03/21   [provider]  doxycycline (ADOXA) 100 MG tablet Take 200 mg by mouth See admin instructions. "Take 200 mg (2 tablets) by mouth up to 72 hours after intercourse. Do not exceed more than 200 mg (two tablets) in 24 hrs."    [provider]  oxyCODONE (OXY IR/ROXICODONE) 5 MG immediate release tablet Take  0.5-1 tablets (2.5-5 mg total) by mouth every 6 (six) hours as needed for severe pain. Patient not taking: Reported on 11/14/2022 01/29/15   Arthor Captain, PA-C  propranolol (INDERAL) 20 MG tablet Take 20 mg by mouth 3 (three) times daily as needed (for migraines).    [provider]  VISINE DRY EYE RELIEF 1 % SOLN Apply 1 drop to eye 2 (two) times daily as needed (for irritation).    [provider]      Allergies    Melatonin, Neurontin [gabapentin], Trazodone and nefazodone, and Tylenol pm extra strength [diphenhydramine-apap (sleep)]    Review of Systems   Review of Systems  Neurological:  Positive for headaches.  All other systems reviewed and are negative.   Physical Exam Updated Vital Signs BP 117/89 (BP Location: Left Arm)   Pulse 86   Temp 98.8 F (37.1 C) (Oral)   Resp 15   Ht 5\' 11"  (1.803 m)   Wt 71.7 kg   SpO2 98%   BMI 22.04 kg/m  Physical Exam Vitals and nursing note reviewed.  Constitutional:      Appearance: He is well-developed.  HENT:     Head: Normocephalic and atraumatic.     Mouth/Throat:     Mouth: Mucous membranes are moist.     Pharynx: Oropharynx is clear.  Eyes:     Extraocular Movements:  Extraocular movements intact.     Pupils: Pupils are equal, round, and reactive to light.  Cardiovascular:     Rate and Rhythm: Normal rate and regular rhythm.     Heart sounds: Normal heart sounds.  Pulmonary:     Effort: Pulmonary effort is normal.     Breath sounds: Normal breath sounds.  Abdominal:     General: Bowel sounds are normal.     Palpations: Abdomen is soft.  Musculoskeletal:        General: Normal range of motion.     Cervical back: Normal range of motion and neck supple.  Skin:    General: Skin is warm and dry.  Neurological:     Mental Status: He is alert and oriented to person, place, and time.  Psychiatric:        Mood and Affect: Mood normal.        Speech: Speech normal.        Behavior: Behavior normal.      ED Results / Procedures / Treatments   Labs (all labs ordered are listed, but only abnormal results are displayed) Labs Reviewed  BASIC METABOLIC PANEL  CBC  URINALYSIS, ROUTINE W REFLEX MICROSCOPIC  CBG MONITORING, ED    EKG EKG Interpretation Date/Time:  Friday November 19 2022 16:29:25 EDT Ventricular Rate:  84 PR Interval:  122 QRS Duration:  82 QT Interval:  362 QTC Calculation: 427 R Axis:   74  Text Interpretation: Normal sinus rhythm Normal ECG When compared with ECG of 14-Nov-2022 17:23, PREVIOUS ECG IS PRESENT No significant change since last tracing Confirmed by Jacalyn Lefevre [phone]) on 11/19/2022 6:48:26 PM  Radiology CT Head Wo Contrast  Result Date: 11/19/2022 CLINICAL DATA:  Initial evaluation for acute headache. EXAM: CT HEAD WITHOUT CONTRAST TECHNIQUE: Contiguous axial images were obtained from the base of the skull through the vertex without intravenous contrast. RADIATION DOSE REDUCTION: This exam was performed according to the departmental dose-optimization program which includes automated exposure control, adjustment of the mA and/or kV according to patient size and/or use of iterative reconstruction technique. COMPARISON:  Comparison made with MRI from earlier the same day. FINDINGS: Brain: Cerebral volume within normal limits. Mild chronic microvascular ischemic disease. Small remote lacunar infarct at the right periatrial white matter. No acute intracranial hemorrhage. No acute large vessel territory infarct. No mass lesion or midline shift. No hydrocephalus or extra-axial fluid collection. Vascular: No abnormal hyperdense vessel. Skull: Scalp soft tissues within normal limits.  Calvarium intact. Sinuses/Orbits: Globes and orbital soft tissues within normal limits. Paranasal sinuses and mastoid air cells are clear. Other: None. IMPRESSION: 1. No acute intracranial abnormality. 2. Mild chronic microvascular ischemic disease with small remote lacunar infarct  at the right periatrial white matter. Electronically Signed   By: Rise Mu M.D.   On: 11/19/2022 18:50   MR BRAIN WO CONTRAST  Result Date: 11/19/2022 CLINICAL DATA:  Initial evaluation for acute headache. EXAM: MRI HEAD WITHOUT CONTRAST TECHNIQUE: Multiplanar, multiecho pulse sequences of the brain and surrounding structures were obtained without intravenous contrast. COMPARISON:  Prior study from 11/20/2021 and 01/16/2015. FINDINGS: Brain: Cerebral volume within normal limits. Scattered patchy T2/FLAIR hyperintensity involving the periventricular and deep white matter both cerebral hemispheres as well as the pons, most likely related chronic microvascular ischemic disease, mild in nature. Remote lacunar infarct present at the right periatrial white matter. No evidence for acute or subacute ischemia. Gray-white matter differentiation maintained. No areas of chronic cortical infarction. No acute or chronic intracranial  blood products. No mass lesion, midline shift or mass effect. No hydrocephalus or extra-axial fluid collection. Pituitary gland and suprasellar region within normal limits. No findings to suggest idiopathic intracranial hypertension. Vascular: Major intracranial vascular flow voids are maintained. Skull and upper cervical spine: Mild osteoarthritic changes about the skull base. Bone marrow signal intensity within normal limits. No scalp soft tissue abnormality. Sinuses/Orbits: Globes and orbital soft tissues within normal limits. Paranasal sinuses are largely clear. No significant mastoid effusion. Other: None. IMPRESSION: 1. No acute intracranial abnormality. 2. Mild chronic microvascular ischemic disease with superimposed remote lacunar infarct involving the right periatrial white matter. Electronically Signed   By: Rise Mu M.D.   On: 11/19/2022 18:48    Procedures Procedures    Medications Ordered in ED Medications  alum & mag hydroxide-simeth (MAALOX/MYLANTA)  200-200-20 MG/5ML suspension 30 mL (has no administration in time range)  haloperidol (HALDOL) tablet 5 mg (has no administration in time range)    Or  haloperidol lactate (HALDOL) injection 5 mg (has no administration in time range)  diphenhydrAMINE (BENADRYL) capsule 50 mg (has no administration in time range)    Or  diphenhydrAMINE (BENADRYL) injection 50 mg (has no administration in time range)  nicotine polacrilex (NICORETTE) gum 2 mg (2 mg Oral Given 11/16/22 0936)  multivitamin with minerals tablet 1 tablet (1 tablet Oral Given 11/19/22 0807)  LORazepam (ATIVAN) tablet 1 mg (has no administration in time range)  hydrOXYzine (ATARAX) tablet 25 mg (25 mg Oral Given 11/18/22 2336)  loperamide (IMODIUM) capsule 2-4 mg (has no administration in time range)  ondansetron (ZOFRAN-ODT) disintegrating tablet 4 mg (has no administration in time range)  LORazepam (ATIVAN) tablet 1 mg (1 mg Oral Given 11/16/22 2058)    Followed by  LORazepam (ATIVAN) tablet 1 mg (1 mg Oral Given 11/17/22 1647)    Followed by  LORazepam (ATIVAN) tablet 1 mg (1 mg Oral Given 11/18/22 1654)    Followed by  LORazepam (ATIVAN) tablet 1 mg (1 mg Oral Given 11/19/22 0807)  nicotine (NICODERM CQ - dosed in mg/24 hours) patch 21 mg (21 mg Transdermal Patch Applied 11/19/22 0808)  venlafaxine XR (EFFEXOR-XR) 24 hr capsule 75 mg (75 mg Oral Given 11/19/22 0807)  busPIRone (BUSPAR) tablet 10 mg (10 mg Oral Given 11/19/22 0807)  ibuprofen (ADVIL) tablet 600 mg (has no administration in time range)  nicotine polacrilex (NICORETTE) 2 MG gum (2 mg  Given 11/15/22 2005)  hydrOXYzine (ATARAX) tablet 25 mg (25 mg Oral Given 11/17/22 0155)    ED Course/ Medical Decision Making/ A&P                                 Medical Decision Making  This patient presents to the ED for concern of headache, this involves an extensive number of treatment options, and is a complaint that carries with it a high risk of complications and  morbidity.  The differential diagnosis includes xanax w/dr, electrolyte abn, tia, cva   Co morbidities that complicate the patient evaluation  Xanax abuse, hx TIA, and anxiety   Additional history obtained:  Additional history obtained from epic chart review  Lab Tests:  I Ordered, and personally interpreted labs.  The pertinent results include:  cbc nl, bmp nl   Imaging Studies ordered:  I ordered imaging studies including ct head/mri brain  I independently visualized and interpreted imaging which showed  MRI brain: No acute intracranial abnormality.  2.  Mild chronic microvascular ischemic disease with superimposed  remote lacunar infarct involving the right periatrial white matter.  CT head: . No acute intracranial abnormality.  2. Mild chronic microvascular ischemic disease with small remote  lacunar infarct at the right periatrial white matter.   I agree with the radiologist interpretation   Cardiac Monitoring:  The patient was maintained on a cardiac monitor.  I personally viewed and interpreted the cardiac monitored which showed an underlying rhythm of: nsr   Medicines ordered and prescription drug management:   I have reviewed the patients home medicines and have made adjustments as needed   Test Considered:  Ct/mri   Critical Interventions:  mri   Problem List / ED Course:  Headaches:  d/c back to [facility]   Reevaluation:  After the interventions noted above, I reevaluated the patient and found that they have :improved   Social Determinants of Health:  Currently at [facility]   Dispostion:  After consideration of the diagnostic results and the patients response to treatment, I feel that the patent would benefit from back to [facility].          Final Clinical Impression(s) / ED Diagnoses Final diagnoses:  Acute nonintractable headache, unspecified headache type  Benzodiazepine withdrawal without complication [facility])    Rx / DC Orders ED  Discharge Orders     None         Jacalyn Lefevre, MD 11/19/22 1904

## 2022-11-19 NOTE — Progress Notes (Signed)
   11/18/22 2047  Psych Admission Type (Psych Patients Only)  Admission Status Voluntary  Psychosocial Assessment  Patient Complaints Depression  Eye Contact Fair  Facial Expression Animated  Affect Depressed  Speech Logical/coherent  Interaction Assertive  Motor Activity Slow  Appearance/Hygiene Unremarkable  Behavior Characteristics Appropriate to situation  Mood Depressed  Aggressive Behavior  Effect No apparent injury  Thought Process  Coherency WDL  Content WDL  Delusions None reported or observed  Perception WDL  Hallucination None reported or observed  Judgment Impaired  Confusion None  Danger to Self  Current suicidal ideation? Denies  Danger to Others  Danger to Others None reported or observed

## 2022-11-19 NOTE — Progress Notes (Signed)
Pt returned from ED, anxious but pleasant.  This Clinical research associate contacted NP and requested/received orders for medication to help him sleep.  Pt states not allergic to Trazodone, "It just makes me hungry".  Pt pleasant at this time, no further complaints received.  Will continue to monitor.   11/19/22 2225  Psych Admission Type (Psych Patients Only)  Admission Status Voluntary  Psychosocial Assessment  Patient Complaints Depression;Anxiety  Eye Contact Fair  Facial Expression Animated  Affect Anxious  Speech Logical/coherent  Interaction Assertive  Motor Activity Other (Comment) (WDL)  Appearance/Hygiene Unremarkable  Behavior Characteristics Appropriate to situation  Mood Anxious;Pleasant  Thought Process  Coherency WDL  Content WDL  Delusions None reported or observed  Perception WDL  Hallucination None reported or observed  Judgment Impaired  Confusion None  Danger to Self  Current suicidal ideation? Denies  Danger to Others  Danger to Others None reported or observed

## 2022-11-19 NOTE — ED Notes (Addendum)
Report called to [facility], to L[facility] Gauss, patient still has a bed available, will transport by [facility] patient [facility].

## 2022-11-19 NOTE — Discharge Instructions (Addendum)
-  Follow-up with your outpatient psychiatric provider -instructions on appointment date, time, and address (location) are provided to you in discharge paperwork.  -Take your psychiatric medications as prescribed at discharge - instructions are provided to you in the discharge paperwork As we discussed, we will provide printed prescriptions at discharge - take these to the pharmacy of your choosing to have your medications filled.   -Follow-up with outpatient primary care doctor and other specialists -for management of preventative medicine and any chronic medical disease.  -Recommend abstinence from alcohol, tobacco, and other illicit drug use at discharge.   -If your psychiatric symptoms recur, worsen, or if you have side effects to your psychiatric medications, call your outpatient psychiatric provider, 911, [phone] or go to the nearest emergency department.  -If suicidal thoughts occur, call your outpatient psychiatric provider, 911, [phone] or go to the nearest emergency department.  Naloxone (Narcan) can help reverse an overdose when given to the victim quickly.  [facility] offers free naloxone kits and instructions/training on its use.  Add naloxone to your first aid kit and you can help save a life.   Pick up your free kit at the following locations:   [place]:  [facility], [address] [place] [address] [phone]) [facility] [address] [place] [address] [phone]) [facility] [address] [place] [address]  [place]: [facility] [address] [place] [address] ([phone]) [facility] [address] [place] [address] [phone])

## 2022-11-19 NOTE — ED Notes (Signed)
Per Dr. Particia Nearing the patient does not need to provide a urine specimen, order can be D/C

## 2022-11-20 DIAGNOSIS — F332 Major depressive disorder, recurrent severe without psychotic features: Secondary | ICD-10-CM | POA: Diagnosis not present

## 2022-11-20 MED ORDER — HYDROXYZINE HCL 25 MG PO TABS
25.0000 mg | ORAL_TABLET | Freq: Three times a day (TID) | ORAL | Status: DC | PRN
  Administered 2022-11-20 – 2022-11-24 (×10): 25 mg via ORAL
  Filled 2022-11-20 (×11): qty 1

## 2022-11-20 NOTE — Group Note (Signed)
Date:  11/20/2022 Time:  11:54 PM  Group Topic/Focus:  Wrap-Up Group:   The focus of this group is to help patients review their daily goal of treatment and discuss progress on daily workbooks.    Participation Level:  Did Not Attend  Participation Quality:   N/A  Affect:   N/A  Cognitive:   N/A  Insight: None  Engagement in Group:   N/A  Modes of Intervention:   N/A  Additional Comments:  Patient did not attend group.   Michael [name] 11/20/2022, 11:54 PM

## 2022-11-20 NOTE — Progress Notes (Signed)
   11/20/22 1000  Psych Admission Type (Psych Patients Only)  Admission Status Voluntary  Psychosocial Assessment  Patient Complaints Depression;Anxiety  Eye Contact Brief  Facial Expression Flat  Affect Depressed;Anxious  Speech Logical/coherent  Interaction Assertive  Motor Activity Slow  Appearance/Hygiene Unremarkable  Behavior Characteristics Appropriate to situation  Mood Anxious;Depressed  Thought Process  Coherency WDL  Content WDL  Delusions None reported or observed  Perception WDL  Hallucination None reported or observed  Judgment Impaired  Confusion None  Danger to Self  Current suicidal ideation? Denies  Agreement Not to Harm Self Yes  Description of Agreement verbal  Danger to Others  Danger to Others None reported or observed

## 2022-11-20 NOTE — Plan of Care (Signed)

## 2022-11-20 NOTE — Plan of Care (Signed)
Problem: Education: Goal: Emotional status will improve Outcome: Progressing   Problem: Education: Goal: Verbalization of understanding the information provided will improve Outcome: Progressing

## 2022-11-20 NOTE — Progress Notes (Signed)
   11/20/22 0539  15 Minute Checks  Location Bedroom  Visual Appearance Calm  Behavior Sleeping  Sleep (Behavioral Health Patients Only)  Calculate sleep? (Click Yes once per 24 hr at 0600 safety check) Yes  Documented sleep last 24 hours 7
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
## 2022-11-20 NOTE — Progress Notes (Signed)
   11/20/22 2256  Psych Admission Type (Psych Patients Only)  Admission Status Voluntary  Psychosocial Assessment  Patient Complaints None  Eye Contact Brief  Facial Expression Animated  Affect Appropriate to circumstance  Speech Logical/coherent  Interaction Assertive  Motor Activity Other (Comment) (WDL)  Appearance/Hygiene Unremarkable  Behavior Characteristics Appropriate to situation  Mood Pleasant  Thought Process  Coherency WDL  Content WDL  Delusions None reported or observed  Perception WDL  Hallucination None reported or observed  Judgment Impaired  Confusion None  Danger to Self  Current suicidal ideation? Denies  Agreement Not to Harm Self Yes  Description of Agreement verbal  Danger to Others  Danger to Others None reported or observed

## 2022-11-21 DIAGNOSIS — F332 Major depressive disorder, recurrent severe without psychotic features: Secondary | ICD-10-CM | POA: Diagnosis not present

## 2022-11-21 DIAGNOSIS — F1320 Sedative, hypnotic or anxiolytic dependence, uncomplicated: Secondary | ICD-10-CM | POA: Diagnosis present

## 2022-11-21 MED ORDER — VENLAFAXINE HCL ER 150 MG PO CP24
150.0000 mg | ORAL_CAPSULE | Freq: Every day | ORAL | Status: DC
  Administered 2022-11-22 – 2022-11-25 (×4): 150 mg via ORAL
  Filled 2022-11-21 (×6): qty 1

## 2022-11-21 NOTE — Progress Notes (Signed)
Patient denies SI, HI, and AVH this shift. Patient reports unresolved anxiety. Patient has been isolative to self this shift. Patient has had no incidents of behavioral dyscontrol.   Assess patient for safety, offer medications as prescribed, engage patient in 1:1 staff talks.   Continue to monitor as planned. Patient able to maintain safety with the support of staff.

## 2022-11-21 NOTE — Group Note (Signed)
Date:  11/21/2022 Time:  11:00 AM  Group Topic/Focus:  Goals Group:   The focus of this group is to help patients establish daily goals to achieve during treatment and discuss how the patient can incorporate goal setting into their daily lives to aide in recovery.    Participation Level:  Did Not Attend  Participation Quality:      Affect:      Cognitive:      Insight:   Engagement in Group:      Modes of Intervention:       Additional Comments:     Reymundo Poll 11/21/2022, 11:00 AM
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
## 2022-11-21 NOTE — Plan of Care (Signed)
Patient progressing toward therapeutic goals.

## 2022-11-21 NOTE — Plan of Care (Signed)
  Problem: Activity: Goal: Interest or engagement in activities will improve Outcome: Progressing   Problem: Health Behavior/Discharge Planning: Goal: Compliance with treatment plan for underlying cause of condition will improve Outcome: Progressing   

## 2022-11-21 NOTE — [facility] Group Notes (Signed)
Type of Therapy and Topic:  Group Therapy: Gratitude  Participation Level:  Did Not Attend   Description of Group:   In this group, patients shared and discussed the importance of acknowledging the elements in their lives for which they are grateful and how this can positively impact their mood.  The group discussed how bringing the positive elements of their lives to the forefront of their minds can help with recovery from any illness, physical or mental.  An exercise was done as a group in which a list was made of gratitude items in order to encourage participants to consider other potential positives in their lives.  Therapeutic Goals: Patients will identify one or more item for which they are grateful in each of 6 categories:  people, experiences, things, places, skills, and other. Patients will discuss how it is possible to seek out gratitude in even bad situations. Patients will explore other possible items of gratitude that they could remember.   Summary of Patient Progress:  NA  Therapeutic Modalities:   Solution-Focused Therapy Activity

## 2022-11-21 NOTE — Progress Notes (Signed)
   11/21/22 0540  15 Minute Checks  Location Bedroom  Visual Appearance Calm  Behavior Sleeping  Sleep (Behavioral Health Patients Only)  Calculate sleep? (Click Yes once per 24 hr at 0600 safety check) Yes  Documented sleep last 24 hours 5.75

## 2022-11-21 NOTE — Progress Notes (Signed)
  Patient stated his day was "up and down." He rated his anxiety 8/10, depression 7/10, and reports  "poor sleep." Patient denies current SI, HI, & AVH. Patient did not attend group this evening. Patient medicated with Trazodone for sleep per MAR. Patient declined Hydroxyzine when this RN offered, and stated it "makes his mouth dry up." PO fluids offered and encouraged. No s/s of distress noted, patient verbally contract for safety, patient kept safe on the unit. Routine safety checks maintained.    11/21/22 2130  Psych Admission Type (Psych Patients Only)  Admission Status Voluntary  Psychosocial Assessment  Patient Complaints Anxiety;Depression;Insomnia  Eye Contact Brief  Facial Expression Animated  Affect Anxious  Speech Logical/coherent  Interaction Assertive  Motor Activity Slow  Appearance/Hygiene Unremarkable  Behavior Characteristics Appropriate to situation  Mood Anxious  Thought Process  Coherency WDL  Content WDL  Delusions None reported or observed  Perception WDL  Hallucination None reported or observed  Judgment Impaired  Confusion None  Danger to Self  Current suicidal ideation? Denies  Agreement Not to Harm Self Yes  Description of Agreement verbal  Danger to Others  Danger to Others None reported or observed

## 2022-11-21 NOTE — Progress Notes (Signed)
Complains if not feeling well, when further assess patient states he feels like he isn't present and is disconnected. Patient currently rates anxiety as an 8/10. Patient offered antianxiety medication. Will continue to monitor

## 2022-11-21 NOTE — [facility] Group Notes (Signed)
[facility] Group Notes:  (Nursing/MHT/Case Management/Adjunct)  Date:  11/21/2022  Time:  9:25 PM  Type of Therapy:   Wrap-up group  Participation Level:  Did Not Attend  Participation Quality:    Affect:    Cognitive:    Insight:    Engagement in Group:    Modes of Intervention:    Summary of Progress/Problems: Pt refused to attend group.  Michael [name] 11/21/2022, 9:25 PM

## 2022-11-22 ENCOUNTER — Encounter (HOSPITAL_COMMUNITY): Payer: Self-pay

## 2022-11-22 DIAGNOSIS — F332 Major depressive disorder, recurrent severe without psychotic features: Secondary | ICD-10-CM | POA: Diagnosis not present

## 2022-11-22 MED ORDER — TRAZODONE HCL 50 MG PO TABS
50.0000 mg | ORAL_TABLET | Freq: Once | ORAL | Status: AC
  Administered 2022-11-22: 50 mg via ORAL
  Filled 2022-11-22: qty 1

## 2022-11-22 MED ORDER — MIRTAZAPINE 15 MG PO TABS
15.0000 mg | ORAL_TABLET | Freq: Every day | ORAL | Status: DC
  Administered 2022-11-22 – 2022-11-24 (×3): 15 mg via ORAL
  Filled 2022-11-22 (×5): qty 1

## 2022-11-22 NOTE — Progress Notes (Signed)
   11/22/22 4098  15 Minute Checks  Location Hallway  Visual Appearance Calm  Behavior Composed  Sleep (Behavioral Health Patients Only)  Calculate sleep? (Click Yes once per 24 hr at 0600 safety check) Yes  Documented sleep last 24 hours 3

## 2022-11-22 NOTE — Group Note (Signed)
Recreation Therapy Group Note   Group Topic:Stress Management  Group Date: 11/22/2022 Start Time: 0935 End Time: 1001 Facilitators: Ksenia Kunz-McCall, LRT,CTRS Location: [address] Dayroom   Group Topic: Stress Management  Goal Area(s) Addresses:  Patient will identify positive stress management techniques. Patient will identify benefits of using stress management post d/c.  Group Description: Meditation. LRT played a meditation that focused on being present, living in gratitude and bringing joy into your day.     Education: Stress Management, Discharge Planning.   Education Outcome: Acknowledges Education   Affect/Mood: N/A   Participation Level: Did not attend    Clinical Observations/Individualized Feedback:     Plan: Continue to engage patient in RT group sessions 2-3x/week.   Idali Lafever-McCall, LRT,CTRS  11/22/2022 12:36 PM

## 2022-11-22 NOTE — Progress Notes (Signed)
Patient still up and awake. Patient reports difficulty falling asleep. Provider contacted, Trazodone 50 mg PO x 1 ordered, same administered. Patient resting in bed, no s/s of distress noted.

## 2022-11-22 NOTE — Plan of Care (Signed)
Problem: Education: Goal: Emotional status will improve Outcome: Progressing Goal: Mental status will improve Outcome: Progressing   Problem: Activity: Goal: Interest or engagement in activities will improve Outcome: Progressing   Problem: Safety: Goal: Periods of time without injury will increase Outcome: Progressing   Problem: Coping: Goal: Coping ability will improve Outcome: Progressing

## 2022-11-22 NOTE — BH IP Treatment Plan (Signed)
Interdisciplinary Treatment and Diagnostic Plan Update  11/22/2022 Time of Session: 9:20am (UPDATE) KARAM DUNSON MRN: [id number]  Principal Diagnosis: Major depressive disorder, recurrent severe without psychotic features (HCC)  Secondary Diagnoses: Principal Problem:   Major depressive disorder, recurrent severe without psychotic features (HCC) Active Problems:   Homelessness   Panic disorder   Sedative dependence (HCC)   Current Medications:  Current Facility-Administered Medications  Medication Dose Route Frequency Provider Last Rate Last Admin   alum & mag hydroxide-simeth (MAALOX/MYLANTA) 200-200-20 MG/5ML suspension 30 mL  30 mL Oral Q4H PRN Welford Roche, Josephine C, NP       busPIRone (BUSPAR) tablet 10 mg  10 mg Oral BID Cecilie Lowers, FNP   10 mg at 11/22/22 4098   diphenhydrAMINE (BENADRYL) capsule 50 mg  50 mg Oral TID PRN Earney Navy, NP       Or   diphenhydrAMINE (BENADRYL) injection 50 mg  50 mg Intramuscular TID PRN Dahlia Byes C, NP       haloperidol (HALDOL) tablet 5 mg  5 mg Oral TID PRN Dahlia Byes C, NP       Or   haloperidol lactate (HALDOL) injection 5 mg  5 mg Intramuscular TID PRN Earney Navy, NP       hydrOXYzine (ATARAX) tablet 25 mg  25 mg Oral TID PRN Roselle Locus, MD   25 mg at 11/22/22 0124   ibuprofen (ADVIL) tablet 600 mg  600 mg Oral Q6H PRN Roselle Locus, MD       multivitamin with minerals tablet 1 tablet  1 tablet Oral Daily Hill, Shelbie Hutching, MD   1 tablet at 11/22/22 1191   nicotine (NICODERM CQ - dosed in mg/24 hours) patch 21 mg  21 mg Transdermal Daily Abbott Pao, Nadir, MD   21 mg at 11/22/22 4782   nicotine polacrilex (NICORETTE) gum 2 mg  2 mg Oral PRN Phineas Inches, MD   2 mg at 11/20/22 2101   traZODone (DESYREL) tablet 50 mg  50 mg Oral QHS PRN Jearld Lesch, NP   50 mg at 11/21/22 2128   venlafaxine XR (EFFEXOR-XR) 24 hr capsule 150 mg  150 mg Oral Q breakfast Roselle Locus, MD    150 mg at 11/22/22 0803   PTA Medications: Medications Prior to Admission  Medication Sig Dispense Refill Last Dose   acetaminophen (TYLENOL) 500 MG tablet Take 500-1,000 mg by mouth every 6 (six) hours as needed for mild pain or headache.      ALPRAZolam (XANAX) 1 MG tablet Take 1-2 mg by mouth 3 (three) times daily as needed for anxiety.      amoxicillin-clavulanate (AUGMENTIN) 875-125 MG per tablet Take 1 tablet by mouth every 12 (twelve) hours. (Patient not taking: Reported on 11/14/2022) 14 tablet 0    amphetamine-dextroamphetamine (ADDERALL) 20 MG tablet Take 20 mg by mouth 3 (three) times daily.      aspirin 81 MG chewable tablet Chew 1 tablet (81 mg total) by mouth daily. (Patient not taking: Reported on 11/14/2022) 30 tablet 0    DESCOVY 200-25 MG tablet Take 1 tablet by mouth daily.      desvenlafaxine (PRISTIQ) 100 MG 24 hr tablet Take 100 mg by mouth every morning.      doxycycline (ADOXA) 100 MG tablet Take 200 mg by mouth See admin instructions. "Take 200 mg (2 tablets) by mouth up to 72 hours after intercourse. Do not exceed more than 200 mg (two tablets) in 24 hrs."  oxyCODONE (OXY IR/ROXICODONE) 5 MG immediate release tablet Take 0.5-1 tablets (2.5-5 mg total) by mouth every 6 (six) hours as needed for severe pain. (Patient not taking: Reported on 11/14/2022) 20 tablet 0    propranolol (INDERAL) 20 MG tablet Take 20 mg by mouth 3 (three) times daily as needed (for migraines).      VISINE DRY EYE RELIEF 1 % SOLN Apply 1 drop to eye 2 (two) times daily as needed (for irritation).       Patient Stressors:    Patient Strengths:    Treatment Modalities: Medication Management, Group therapy, Case management,  1 to 1 session with clinician, Psychoeducation, Recreational therapy.   Physician Treatment Plan for Primary Diagnosis: Major depressive disorder, recurrent severe without psychotic features (HCC) Long Term Goal(s): Improvement in symptoms so as ready for discharge    Short Term Goals: Ability to identify changes in lifestyle to reduce recurrence of condition will improve Ability to verbalize feelings will improve Ability to disclose and discuss suicidal ideas Ability to demonstrate self-control will improve Ability to identify and develop effective coping behaviors will improve  Medication Management: Evaluate patient's response, side effects, and tolerance of medication regimen.  Therapeutic Interventions: 1 to 1 sessions, Unit Group sessions and Medication administration.  Evaluation of Outcomes: Progressing  Physician Treatment Plan for Secondary Diagnosis: Principal Problem:   Major depressive disorder, recurrent severe without psychotic features (HCC) Active Problems:   Homelessness   Panic disorder   Sedative dependence (HCC)  Long Term Goal(s): Improvement in symptoms so as ready for discharge   Short Term Goals: Ability to identify changes in lifestyle to reduce recurrence of condition will improve Ability to verbalize feelings will improve Ability to disclose and discuss suicidal ideas Ability to demonstrate self-control will improve Ability to identify and develop effective coping behaviors will improve     Medication Management: Evaluate patient's response, side effects, and tolerance of medication regimen.  Therapeutic Interventions: 1 to 1 sessions, Unit Group sessions and Medication administration.  Evaluation of Outcomes: Progressing   RN Treatment Plan for Primary Diagnosis: Major depressive disorder, recurrent severe without psychotic features (HCC) Long Term Goal(s): Knowledge of disease and therapeutic regimen to maintain health will improve  Short Term Goals: Ability to remain free from injury will improve, Ability to verbalize frustration and anger appropriately will improve, Ability to participate in decision making will improve, Ability to verbalize feelings will improve, Ability to identify and develop effective  coping behaviors will improve, and Compliance with prescribed medications will improve  Medication Management: RN will administer medications as ordered by provider, will assess and evaluate patient's response and provide education to patient for prescribed medication. RN will report any adverse and/or side effects to prescribing provider.  Therapeutic Interventions: 1 on 1 counseling sessions, Psychoeducation, Medication administration, Evaluate responses to treatment, Monitor vital signs and CBGs as ordered, Perform/monitor CIWA, COWS, AIMS and Fall Risk screenings as ordered, Perform wound care treatments as ordered.  Evaluation of Outcomes: Progressing   LCSW Treatment Plan for Primary Diagnosis: Major depressive disorder, recurrent severe without psychotic features (HCC) Long Term Goal(s): Safe transition to appropriate next level of care at discharge, Engage patient in therapeutic group addressing interpersonal concerns.  Short Term Goals: Engage patient in aftercare planning with referrals and resources, Increase social support, Increase emotional regulation, Facilitate acceptance of mental health diagnosis and concerns, Identify triggers associated with mental health/substance abuse issues, and Increase skills for wellness and recovery  Therapeutic Interventions: Assess for all discharge needs, 1  to 1 time with Child psychotherapist, Explore available resources and support systems, Assess for adequacy in community support network, Educate family and significant other(s) on suicide prevention, Complete Psychosocial Assessment, Interpersonal group therapy.  Evaluation of Outcomes: Progressing   Progress in Treatment: Attending groups: Yes. Participating in groups: Yes. Taking medication as prescribed: Yes. Toleration medication: Yes. Family/Significant other contact made: No, will contact:  Consents Patient understands diagnosis: Yes. Discussing patient identified problems/goals with staff:  Yes. Medical problems stabilized or resolved: Yes. Denies suicidal/homicidal ideation: Yes. Issues/concerns per patient self-inventory: Yes.   New problem(s) identified: No, Describe:  None Reported   New Short Term/Long Term Goal(s): medication stabilization, elimination of SI thoughts, development of comprehensive mental wellness plan.    Patient Goals:  Medication Stabilization   Discharge Plan or Barriers: Patient recently admitted will continue to monitor   Reason for Continuation of Hospitalization: Anxiety Depression Medication stabilization Suicidal ideation Withdrawal symptoms   Estimated Length of Stay: 5-7 Days Last 3 [place] Suicide Severity Risk Score: Flowsheet Row ED to Hosp-Admission (Current) [facility] from [facility]14/2022 in [facility] ED from 01/03/2022 in [facility] Emergency Department at [facility]  C-SSRS RISK CATEGORY No Risk Low Risk No Risk       Last PHQ 2/9 Scores:     No data to display          Scribe for Treatment Team: Izell [place], LCSW 11/22/2022 9:48 AM

## 2022-11-22 NOTE — Progress Notes (Signed)
Hydroxyzine 25 mg PO administered for anxiety.

## 2022-11-22 NOTE — [facility] Group Notes (Signed)
[facility] Group Notes:  (Nursing/MHT/Case Management/Adjunct)  Date:  11/22/2022  Time:  9:46 PM  Type of Therapy:  Group Therapy  Participation Level:  Did Not Attend  Participation Quality:  Resistant  Affect:   Patient did not attend  Cognitive:  Lacking  Insight:  None  Engagement in Group:  None  Modes of Intervention:  Education  Summary of Progress/Problems: The patient did not attend group this evening.   Hazle Coca S 11/22/2022, 9:46 PM

## 2022-11-22 NOTE — Progress Notes (Signed)
D: Patient is alert, oriented, pleasant, and cooperative. Denies SI, HI, AVH, and verbally contracts for safety. Patient reports he slept poorly last night with sleeping medication. Patient reports his appetite as good, energy level as normal, and concentration as good. Patient rates his depression 4/10, hopelessness 4/10, and anxiety 5/10. Patient reports headache rated 4/10.   A: Scheduled medications administered per MD order. PRN ibuprofen and hydroxyzine administered with effectiveness. Support provided. Patient educated on safety on the unit and medications. Routine safety checks every 15 minutes. Patient stated understanding to tell nurse about any new physical symptoms. Patient understands to tell staff of any needs.     R: No adverse drug reactions noted. Patient remains safe at this time and will continue to monitor.    11/22/22 1000  Psych Admission Type (Psych Patients Only)  Admission Status Voluntary  Psychosocial Assessment  Patient Complaints Insomnia;Anxiety  Eye Contact Brief  Facial Expression Animated  Affect Anxious  Speech Logical/coherent  Interaction Assertive  Motor Activity Slow  Appearance/Hygiene Unremarkable  Behavior Characteristics Appropriate to situation  Mood Anxious  Thought Process  Coherency WDL  Content WDL  Delusions None reported or observed  Perception WDL  Hallucination None reported or observed  Judgment Impaired  Confusion None  Danger to Self  Current suicidal ideation? Denies  Agreement Not to Harm Self Yes  Description of Agreement verbal  Danger to Others  Danger to Others None reported or observed

## 2022-11-22 NOTE — Progress Notes (Signed)
   11/22/22 2245  Psych Admission Type (Psych Patients Only)  Admission Status Voluntary  Psychosocial Assessment  Patient Complaints Anxiety;Insomnia  Eye Contact Fair  Facial Expression Animated  Affect Anxious  Speech Logical/coherent  Interaction Assertive  Motor Activity Slow  Appearance/Hygiene Unremarkable  Behavior Characteristics Appropriate to situation  Mood Anxious  Aggressive Behavior  Effect No apparent injury  Thought Process  Coherency WDL  Content WDL  Delusions None reported or observed  Perception WDL  Hallucination None reported or observed  Judgment Impaired  Danger to Self  Current suicidal ideation? Denies

## 2022-11-22 NOTE — Group Note (Signed)
Date:  11/22/2022 Time:  12:03 PM  Group Topic/Focus:  Dimensions of Wellness:   The focus of this group is to introduce the topic of wellness and discuss the role each dimension of wellness plays in total health.    Participation Level:  Did Not Attend  Participation Quality:      Affect:      Cognitive:      Insight: None  Engagement in Group:      Modes of Intervention:      Additional Comments:   Beckie Busing 11/22/2022, 12:03 PM

## 2022-11-22 NOTE — BHH Counselor (Signed)
  11/22/2022  3:42 PM   Michael [name]  Type of note: resources   CSW provided shelter/housing resources to [employer] for [employer].   Signed:  Marya Landry MSW, [facility] 11/22/2022  3:42 PM

## 2022-11-22 NOTE — Progress Notes (Signed)
[facility] MD Progress Note  11/22/2022 3:01 PM JAICOB DIA  MRN:  [id number]  Subjective:   Michael [name], 47 y.o. Caucasian male with prior psychiatric history significant for MDD, homelessness, GAD and panic attacks, who presents voluntarily to [facility] from [facility] health ED at [facility] for worsening chronic depression with suicidal ideation without specific plan in the context of homelessness and frequent panic attacks.  After medical evaluation/stabilization & clearance, he was transferred to the [facility] for further psychiatric evaluation & treatments.   On my assessment today, the patient reports that his mood is still somewhat down and depressed but less so compared to the last 3 days.  Reports anxiety [name] is still very high.  Reports feeling "woozy and fuzzy headed.  I feel disoriented".  We discussed this is probably a prolonged benzodiazepine withdrawal symptoms, after being on a high dose of Xanax for 25 years (per patient report).  He reports that sleep was poor with difficulty initiating and maintaining sleep.  We discussed this is also likely due to benzodiazepine withdrawal.  We discussed that acutely, he is completed Ativan taper but is brain is still adjusting to disturbances in GABAergic imbalance.  Patient reports appetite is better.  Denies any SI or HI.  Denies any psychotic symptoms.  We discussed starting Remeron to address sleep, anxiety, and mood, the patient is agreeable to this.  Reports taking Remeron in the past with good effect.  I discussed with him that it is my thinking that a lot of these symptoms due to GABAergic imbalance, including sleep, might not respond to medications like Remeron, and that it might take time for his GABAergic and glutamate symptoms to become more in balance.   Principal Problem: Major depressive disorder, recurrent severe without psychotic features (HCC) Diagnosis: Principal Problem:   Major depressive disorder, recurrent severe without  psychotic features (HCC) Active Problems:   GAD (generalized anxiety disorder)   Homelessness   Panic disorder   Severe benzodiazepine use disorder (HCC)  Total Time spent with patient: 20 minutes  Past Psychiatric History:  Previous Psych Diagnoses: Chronic MDD, GAD, panic attack Prior inpatient treatment: Yes at age 69 years old, due to severe panic attack Current/prior outpatient treatment: Seen by a psychiatrist at [facility] Prior rehab hx: Denies Psychotherapy hx: As History of suicide: Yes, history of drug overdose in 2023 History of homicide or aggression: Denies Psychiatric medication history: Yes, patient has been on a trial of trazodone, Pristiq, Zyprexa, Adderall, BuSpar, and Xanax. Psychiatric medication compliance history: Noncompliance due to affordability Neuromodulation history: Denies Current Psychiatrist yes at [facility] Current therapist: Denies  Past Medical History:  Past Medical History:  Diagnosis Date   Anxiety    TGA (transient global amnesia)    TIA (transient ischemic attack)    History reviewed. No pertinent surgical history. Family History: History reviewed. No pertinent family history. Family Psychiatric  History: See H&P   Social History:  Social History   Substance and Sexual Activity  Alcohol Use Not Currently   Comment: rarely     Social History   Substance and Sexual Activity  Drug Use No    Social History   Socioeconomic History   Marital status: Single    Spouse name: Not on file   Number of children: Not on file   Years of education: Not on file   Highest education [name]: Not on file  Occupational History   Not on file  Tobacco Use  Smoking status: Some Days    Current packs/day: 0.50    Average packs/day: 0.5 packs/day for 20.0 years (10.0 ttl pk-yrs)    Types: Cigarettes   Smokeless tobacco: Not on file  Vaping Use   Vaping status: Every Day   Substances: Nicotine, Flavoring   Substance and Sexual Activity   Alcohol use: Not Currently    Comment: rarely   Drug use: No   Sexual activity: Yes    Birth control/protection: None  Other Topics Concern   Not on file  Social History Narrative   Not on file   Social Determinants of Health   Financial Resource Strain: Not on file  Food Insecurity: Food Insecurity Present (11/15/2022)   Hunger Vital Sign    Worried About Running Out of Food in the Last Year: Often true    Ran Out of Food in the Last Year: Often true  Transportation Needs: Unmet Transportation Needs (11/15/2022)   PRAPARE - Administrator, Civil Service (Medical): Yes    Lack of Transportation (Non-Medical): Yes  Physical Activity: Not on file  Stress: Not on file  Social Connections: Unknown (09/01/2022)   Received from [employer]   Social Network    Social Network: Not on file   Additional Social History:                           Current Medications: Current Facility-Administered Medications  Medication Dose Route Frequency Provider Last Rate Last Admin   alum & mag hydroxide-simeth (MAALOX/MYLANTA) 200-200-20 MG/5ML suspension 30 mL  30 mL Oral Q4H PRN Dahlia Byes C, NP       busPIRone (BUSPAR) tablet 10 mg  10 mg Oral BID Cecilie Lowers, FNP   10 mg at 11/22/22 3474   diphenhydrAMINE (BENADRYL) capsule 50 mg  50 mg Oral TID PRN Earney Navy, NP       Or   diphenhydrAMINE (BENADRYL) injection 50 mg  50 mg Intramuscular TID PRN Dahlia Byes C, NP       haloperidol (HALDOL) tablet 5 mg  5 mg Oral TID PRN Dahlia Byes C, NP       Or   haloperidol lactate (HALDOL) injection 5 mg  5 mg Intramuscular TID PRN Dahlia Byes C, NP       hydrOXYzine (ATARAX) tablet 25 mg  25 mg Oral TID PRN Roselle Locus, MD   25 mg at 11/22/22 1000   ibuprofen (ADVIL) tablet 600 mg  600 mg Oral Q6H PRN Roselle Locus, MD   600 mg at 11/22/22 1430   mirtazapine (REMERON) tablet 15 mg  15 mg Oral  QHS Darla Mcdonald, MD       multivitamin with minerals tablet 1 tablet  1 tablet Oral Daily Hill, Shelbie Hutching, MD   1 tablet at 11/22/22 2595   nicotine (NICODERM CQ - dosed in mg/24 hours) patch 21 mg  21 mg Transdermal Daily Attiah, Nadir, MD   21 mg at 11/22/22 6387   nicotine polacrilex (NICORETTE) gum 2 mg  2 mg Oral PRN Beaumont Austad, Harrold Donath, MD   2 mg at 11/20/22 2101   traZODone (DESYREL) tablet 50 mg  50 mg Oral QHS PRN Jearld Lesch, NP   50 mg at 11/21/22 2128   venlafaxine XR (EFFEXOR-XR) 24 hr capsule 150 mg  150 mg Oral Q breakfast Roselle Locus, MD   150 mg at 11/22/22 5643    Lab  Results: No results found for this or any previous visit (from the past 48 hour(s)).  Blood Alcohol [name]:  Lab Results  Component Value Date   ETH <10 11/14/2022   ETH <10 11/20/2021    Metabolic Disorder Labs: No results found for: "HGBA1C", "MPG" No results found for: "PROLACTIN" No results found for: "CHOL", "TRIG", "HDL", "CHOLHDL", "VLDL", "LDLCALC"  Physical Findings: AIMS:  , ,  ,  ,    CIWA:  CIWA-Ar Total: 2 COWS:       Psychiatric Specialty Exam:  Presentation  General Appearance:  Casual; Fairly Groomed  Eye Contact: Good  Speech: Normal Rate  Speech Volume: Normal  Handedness: Right   Mood and Affect  Mood: Anxious; Depressed  Affect: Full Range (appearing very anxious, on edge)   Thought Process  Thought Processes: Linear  Descriptions of Associations:Intact  Orientation:Full (Time, Place and Person)  Thought Content:Logical  History of Schizophrenia/Schizoaffective disorder:No data recorded Duration of Psychotic Symptoms:No data recorded Hallucinations:Hallucinations: None  Ideas of Reference:None  Suicidal Thoughts:Suicidal Thoughts: No  Homicidal Thoughts:Homicidal Thoughts: No   Sensorium  Memory: Immediate Fair; Recent Fair; Remote Fair  Judgment: Fair  Insight: Fair   Art therapist   Concentration: Poor  Attention Span: Poor  Recall: Fair  Fund of Knowledge: Good  Language: Good   Psychomotor Activity  Psychomotor Activity: Psychomotor Activity: Normal   Assets  Assets: Communication Skills; Desire for Improvement   Sleep  Sleep: Sleep: Poor    Physical Exam: Physical Exam Vitals reviewed.  Constitutional:      Appearance: He is normal weight.  Pulmonary:     Effort: Pulmonary effort is normal. No respiratory distress.  Neurological:     Mental Status: He is alert.     Motor: No weakness.     Gait: Gait normal.  Psychiatric:        Mood and Affect: Mood is anxious.        Behavior: Behavior normal.        Thought Content: Thought content normal.        Judgment: Judgment normal.    Review of Systems  Constitutional:  Negative for chills and fever.  Cardiovascular:  Negative for chest pain and palpitations.  Neurological:  Negative for dizziness, tingling, tremors and headaches.  Psychiatric/Behavioral:  Positive for memory loss and substance abuse. Negative for depression, hallucinations and suicidal ideas. The patient is nervous/anxious and has insomnia.   All other systems reviewed and are negative.  Blood pressure 107/75, pulse (!) 107, temperature 99 F (37.2 C), temperature source Oral, resp. rate 20, height 5\' 11"  (1.803 m), weight 71.7 kg, SpO2 99%. Body mass index is 22.04 kg/m.   Treatment Plan Summary: Daily contact with patient to assess and evaluate symptoms and progress in treatment and Medication management  ASSESSMENT:  Diagnoses / Active Problems: MDD severe recurrent without psychosis Benzodiazepine use disorder GAD Panic disorder Homelessness    PLAN: Safety and Monitoring:  --  Voluntary admission to inpatient psychiatric unit for safety, stabilization and treatment  -- Daily contact with patient to assess and evaluate symptoms and progress in treatment  -- Patient's case to be discussed in  multi-disciplinary team meeting  -- Observation [name] : q15 minute checks  -- Vital signs:  q12 hours  -- Precautions: suicide, elopement, and assault  2. Psychiatric Diagnoses and Treatment:     Continue Effexor 150 mg once daily Continue BuSpar 10 mg twice daily Start Remeron 15 mg nightly for MDD, GAD, panic disorder,  and insomnia  Already completed Ativan taper for acute benzodiazepine withdrawal  --  The risks/benefits/side-effects/alternatives to this medication were discussed in detail with the patient and time was given for questions. The patient consents to medication trial.    -- Metabolic profile and EKG monitoring obtained while on an atypical antipsychotic (BMI: Lipid Panel: HbgA1c: QTc:)   -- Encouraged patient to participate in unit milieu and in scheduled group therapies       3. Medical Issues Being Addressed:   Tobacco Use Disorder  -- Nicotine patch 21mg /24 hours ordered  -- Smoking cessation encouraged  4. Discharge Planning:   -- Social work and case management to assist with discharge planning and identification of hospital follow-up needs prior to discharge  -- Estimated LOS: 3-4 more days  -- Discharge Concerns: Need to establish a safety plan; Medication compliance and effectiveness  -- Discharge Goals: Return home with outpatient referrals for mental health follow-up including medication management/psychotherapy   Cristy Hilts, MD 11/22/2022, 3:01 PM  Total Time Spent in Direct Patient Care:  I personally spent 35 minutes on the unit in direct patient care. The direct patient care time included face-to-face time with the patient, reviewing the patient's chart, communicating with other professionals, and coordinating care. Greater than 50% of this time was spent in counseling or coordinating care with the patient regarding goals of hospitalization, psycho-education, and discharge planning needs.   Phineas Inches, MD Psychiatrist

## 2022-11-22 NOTE — Group Note (Signed)
Date:  11/22/2022 Time:  11:57 AM  Group Topic/Focus:  Goals Group:   The focus of this group is to help patients establish daily goals to achieve during treatment and discuss how the patient can incorporate goal setting into their daily lives to aide in recovery.    Participation Level:  Did Not Attend  Participation Quality:      Affect:      Cognitive:      Insight: None  Engagement in Group:      Modes of Intervention:      Additional Comments:    Beckie Busing 11/22/2022, 11:57 AM

## 2022-11-23 DIAGNOSIS — F332 Major depressive disorder, recurrent severe without psychotic features: Secondary | ICD-10-CM | POA: Diagnosis not present

## 2022-11-23 NOTE — Group Note (Signed)
LCSW Group Therapy Note   Group Date: 11/23/2022 Start Time: 1100 End Time: 1200  LCSW Group Therapy Note               Type of Therapy and Topic:  Group Therapy: Establishing Boundaries  Participation Level:  Active  In this group, patients learned how to define boundaries, discussed the different types or boundaries with examples.  They identified times that boundaries had been violated and how they reacted.  They analyzed how their reaction was possibly beneficial and how it was possibly unhelpful.  The group discussed how to set boundaries, respect others boundaries and communicate their boundaries. The group utilized a role play scenarios (working with a partner) and discussed how each person in the scenario could have reacted differently and what boundaries they need to implement to improve their life. Patients also discussed consequences to overstepping boundaries and lack of boundaries. Patients discussed how to establish boundaries with clear consequences. Patients will explore discussion questions that address media influence and why it is hard to set boundaries.   Therapeutic Goals: Patients will define boundaries and explore (physical, personal space and language boundaries). Patients will remember their last incident where their boundaries were violated and how they behaved. Patients will practice empathy and understanding of other's boundaries and learn from others in group. Patients will explore how they may have crossed another person's boundaries in the past.  Patients will learn healthy ways to set and communicate boundaries. Patients will actively engage in group activity utilizing role play and critical thinking skills.  Summary of Patient Progress:  Patient was engaged and participated throughout the group session.  Therapeutic Modalities:   Cognitive Behavioral Therapy  Izell [place], LCSW  11/23/2022 1:40 PM

## 2022-11-23 NOTE — B[facility] Group Notes (Signed)
Adult Psychoeducational Group Note  Date:  11/23/2022 Time:  1:45 PM  Group Topic/Focus:  Crisis Planning:   The purpose of this group is to help patients create a crisis plan for use upon discharge or in the future, as needed.  Participation Level:  Active  Participation Quality:  Appropriate  Affect:  Appropriate  Cognitive:  Appropriate  Insight: Appropriate  Engagement in Group:  Engaged  Modes of Intervention:  Discussion  Additional Comments:  Patient attended the [facility] group.  Jearl Klinefelter 11/23/2022, 1:45 PM

## 2022-11-23 NOTE — Group Note (Signed)
Recreation Therapy Group Note   Group Topic:Animal Assisted Therapy   Group Date: 11/23/2022 Start Time: 0954 End Time: 1030 Facilitators: Melecio Cueto-McCall, LRT,CTRS Location: [address] Dayroom   Animal-Assisted Activity (AAA) Program Checklist/Progress Notes Patient Eligibility Criteria Checklist & Daily Group note for Rec Tx Intervention  AAA/T Program Assumption of Risk Form signed by Patient/ or Parent Legal Guardian Yes  Patient understands his/her participation is voluntary Yes  Education: Charity fundraiser, Appropriate Animal Interaction   Education Outcome: Acknowledges education.    Affect/Mood: N/A   Participation Level: Did not attend    Clinical Observations/Individualized Feedback:     Plan: Continue to engage patient in RT group sessions 2-3x/week.   Gayle Martinez-McCall, LRT,CTRS 11/23/2022 1:18 PM

## 2022-11-23 NOTE — [facility] Group Notes (Signed)
[facility] Group Notes:  (Nursing/MHT/Case Management/Adjunct)  Date:  11/23/2022  Time:  9:37 PM  Type of Therapy:   wrap-up group  Participation Level:  Active  Participation Quality:  Appropriate  Affect:  Appropriate  Cognitive:  Appropriate  Insight:  Appropriate  Engagement in Group:  Engaged  Modes of Intervention:  Education  Summary of Progress/Problems: Pt goal to stay positive. Pt reports he met his goal.   Michael [name] 11/23/2022, 9:37 PM

## 2022-11-23 NOTE — Plan of Care (Signed)
  Problem: Education: Goal: Emotional status will improve Outcome: Progressing Goal: Mental status will improve Outcome: Progressing   Problem: Activity: Goal: Interest or engagement in activities will improve Outcome: Progressing Goal: Sleeping patterns will improve Outcome: Progressing   Problem: Safety: Goal: Periods of time without injury will increase Outcome: Progressing   

## 2022-11-23 NOTE — Progress Notes (Signed)
   11/23/22 1000  Psych Admission Type (Psych Patients Only)  Admission Status Voluntary  Psychosocial Assessment  Patient Complaints Anxiety  Eye Contact Fair  Facial Expression Animated  Affect Anxious  Speech Logical/coherent  Interaction Assertive  Motor Activity Slow  Appearance/Hygiene Unremarkable  Behavior Characteristics Appropriate to situation  Mood Anxious  Thought Process  Coherency WDL  Content WDL  Delusions None reported or observed  Perception WDL  Hallucination None reported or observed  Judgment Impaired  Confusion None  Danger to Self  Current suicidal ideation? Denies  Danger to Others  Danger to Others None reported or observed   Dar Note: Patient presents with anxious affect and mood.  Denies suicidal thoughts, auditory and visual hallucinations.  Appears fidgety during assessment.  Medications given as prescribed.  Routine safety checks maintained.  Vistaril 25 mg given for anxiety with fair effect.  Patient is safe on and off the unit.

## 2022-11-23 NOTE — B[facility] Group Notes (Addendum)

## 2022-11-23 NOTE — Progress Notes (Signed)
[facility] MD Progress Note  11/23/2022 2:18 PM Michael Michael [name]  MRN:  [id number]  Subjective:   Michael Michael [name], 47 y.o. Caucasian male with prior psychiatric history significant for MDD, homelessness, GAD and panic attacks, who presents voluntarily to [facility] from [facility] health ED at [facility] for worsening chronic depression with suicidal ideation without specific plan in the context of homelessness and frequent panic attacks.  After medical evaluation/stabilization & clearance, he was transferred to the [facility] for further psychiatric evaluation & treatments.    On my assessment today, the patient reports that sleep is much better after starting Remeron last night.  He reports he was able to fall asleep more quickly and stay asleep with less interruptions with the addition of Remeron.  He reports anxiety Michael [name] continues to be very high.  We discussed this could continue for some time after coming off his Xanax for so many years.  We discussed increasing BuSpar to address this.  He reports his mood is much less depressed and feeling much better.  Appetite is okay.  Concentration is still poor at times, with difficulty consolidating memories.  We discussed how benzodiazepines can affect consolidating memories in the hippocampus.  Denies any SI or HI.  Denies any psychotic symptoms.    Principal Problem: Major depressive disorder, recurrent severe without psychotic features (HCC) Diagnosis: Principal Problem:   Major depressive disorder, recurrent severe without psychotic features (HCC) Active Problems:   GAD (generalized anxiety disorder)   Homelessness   Panic disorder   Severe benzodiazepine use disorder (HCC)  Total Time spent with patient: 20 minutes  Past Psychiatric History:  Previous Psych Diagnoses: Chronic MDD, GAD, panic attack Prior inpatient treatment: Yes at age 67 years old, due to severe panic attack Current/prior outpatient treatment: Seen by a psychiatrist at [facility]  psychiatry Prior rehab hx: Denies Psychotherapy hx: As History of suicide: Yes, history of drug overdose in 2023 History of homicide or aggression: Denies Psychiatric medication history: Yes, patient has been on a trial of trazodone, Pristiq, Zyprexa, Adderall, BuSpar, and Xanax. Psychiatric medication compliance history: Noncompliance due to affordability Neuromodulation history: Denies Current Psychiatrist yes at [facility] psychiatry Current therapist: Denies  Past Medical History:  Past Medical History:  Diagnosis Date   Anxiety    TGA (transient global amnesia)    TIA (transient ischemic attack)    History reviewed. No pertinent surgical history. Family History: History reviewed. No pertinent family history. Family Psychiatric  History: See H&P   Social History:  Social History   Substance and Sexual Activity  Alcohol Use Not Currently   Comment: rarely     Social History   Substance and Sexual Activity  Drug Use No    Social History   Socioeconomic History   Marital status: Single    Spouse name: Not on file   Number of children: Not on file   Years of education: Not on file   Highest education Michael [name]: Not on file  Occupational History   Not on file  Tobacco Use   Smoking status: Some Days    Current packs/day: 0.50    Average packs/day: 0.5 packs/day for 20.0 years (10.0 ttl pk-yrs)    Types: Cigarettes   Smokeless tobacco: Not on file  Vaping Use   Vaping status: Every Day   Substances: Nicotine, Flavoring  Substance and Sexual Activity   Alcohol use: Not Currently    Comment: rarely   Drug use: No   Sexual activity: Yes  Birth control/protection: None  Other Topics Concern   Not on file  Social History Narrative   Not on file   Social Determinants of Health   Financial Resource Strain: Not on file  Food Insecurity: Food Insecurity Present (11/15/2022)   Hunger Vital Sign    Worried About Running Out of Food in the Last Year: Often true     Ran Out of Food in the Last Year: Often true  Transportation Needs: Unmet Transportation Needs (11/15/2022)   PRAPARE - Administrator, Civil Service (Medical): Yes    Lack of Transportation (Non-Medical): Yes  Physical Activity: Not on file  Stress: Not on file  Social Connections: Unknown (09/01/2022)   Received from [employer]   Social Network    Social Network: Not on file   Additional Social History:                           Current Medications: Current Facility-Administered Medications  Medication Dose Route Frequency Provider Last Rate Last Admin   alum & mag hydroxide-simeth (MAALOX/MYLANTA) 200-200-20 MG/5ML suspension 30 mL  30 mL Oral Q4H PRN Dahlia Byes C, NP       busPIRone (BUSPAR) tablet 10 mg  10 mg Oral BID Cecilie Lowers, FNP   10 mg at 11/23/22 0809   diphenhydrAMINE (BENADRYL) capsule 50 mg  50 mg Oral TID PRN Earney Navy, NP       Or   diphenhydrAMINE (BENADRYL) injection 50 mg  50 mg Intramuscular TID PRN Dahlia Byes C, NP       haloperidol (HALDOL) tablet 5 mg  5 mg Oral TID PRN Dahlia Byes C, NP       Or   haloperidol lactate (HALDOL) injection 5 mg  5 mg Intramuscular TID PRN Dahlia Byes C, NP       hydrOXYzine (ATARAX) tablet 25 mg  25 mg Oral TID PRN Roselle Locus, MD   25 mg at 11/22/22 1000   ibuprofen (ADVIL) tablet 600 mg  600 mg Oral Q6H PRN Roselle Locus, MD   600 mg at 11/22/22 1430   mirtazapine (REMERON) tablet 15 mg  15 mg Oral QHS Ariani Seier, Harrold Donath, MD   15 mg at 11/22/22 2222   multivitamin with minerals tablet 1 tablet  1 tablet Oral Daily Roselle Locus, MD   1 tablet at 11/23/22 0809   nicotine (NICODERM CQ - dosed in mg/24 hours) patch 21 mg  21 mg Transdermal Daily Attiah, Nadir, MD   21 mg at 11/22/22 1324   nicotine polacrilex (NICORETTE) gum 2 mg  2 mg Oral PRN Rondi Ivy, Harrold Donath, MD   2 mg at 11/20/22 2101   traZODone (DESYREL) tablet 50 mg  50 mg Oral  QHS PRN Jearld Lesch, NP   50 mg at 11/22/22 2222   venlafaxine XR (EFFEXOR-XR) 24 hr capsule 150 mg  150 mg Oral Q breakfast Roselle Locus, MD   150 mg at 11/23/22 0809    Lab Results: No results found for this or any previous visit (from the past 48 hour(s)).  Blood Alcohol Michael [name]:  Lab Results  Component Value Date   ETH <10 11/14/2022   ETH <10 11/20/2021    Metabolic Disorder Labs: No results found for: "HGBA1C", "MPG" No results found for: "PROLACTIN" No results found for: "CHOL", "TRIG", "HDL", "CHOLHDL", "VLDL", "LDLCALC"  Physical Findings: AIMS:  , ,  ,  ,  CIWA:  CIWA-Ar Total: 3 COWS:       Psychiatric Specialty Exam:  Presentation  General Appearance:  Casual; Fairly Groomed  Eye Contact: Good  Speech: Normal Rate  Speech Volume: Normal  Handedness: Right   Mood and Affect  Mood: Anxious; Less Depressed  Affect: Full Range (appearing very anxious, on edge)   Thought Process  Thought Processes: Linear  Descriptions of Associations:Intact  Orientation:Full (Time, Place and Person)  Thought Content:Logical  History of Schizophrenia/Schizoaffective disorder:No data recorded Duration of Psychotic Symptoms:No data recorded Hallucinations:Hallucinations: None  Ideas of Reference:None  Suicidal Thoughts:Suicidal Thoughts: No  Homicidal Thoughts:Homicidal Thoughts: No   Sensorium  Memory: Immediate Fair; Recent Fair; Remote Fair  Judgment: Fair  Insight: Fair   Art therapist  Concentration: Poor  Attention Span: Poor  Recall: Fair  Fund of Knowledge: Good  Language: Good   Psychomotor Activity  Psychomotor Activity: No data recorded nml  Assets  Assets: Communication Skills; Desire for Improvement   Sleep  Sleep: Sleep: Poor    Physical Exam: Physical Exam Vitals reviewed.  Constitutional:      Appearance: He is normal weight.  Pulmonary:     Effort: Pulmonary effort is  normal. No respiratory distress.  Neurological:     Mental Status: He is alert.     Motor: No weakness.     Gait: Gait normal.  Psychiatric:        Mood and Affect: Mood is anxious.        Behavior: Behavior normal.        Thought Content: Thought content normal.        Judgment: Judgment normal.    Review of Systems  Constitutional:  Negative for chills and fever.  Cardiovascular:  Negative for chest pain and palpitations.  Neurological:  Negative for dizziness, tingling, tremors and headaches.  Psychiatric/Behavioral:  Positive for memory loss and substance abuse. Negative for depression, hallucinations and suicidal ideas. The patient is nervous/anxious and has insomnia.   All other systems reviewed and are negative.  Blood pressure 116/86, pulse (!) 116, temperature 98.4 F (36.9 C), temperature source Oral, resp. rate 20, height 5\' 11"  (1.803 m), weight 71.7 kg, SpO2 100%. Body mass index is 22.04 kg/m.   Treatment Plan Summary: Daily contact with patient to assess and evaluate symptoms and progress in treatment and Medication management  ASSESSMENT:  Diagnoses / Active Problems: MDD severe recurrent without psychosis Benzodiazepine use disorder GAD Panic disorder Homelessness    PLAN: Safety and Monitoring:  --  Voluntary admission to inpatient psychiatric unit for safety, stabilization and treatment  -- Daily contact with patient to assess and evaluate symptoms and progress in treatment  -- Patient's case to be discussed in multi-disciplinary team meeting  -- Observation Michael [name] : q15 minute checks  -- Vital signs:  q12 hours  -- Precautions: suicide, elopement, and assault  2. Psychiatric Diagnoses and Treatment:     Continue Effexor 150 mg once daily Increase BuSpar from 10 mg twice daily to 10 gm tid for anxiety  Continue Remeron 15 mg nightly for MDD, GAD, panic disorder, and insomnia  Already completed Ativan taper for acute benzodiazepine  withdrawal  --  The risks/benefits/side-effects/alternatives to this medication were discussed in detail with the patient and time was given for questions. The patient consents to medication trial.    -- Metabolic profile and EKG monitoring obtained while on an atypical antipsychotic (BMI: Lipid Panel: HbgA1c: QTc:)   -- Encouraged patient to participate in unit milieu  and in scheduled group therapies       3. Medical Issues Being Addressed:   Tobacco Use Disorder  -- Nicotine patch 21mg /24 hours ordered  -- Smoking cessation encouraged  4. Discharge Planning:   -- Social work and case management to assist with discharge planning and identification of hospital follow-up needs prior to discharge  -- Estimated LOS: 3-4 more days  -- Discharge Concerns: Need to establish a safety plan; Medication compliance and effectiveness  -- Discharge Goals: Return home with outpatient referrals for mental health follow-up including medication management/psychotherapy   Cristy Hilts, MD 11/23/2022, 2:18 PM  Total Time Spent in Direct Patient Care:  I personally spent 35 minutes on the unit in direct patient care. The direct patient care time included face-to-face time with the patient, reviewing the patient's chart, communicating with other professionals, and coordinating care. Greater than 50% of this time was spent in counseling or coordinating care with the patient regarding goals of hospitalization, psycho-education, and discharge planning needs.   Phineas Inches, MD Psychiatrist

## 2022-11-23 NOTE — Progress Notes (Signed)
   11/23/22 2315  Psych Admission Type (Psych Patients Only)  Admission Status Voluntary  Psychosocial Assessment  Patient Complaints Anxiety  Eye Contact Fair  Facial Expression Animated  Affect Anxious  Speech Logical/coherent  Interaction Assertive  Motor Activity Slow  Appearance/Hygiene Unremarkable  Behavior Characteristics Cooperative  Mood Anxious  Aggressive Behavior  Effect No apparent injury  Thought Process  Coherency WDL  Content WDL  Delusions None reported or observed  Perception WDL  Hallucination None reported or observed  Judgment Impaired  Danger to Self  Current suicidal ideation? Denies

## 2022-11-24 DIAGNOSIS — F332 Major depressive disorder, recurrent severe without psychotic features: Secondary | ICD-10-CM | POA: Diagnosis not present

## 2022-11-24 MED ORDER — POLYETHYLENE GLYCOL 3350 17 G PO PACK: 17.0000 g | PACK | Freq: Every day | ORAL | Status: DC | PRN

## 2022-11-24 MED ORDER — POLYETHYLENE GLYCOL 3350 17 G PO PACK
17.0000 g | PACK | ORAL | Status: AC
  Administered 2022-11-24: 17 g via ORAL
  Filled 2022-11-24 (×2): qty 1

## 2022-11-24 MED ORDER — DOCUSATE SODIUM 100 MG PO CAPS
100.0000 mg | ORAL_CAPSULE | Freq: Two times a day (BID) | ORAL | Status: DC
  Administered 2022-11-24 – 2022-11-25 (×3): 100 mg via ORAL
  Filled 2022-11-24 (×8): qty 1

## 2022-11-24 NOTE — Group Note (Signed)
Recreation Therapy Group Note   Group Topic:Problem Solving  Group Date: 11/24/2022 Start Time: 0935 End Time: 1015 Facilitators: Allia Wiltsey-McCall, LRT,CTRS Location: [address] Dayroom   Group Topic: Communication, Team Building, Problem Solving  Goal Area(s) Addresses:  Patient will effectively work with peer towards shared goal.  Patient will identify skills used to make activity successful.  Patient will share challenges and verbalize solution-driven approaches used. Patient will identify how skills used during activity can be used to reach post d/c goals.   Intervention: STEM Activity   Group Description: [employer]. Patients were provided the following materials: 4 drinking straws, 5 rubber bands, 5 paper clips, 2 index cards, 2 drinking cups, and 2 toilet paper rolls. Using the provided materials patients were asked to build a launching mechanism to launch a ping pong ball across the room, approximately 10 feet. Patients were divided into teams of 3-5. Instructions required all materials be incorporated into the device, functionality of items left to the peer group's discretion.  Education: Pharmacist, community, Scientist, physiological, Air cabin crew, Building control surveyor.   Education Outcome: Acknowledges education/In group clarification offered/Needs additional education.    Affect/Mood: N/A   Participation Level: Did not attend    Clinical Observations/Individualized Feedback:     Plan: Continue to engage patient in RT group sessions 2-3x/week.   Abimelec Grochowski-McCall, LRT,CTRS  11/24/2022 12:36 PM

## 2022-11-24 NOTE — BHH Group Notes (Signed)
Spiritual care group facilitated by Chaplain Katy Denessa Cavan, BCC  Group focused on topic of strength. Group members reflected on what thoughts and feelings emerge when they hear this topic. They then engaged in facilitated dialog around how strength is present in their lives. This dialog focused on representing what strength had been to them in their lives (images and patterns given) and what they saw as helpful in their life now (what they needed / wanted).  Activity drew on narrative framework.  Patient Progress: Did not attend.  

## 2022-11-24 NOTE — Plan of Care (Signed)
  Problem: Education: Goal: Emotional status will improve Outcome: Progressing Goal: Mental status will improve Outcome: Progressing   Problem: Activity: Goal: Interest or engagement in activities will improve Outcome: Progressing Goal: Sleeping patterns will improve Outcome: Progressing

## 2022-11-24 NOTE — Progress Notes (Signed)
Patient rated his depression level 0/10 and his anxiety level 5/10 with 10 being the highest and 0 none. Medication compliant. Pt compliant with group therapy x 1 on shift with encouragement. Pt denied SI/HI and AVH.  CIWA continues on shift as ordered. Appetite good on shift. Safety maintained.  11/24/22 0830  Psych Admission Type (Psych Patients Only)  Admission Status Voluntary  Psychosocial Assessment  Patient Complaints Anxiety  Eye Contact Fair  Facial Expression Animated  Affect Anxious  Speech Logical/coherent  Interaction Assertive  Motor Activity Slow  Appearance/Hygiene Unremarkable  Behavior Characteristics Cooperative  Mood Anxious  Aggressive Behavior  Effect No apparent injury  Thought Process  Coherency WDL  Content WDL  Delusions None reported or observed  Perception WDL  Hallucination None reported or observed  Judgment Impaired  Confusion None  Danger to Self  Current suicidal ideation? Denies  Agreement Not to Harm Self Yes  Description of Agreement Verbal  Danger to Others  Danger to Others None reported or observed

## 2022-11-24 NOTE — Progress Notes (Signed)
   11/24/22 2030  Psych Admission Type (Psych Patients Only)  Admission Status Voluntary  Psychosocial Assessment  Patient Complaints Anxiety  Eye Contact Fair  Facial Expression Animated  Affect Anxious  Speech Logical/coherent  Interaction Assertive  Motor Activity Slow  Appearance/Hygiene Unremarkable  Behavior Characteristics Cooperative  Mood Anxious  Aggressive Behavior  Effect No apparent injury  Thought Process  Coherency WDL  Content WDL  Delusions None reported or observed  Perception WDL  Hallucination None reported or observed  Judgment Impaired  Danger to Self  Current suicidal ideation? Denies

## 2022-11-24 NOTE — Plan of Care (Signed)
  Problem: Education: Goal: Knowledge of [facility] Education information/materials will improve Outcome: Progressing Goal: Emotional status will improve Outcome: Progressing Goal: Mental status will improve Outcome: Progressing Goal: Verbalization of understanding the information provided will improve Outcome: Progressing   Problem: Activity: Goal: Interest or engagement in activities will improve Outcome: Progressing Goal: Sleeping patterns will improve Outcome: Progressing   Problem: Safety: Goal: Periods of time without injury will increase Outcome: Progressing   Problem: Education: Goal: Utilization of techniques to improve thought processes will improve Outcome: Progressing Goal: Knowledge of the prescribed therapeutic regimen will improve Outcome: Progressing

## 2022-11-24 NOTE — B[facility] Group Notes (Signed)
   Date:  11/24/2022 Time:  12:18 PM  Group Topic/Focus:  Emotional Education:   The focus of this group is to discuss what feelings/emotions are, and how they are experienced. Goals Group:   The focus of this group is to help patients establish daily goals to achieve during treatment and discuss how the patient can incorporate goal setting into their daily lives to aide in recovery. Orientation:   The focus of this group is to educate the patient on the purpose and policies of crisis stabilization and provide a format to answer questions about their admission.  The group details unit policies and expectations of patients while admitted.  Participation Level:  Did Not Attend  Additional Comments:  Did not attend  Lucilla Edin 11/24/2022, 12:18 PM

## 2022-11-24 NOTE — Progress Notes (Signed)
[facility] MD Progress Note  11/24/2022 2:19 PM Michael Michael [name]  MRN:  [id number]  Subjective:   Michael Michael [name], 47 y.o. Caucasian male with prior psychiatric history significant for MDD, homelessness, GAD and panic attacks, who presents voluntarily to [facility] from [facility] health ED at [facility] for worsening chronic depression with suicidal ideation without specific plan in the context of homelessness and frequent panic attacks.  After medical evaluation/stabilization & clearance, he was transferred to the [facility] for further psychiatric evaluation & treatments.     On assessment today, the pt reports that their mood is more euthymic, improved since admission, and stable. Denies feeling down, depressed, or sad.  Reports that anxiety symptoms are still elevated but less compared to yesterday.  Sleep is better with remeron.  Appetite is stable.  Concentration is without complaint.  Energy Michael [name] is adequate. Denies having any suicidal thoughts. Denies having any suicidal intent and plan.  Denies having any HI.  Denies having psychotic symptoms.   Denies having side effects to current psychiatric medications.   Discussed discharge planning to care of friend, tomorrow.       Principal Problem: Major depressive disorder, recurrent severe without psychotic features (HCC) Diagnosis: Principal Problem:   Major depressive disorder, recurrent severe without psychotic features (HCC) Active Problems:   GAD (generalized anxiety disorder)   Homelessness   Panic disorder   Severe benzodiazepine use disorder (HCC)  Total Time spent with patient: 20 minutes  Past Psychiatric History:  Previous Psych Diagnoses: Chronic MDD, GAD, panic attack Prior inpatient treatment: Yes at age 61 years old, due to severe panic attack Current/prior outpatient treatment: Seen by a psychiatrist at [facility] Prior rehab hx: Denies Psychotherapy hx: As History of suicide: Yes, history of drug  overdose in 2023 History of homicide or aggression: Denies Psychiatric medication history: Yes, patient has been on a trial of trazodone, Pristiq, Zyprexa, Adderall, BuSpar, and Xanax. Psychiatric medication compliance history: Noncompliance due to affordability Neuromodulation history: Denies Current Psychiatrist yes at [facility] Current therapist: Denies  Past Medical History:  Past Medical History:  Diagnosis Date   Anxiety    TGA (transient global amnesia)    TIA (transient ischemic attack)    History reviewed. No pertinent surgical history. Family History: History reviewed. No pertinent family history. Family Psychiatric  History: See H&P   Social History:  Social History   Substance and Sexual Activity  Alcohol Use Not Currently   Comment: rarely     Social History   Substance and Sexual Activity  Drug Use No    Social History   Socioeconomic History   Marital status: Single    Spouse name: Not on file   Number of children: Not on file   Years of education: Not on file   Highest education Michael [name]: Not on file  Occupational History   Not on file  Tobacco Use   Smoking status: Some Days    Current packs/day: 0.50    Average packs/day: 0.5 packs/day for 20.0 years (10.0 ttl pk-yrs)    Types: Cigarettes   Smokeless tobacco: Not on file  Vaping Use   Vaping status: Every Day   Substances: Nicotine, Flavoring  Substance and Sexual Activity   Alcohol use: Not Currently    Comment: rarely   Drug use: No   Sexual activity: Yes    Birth control/protection: None  Other Topics Concern   Not on file  Social History Narrative   Not on file  Social Determinants of Health   Financial Resource Strain: Not on file  Food Insecurity: Food Insecurity Present (11/15/2022)   Hunger Vital Sign    Worried About Running Out of Food in the Last Year: Often true    Ran Out of Food in the Last Year: Often true  Transportation Needs: Unmet Transportation Needs  (11/15/2022)   PRAPARE - Administrator, Civil Service (Medical): Yes    Lack of Transportation (Non-Medical): Yes  Physical Activity: Not on file  Stress: Not on file  Social Connections: Unknown (09/01/2022)   Received from [employer]   Social Network    Social Network: Not on file   Additional Social History:                           Current Medications: Current Facility-Administered Medications  Medication Dose Route Frequency Provider Last Rate Last Admin   alum & mag hydroxide-simeth (MAALOX/MYLANTA) 200-200-20 MG/5ML suspension 30 mL  30 mL Oral Q4H PRN Dahlia Byes C, NP       busPIRone (BUSPAR) tablet 10 mg  10 mg Oral BID Cecilie Lowers, FNP   10 mg at 11/24/22 0831   diphenhydrAMINE (BENADRYL) capsule 50 mg  50 mg Oral TID PRN Earney Navy, NP       Or   diphenhydrAMINE (BENADRYL) injection 50 mg  50 mg Intramuscular TID PRN Dahlia Byes C, NP       docusate sodium (COLACE) capsule 100 mg  100 mg Oral BID Dow Blahnik, Harrold Donath, MD   100 mg at 11/24/22 1478   haloperidol (HALDOL) tablet 5 mg  5 mg Oral TID PRN Dahlia Byes C, NP       Or   haloperidol lactate (HALDOL) injection 5 mg  5 mg Intramuscular TID PRN Dahlia Byes C, NP       hydrOXYzine (ATARAX) tablet 25 mg  25 mg Oral TID PRN Roselle Locus, MD   25 mg at 11/24/22 2956   ibuprofen (ADVIL) tablet 600 mg  600 mg Oral Q6H PRN Roselle Locus, MD   600 mg at 11/22/22 1430   mirtazapine (REMERON) tablet 15 mg  15 mg Oral QHS Neelam Tiggs, Harrold Donath, MD   15 mg at 11/23/22 2137   multivitamin with minerals tablet 1 tablet  1 tablet Oral Daily Roselle Locus, MD   1 tablet at 11/24/22 0831   nicotine (NICODERM CQ - dosed in mg/24 hours) patch 21 mg  21 mg Transdermal Daily Attiah, Nadir, MD   21 mg at 11/24/22 2130   nicotine polacrilex (NICORETTE) gum 2 mg  2 mg Oral PRN Brinae Woods, Harrold Donath, MD   2 mg at 11/20/22 2101   polyethylene glycol (MIRALAX /  GLYCOLAX) packet 17 g  17 g Oral Daily PRN Duayne Brideau, Harrold Donath, MD       traZODone (DESYREL) tablet 50 mg  50 mg Oral QHS PRN Jearld Lesch, NP   50 mg at 11/23/22 2137   venlafaxine XR (EFFEXOR-XR) 24 hr capsule 150 mg  150 mg Oral Q breakfast Roselle Locus, MD   150 mg at 11/24/22 0831    Lab Results: No results found for this or any previous visit (from the past 48 hour(s)).  Blood Alcohol Michael [name]:  Lab Results  Component Value Date   ETH <10 11/14/2022   ETH <10 11/20/2021    Metabolic Disorder Labs: No results found for: "HGBA1C", "MPG" No results found for: "  PROLACTIN" No results found for: "CHOL", "TRIG", "HDL", "CHOLHDL", "VLDL", "LDLCALC"  Physical Findings: AIMS:  , ,  ,  ,    CIWA:  CIWA-Ar Total: 2 COWS:       Psychiatric Specialty Exam:  Presentation  General Appearance:  Appropriate for Environment; Casual; Fairly Groomed  Eye Contact: Good  Speech: Normal Rate; Clear and Coherent  Speech Volume: Normal  Handedness: Right   Mood and Affect  Mood: Less anxious, euthymic   Affect: Appropriate; Congruent; Full Range   Thought Process  Thought Processes: Linear  Descriptions of Associations:Intact  Orientation:Full (Time, Place and Person)  Thought Content:Logical  History of Schizophrenia/Schizoaffective disorder:No data recorded Duration of Psychotic Symptoms:No data recorded Hallucinations:Hallucinations: None  Ideas of Reference:None  Suicidal Thoughts:Suicidal Thoughts: No  Homicidal Thoughts:Homicidal Thoughts: No   Sensorium  Memory: Immediate Good; Recent Good; Remote Good  Judgment: Good  Insight: Good   Executive Functions  Concentration: Good  Attention Span: Good  Recall: Good  Fund of Knowledge: Good  Language: Good   Psychomotor Activity  Psychomotor Activity: Psychomotor Activity: Normal  nml  Assets  Assets: Communication Skills; Desire for Improvement   Sleep   Sleep: Sleep: Fair    Physical Exam: Physical Exam Vitals reviewed.  Constitutional:      Appearance: He is normal weight.  Pulmonary:     Effort: Pulmonary effort is normal. No respiratory distress.  Neurological:     Mental Status: He is alert.     Motor: No weakness.     Gait: Gait normal.  Psychiatric:        Mood and Affect: Mood is anxious.        Behavior: Behavior normal.        Thought Content: Thought content normal.        Judgment: Judgment normal.    Review of Systems  Constitutional:  Negative for chills and fever.  Cardiovascular:  Negative for chest pain and palpitations.  Neurological:  Negative for dizziness, tingling, tremors and headaches.  Psychiatric/Behavioral:  Positive for memory loss and substance abuse. Negative for depression, hallucinations and suicidal ideas. The patient is nervous/anxious and has insomnia.   All other systems reviewed and are negative.  Blood pressure 110/88, pulse 96, temperature 99 F (37.2 C), temperature source Oral, resp. rate 20, height 5\' 11"  (1.803 m), weight 71.7 kg, SpO2 100%. Body mass index is 22.04 kg/m.   Treatment Plan Summary: Daily contact with patient to assess and evaluate symptoms and progress in treatment and Medication management  ASSESSMENT:  Diagnoses / Active Problems: MDD severe recurrent without psychosis Benzodiazepine use disorder GAD Panic disorder Homelessness    PLAN: Safety and Monitoring:  --  Voluntary admission to inpatient psychiatric unit for safety, stabilization and treatment  -- Daily contact with patient to assess and evaluate symptoms and progress in treatment  -- Patient's case to be discussed in multi-disciplinary team meeting  -- Observation Michael [name] : q15 minute checks  -- Vital signs:  q12 hours  -- Precautions: suicide, elopement, and assault  2. Psychiatric Diagnoses and Treatment:     Continue Effexor 150 mg once daily Continue BuSpar 10 gm tid for anxiety   Continue Remeron 15 mg nightly for MDD, GAD, panic disorder, and insomnia  Already completed Ativan taper for acute benzodiazepine withdrawal  --  The risks/benefits/side-effects/alternatives to this medication were discussed in detail with the patient and time was given for questions. The patient consents to medication trial.    -- Metabolic profile and  EKG monitoring obtained while on an atypical antipsychotic (BMI: Lipid Panel: HbgA1c: QTc:)   -- Encouraged patient to participate in unit milieu and in scheduled group therapies       3. Medical Issues Being Addressed:   Tobacco Use Disorder  -- Nicotine patch 21mg /24 hours ordered  -- Smoking cessation encouraged  4. Discharge Planning:   -- Social work and case management to assist with discharge planning and identification of hospital follow-up needs prior to discharge  -- Estimated LOS: plan for tomorrow 10-17   -- Discharge Concerns: Need to establish a safety plan; Medication compliance and effectiveness  -- Discharge Goals: Return home with outpatient referrals for mental health follow-up including medication management/psychotherapy   Cristy Hilts, MD 11/24/2022, 2:19 PM  Total Time Spent in Direct Patient Care:  I personally spent 25 minutes on the unit in direct patient care. The direct patient care time included face-to-face time with the patient, reviewing the patient's chart, communicating with other professionals, and coordinating care. Greater than 50% of this time was spent in counseling or coordinating care with the patient regarding goals of hospitalization, psycho-education, and discharge planning needs.   Phineas Inches, MD Psychiatrist

## 2022-11-24 NOTE — B[facility] Group Notes (Signed)
Adult Psychoeducational Group Note  Date:  11/24/2022 Time:  9:38 PM  Group Topic/Focus:  Wrap-Up Group:   The focus of this group is to help patients review their daily goal of treatment and discuss progress on daily workbooks.  Participation Level:  Active  Participation Quality:  Attentive  Affect:  Appropriate  Cognitive:  Alert  Insight: Improving  Engagement in Group:  Engaged  Modes of Intervention:  Discussion  Additional Comments:  Pt attended and participated in group.  Maura Crandall Cassandra 11/24/2022, 9:38 PM

## 2022-11-25 DIAGNOSIS — F332 Major depressive disorder, recurrent severe without psychotic features: Secondary | ICD-10-CM | POA: Diagnosis not present

## 2022-11-25 MED ORDER — BUSPIRONE HCL 10 MG PO TABS: 10.0000 mg | ORAL_TABLET | Freq: Two times a day (BID) | ORAL | 0 refills | Status: AC

## 2022-11-25 MED ORDER — DOCUSATE SODIUM 100 MG PO CAPS: 100.0000 mg | ORAL_CAPSULE | Freq: Every day | ORAL | 0 refills | Status: DC | PRN

## 2022-11-25 MED ORDER — NICOTINE 21 MG/24HR TD PT24: 21.0000 mg | MEDICATED_PATCH | Freq: Every day | TRANSDERMAL | 0 refills | Status: DC

## 2022-11-25 MED ORDER — TRAZODONE HCL 50 MG PO TABS: 50.0000 mg | ORAL_TABLET | Freq: Every evening | ORAL | 0 refills | Status: DC | PRN

## 2022-11-25 MED ORDER — HYDROXYZINE HCL 25 MG PO TABS: 25.0000 mg | ORAL_TABLET | Freq: Three times a day (TID) | ORAL | 0 refills | Status: DC | PRN

## 2022-11-25 MED ORDER — MIRTAZAPINE 15 MG PO TABS: 15.0000 mg | ORAL_TABLET | Freq: Every day | ORAL | 0 refills | Status: DC

## 2022-11-25 MED ORDER — VENLAFAXINE HCL ER 150 MG PO CP24: 150.0000 mg | ORAL_CAPSULE | Freq: Every day | ORAL | 0 refills | Status: DC

## 2022-11-25 NOTE — Progress Notes (Signed)
  [facility] Adult Case Management Discharge Plan :  Will you be returning to the same living situation after discharge:  Yes,  Pt will be returning to the [facility]  At discharge, do you have transportation home?: Yes,  A bus pass will be provided to the Pt  Do you have the ability to pay for your medications: Yes,  BCBS  Release of information consent forms completed and in the chart;  Patient's signature needed at discharge.  Patient to Follow up at:  Follow-up Information     [facility]. Go on 12/07/2022.   Specialty: Behavioral Health Why: You have an appointment for medication management services on 12/07/22 at 2:00 pm, in person.  You also have an appointment for therapy services on 02/08/23 at 1:00 pm, in person.  * YOU MUST CONFIRM APPTS OR THEY WILL BE CANCELLED.  For fastest service, please go Monday through Friday, arrive by 7:00 am for same day service. Contact information: [address] [place] [address] [phone]        Markham Jordan, PA-C .   Specialty: Family Medicine                Next level of care provider has access to [facility] Link:no  Safety Planning and Suicide Prevention discussed: No.     Has patient been referred to the Quitline?: Patient refused referral for treatment  Patient has been referred for addiction treatment: Patient refused referral for treatment.  Izell [place], LCSW 11/25/2022, 9:22 AM

## 2022-11-25 NOTE — Group Note (Signed)
[facility] LCSW Group Therapy Note   Group Date: 11/25/2022 Start Time: 1100 End Time: 1145  Type of Therapy/Topic:  Group Therapy:  Feelings about Diagnosis  Participation Level:  Did Not Attend      Description of Group:    This group will allow patients to explore their thoughts and feelings about diagnoses they have received. Patients will be guided to explore their level of understanding and acceptance of these diagnoses. Facilitator will encourage patients to process their thoughts and feelings about the reactions of others to their diagnosis, and will guide patients in identifying ways to discuss their diagnosis with significant others in their lives. This group will be process-oriented, with patients participating in exploration of their own experiences as well as giving and receiving support and challenge from other group members.   Therapeutic Goals: 1. Patient will demonstrate understanding of diagnosis as evidence by identifying two or more symptoms of the disorder:  2. Patient will be able to express two feelings regarding the diagnosis 3. Patient will demonstrate ability to communicate their needs through discussion and/or role plays      Therapeutic Modalities:   Cognitive Behavioral Therapy Brief Therapy Feelings Identification    Kynedi Profitt S Latoia Eyster, LCSW

## 2022-11-25 NOTE — Plan of Care (Signed)
  Problem: Education: Goal: Knowledge of [facility] information/materials will improve Outcome: Progressing Goal: Emotional status will improve Outcome: Progressing Goal: Mental status will improve Outcome: Progressing Goal: Verbalization of understanding the information provided will improve Outcome: Progressing   Problem: Activity: Goal: Interest or engagement in activities will improve Outcome: Progressing Goal: Sleeping patterns will improve Outcome: Progressing   Problem: Coping: Goal: Ability to verbalize frustrations and anger appropriately will improve Outcome: Progressing Goal: Ability to demonstrate self-control will improve Outcome: Progressing   Problem: Physical Regulation: Goal: Ability to maintain clinical measurements within normal limits will improve Outcome: Progressing   Problem: Safety: Goal: Periods of time without injury will increase Outcome: Progressing   Problem: Activity: Goal: Interest or engagement in leisure activities will improve Outcome: Progressing Goal: Imbalance in normal sleep/wake cycle will improve Outcome: Progressing   Problem: Health Behavior/Discharge Planning: Goal: Ability to make decisions will improve Outcome: Progressing Goal: Compliance with therapeutic regimen will improve Outcome: Progressing
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
## 2022-11-25 NOTE — Progress Notes (Signed)
Patient discharged to home via public transportation. Discharge instructions, prescriptions, all required discharge documents and information about follow-up appointment given to pt with verbalization of understanding. All personal belongings given to pt at time of discharge. Plan of Care resolved. Pt denied SI/HI and AVH at time of discharge. Pt escorted to lobby by [facility] at 1245.  11/25/22 0805  Psych Admission Type (Psych Patients Only)  Admission Status Voluntary  Psychosocial Assessment  Patient Complaints Anxiety  Eye Contact Fair  Facial Expression Animated  Affect Anxious  Speech Logical/coherent  Interaction Assertive  Motor Activity Other (Comment) (wnl)  Appearance/Hygiene Unremarkable  Behavior Characteristics Cooperative  Mood Anxious  Aggressive Behavior  Effect No apparent injury  Thought Process  Coherency WDL  Content WDL  Delusions None reported or observed  Perception WDL  Hallucination None reported or observed  Judgment Impaired  Confusion None  Danger to Self  Current suicidal ideation? Denies  Agreement Not to Harm Self Yes  Description of Agreement Verbal  Danger to Others  Danger to Others None reported or observed

## 2022-11-25 NOTE — [facility] Group Notes (Signed)
Pt did not attend goals group. 

## 2022-11-25 NOTE — Discharge Summary (Signed)
Physician Discharge Summary Note  Patient:  Michael [name] is an 47 y.o., male MRN:  [id number] DOB:  08-15-1975 Patient phone:  [phone] (home)  Patient address:   [place] [address],  Total Time spent with patient: 30 minutes  Date of Admission:  11/15/2022 Date of Discharge: 11-25-2022  Reason for Admission:    Michael [name], 47 y.o. Caucasian male with prior psychiatric history significant for MDD, homelessness, GAD and panic attacks, who presents voluntarily to [facility] from [facility] health ED at [facility] for worsening chronic depression with suicidal ideation without specific plan in the context of homelessness and frequent panic attacks.   Principal Problem: Major depressive disorder, recurrent severe without psychotic features [facility]) Discharge Diagnoses: Principal Problem:   Major depressive disorder, recurrent severe without psychotic features (HCC) Active Problems:   GAD (generalized anxiety disorder)   Panic disorder   Severe benzodiazepine use disorder (HCC)   Past Psychiatric History:  Previous Psych Diagnoses: Chronic MDD, GAD, panic attack Prior inpatient treatment: Yes at age 61 years old, due to severe panic attack Current/prior outpatient treatment: Seen by a psychiatrist at [facility] Prior rehab hx: Denies Psychotherapy hx: As History of suicide: Yes, history of drug overdose in 2023 History of homicide or aggression: Denies Psychiatric medication history: Yes, patient has been on a trial of trazodone, Pristiq, Zyprexa, Adderall, BuSpar, and Xanax. Psychiatric medication compliance history: Noncompliance due to affordability Neuromodulation history: Denies Current Psychiatrist yes at [facility] Current therapist: Denies   Past Medical History:  Past Medical History:  Diagnosis Date   Anxiety    TGA (transient global amnesia)    TIA (transient ischemic attack)    History reviewed. No pertinent surgical  history. Family History: History reviewed. No pertinent family history. Family Psychiatric  History: See H&P  Social History:  Social History   Substance and Sexual Activity  Alcohol Use Not Currently   Comment: rarely     Social History   Substance and Sexual Activity  Drug Use No    Social History   Socioeconomic History   Marital status: Single    Spouse name: Not on file   Number of children: Not on file   Years of education: Not on file   Highest education level: Not on file  Occupational History   Not on file  Tobacco Use   Smoking status: Some Days    Current packs/day: 0.50    Average packs/day: 0.5 packs/day for 20.0 years (10.0 ttl pk-yrs)    Types: Cigarettes   Smokeless tobacco: Not on file  Vaping Use   Vaping status: Every Day   Substances: Nicotine, Flavoring  Substance and Sexual Activity   Alcohol use: Not Currently    Comment: rarely   Drug use: No   Sexual activity: Yes    Birth control/protection: None  Other Topics Concern   Not on file  Social History Narrative   Not on file   Social Determinants of Health   Financial Resource Strain: Not on file  Food Insecurity: Food Insecurity Present (11/15/2022)   Hunger Vital Sign    Worried About Running Out of Food in the Last Year: Often true    Ran Out of Food in the Last Year: Often true  Transportation Needs: Unmet Transportation Needs (11/15/2022)   PRAPARE - Administrator, Civil Service (Medical): Yes    Lack of Transportation (Non-Medical): Yes  Physical Activity: Not on file  Stress: Not on  file  Social Connections: Unknown (09/01/2022)   Received from [facility]   Social Network    Social Network: Not on file    [facility] Course:   During the patient's hospitalization, patient had extensive initial psychiatric evaluation, and follow-up psychiatric evaluations every day.  Psychiatric diagnoses provided upon initial assessment:  MDD GAD Panic disorder Benzodiazepine  dependence   Patient's psychiatric medications were adjusted on admission:  Stop xanax Start ativan taper for xanax dependence Initiate Effexor XR 75 mg mg p.o. daily for depression Initiated BuSpar 10 mg p.o. 2 times daily for anxiety  During the hospitalization, other adjustments were made to the patient's psychiatric medication regimen:  Effexor was incr to 150 mg every day  Remeron was started for insomnia - very effective   Patient's care was discussed during the interdisciplinary team meeting every day during the hospitalization.  The patient denied having side effects to prescribed psychiatric medication.  Gradually, patient started adjusting to milieu. The patient was evaluated each day by a clinical provider to ascertain response to treatment. Improvement was noted by the patient's report of decreasing symptoms, improved sleep and appetite, affect, medication tolerance, behavior, and participation in unit programming.  Patient was asked each day to complete a self inventory noting mood, mental status, pain, new symptoms, anxiety and concerns.    Symptoms were reported as significantly decreased or resolved completely by discharge.   On day of discharge, the patient reports that their mood is stable. The patient denied having suicidal thoughts for more than 48 hours prior to discharge.  Patient denies having homicidal thoughts.  Patient denies having auditory hallucinations.  Patient denies any visual hallucinations or other symptoms of psychosis. The patient was motivated to continue taking medication with a goal of continued improvement in mental health.   The patient reports their target psychiatric symptoms of anxiety, panic attacks, depression, insomnia, and suicidal thoughts, all responded well to the psychiatric medications, and the patient reports overall benefit other psychiatric hospitalization. Supportive psychotherapy was provided to the patient. The patient also  participated in regular group therapy while hospitalized. Coping skills, problem solving as well as relaxation therapies were also part of the unit programming.  Labs were reviewed with the patient, and abnormal results were discussed with the patient.  The patient is able to verbalize their individual safety plan to this provider.  # It is recommended to the patient to continue psychiatric medications as prescribed, after discharge from the hospital.    # It is recommended to the patient to follow up with your outpatient psychiatric provider and PCP.  # It was discussed with the patient, the impact of alcohol, drugs, tobacco have been there overall psychiatric and medical wellbeing, and total abstinence from substance use was recommended the patient.ed.  # Prescriptions provided or sent directly to preferred pharmacy at discharge. Patient agreeable to plan. Given opportunity to ask questions. Appears to feel comfortable with discharge.    # In the event of worsening symptoms, the patient is instructed to call the crisis hotline, 911 and or go to the nearest ED for appropriate evaluation and treatment of symptoms. To follow-up with primary care provider for other medical issues, concerns and or health care needs  # Patient was discharged to friend's house, with a plan to follow up as noted below.   Physical Findings: AIMS:  , ,  ,  ,    CIWA:  CIWA-Ar Total: 3 COWS:     Musculoskeletal: Strength & Muscle Tone: within  normal limits Gait & Station: normal Patient leans: N/A   Psychiatric Specialty Exam:  Presentation  General Appearance:  Appropriate for Environment; Casual; Fairly Groomed  Eye Contact: Good  Speech: Normal Rate; Clear and Coherent  Speech Volume: Normal  Handedness: Right   Mood and Affect  Mood: Euthymic; Anxious  Affect: Appropriate; Congruent; Full Range   Thought Process  Thought Processes: Linear  Descriptions of  Associations:Intact  Orientation:Full (Time, Place and Person)  Thought Content:Logical  History of Schizophrenia/Schizoaffective disorder:No data recorded Duration of Psychotic Symptoms:No data recorded Hallucinations:Hallucinations: None  Ideas of Reference:None  Suicidal Thoughts:Suicidal Thoughts: No  Homicidal Thoughts:Homicidal Thoughts: No   Sensorium  Memory: Immediate Good; Recent Good; Remote Good  Judgment: Fair  Insight: Fair   Art therapist  Concentration: Fair  Attention Span: Fair  Recall: Good  Fund of Knowledge: Good  Language: Good   Psychomotor Activity  Psychomotor Activity: Psychomotor Activity: Normal   Assets  Assets: Communication Skills; Desire for Improvement   Sleep  Sleep: Sleep: Fair    Physical Exam: Physical Exam Vitals reviewed.  Constitutional:      General: He is not in acute distress.    Appearance: He is normal weight. He is not toxic-appearing.  Pulmonary:     Effort: Pulmonary effort is normal. No respiratory distress.  Neurological:     Mental Status: He is alert.     Motor: No weakness.     Gait: Gait normal.  Psychiatric:        Mood and Affect: Mood normal.        Behavior: Behavior normal.        Thought Content: Thought content normal.        Judgment: Judgment normal.    Review of Systems  Constitutional:  Negative for chills and fever.  Cardiovascular:  Negative for chest pain and palpitations.  Neurological:  Negative for dizziness, tingling, tremors and headaches.  Psychiatric/Behavioral:  Negative for depression, hallucinations, memory loss, substance abuse and suicidal ideas. The patient is nervous/anxious. The patient does not have insomnia.   All other systems reviewed and are negative.  Blood pressure 115/84, pulse (!) 106, temperature 98.8 F (37.1 C), temperature source Oral, resp. rate 12, height 5\' 11"  (1.803 m), weight 71.7 kg, SpO2 100%. Body mass index is 22.04  kg/m.   Social History   Tobacco Use  Smoking Status Some Days   Current packs/day: 0.50   Average packs/day: 0.5 packs/day for 20.0 years (10.0 ttl pk-yrs)   Types: Cigarettes  Smokeless Tobacco Not on file   Tobacco Cessation:  A prescription for an FDA-approved tobacco cessation medication provided at discharge   Blood Alcohol level:  Lab Results  Component Value Date   ETH <10 11/14/2022   ETH <10 11/20/2021    Metabolic Disorder Labs:  No results found for: "HGBA1C", "MPG" No results found for: "PROLACTIN" No results found for: "CHOL", "TRIG", "HDL", "CHOLHDL", "VLDL", "LDLCALC"  See Psychiatric Specialty Exam and Suicide Risk Assessment completed by Attending Physician prior to discharge.  Discharge destination:  Other:  friend's house  Is patient on multiple antipsychotic therapies at discharge:  No   Has Patient had three or more failed trials of antipsychotic monotherapy by history:  No  Recommended Plan for Multiple Antipsychotic Therapies: NA  Discharge Instructions     Diet - low sodium heart healthy   Complete by: As directed    Increase activity slowly   Complete by: As directed  Allergies as of 11/25/2022       Reactions   Melatonin Other (See Comments)   Did not help the patient achieve drowsiness and made the legs "jerk"    Neurontin [gabapentin] Other (See Comments)   Made the patient feel "spacey"   Trazodone And Nefazodone Other (See Comments)   Too narrow of a window of time to fall asleep and caused excessive hunger   Tylenol Pm Extra Strength [diphenhydramine-apap (sleep)] Other (See Comments)   Did not help the patient achieve drowsiness and made the legs "jerk"         Medication List     STOP taking these medications    acetaminophen 500 MG tablet Commonly known as: TYLENOL   ALPRAZolam 1 MG tablet Commonly known as: XANAX   amoxicillin-clavulanate 875-125 MG tablet Commonly known as: AUGMENTIN    amphetamine-dextroamphetamine 20 MG tablet Commonly known as: ADDERALL   aspirin 81 MG chewable tablet   desvenlafaxine 100 MG 24 hr tablet Commonly known as: PRISTIQ   oxyCODONE 5 MG immediate release tablet Commonly known as: Oxy IR/ROXICODONE   propranolol 20 MG tablet Commonly known as: INDERAL       TAKE these medications      Indication  busPIRone 10 MG tablet Commonly known as: BUSPAR Take 1 tablet (10 mg total) by mouth 2 (two) times daily.  Indication: Anxiety Disorder, Major Depressive Disorder   Descovy 200-25 MG tablet Generic drug: emtricitabine-tenofovir AF Take 1 tablet by mouth daily.  Indication: home med   docusate sodium 100 MG capsule Commonly known as: COLACE Take 1 capsule (100 mg total) by mouth daily as needed for mild constipation.  Indication: Constipation   doxycycline 100 MG tablet Commonly known as: ADOXA Take 200 mg by mouth See admin instructions. "Take 200 mg (2 tablets) by mouth up to 72 hours after intercourse. Do not exceed more than 200 mg (two tablets) in 24 hrs."  Indication: doxypep   hydrOXYzine 25 MG tablet Commonly known as: ATARAX Take 1 tablet (25 mg total) by mouth 3 (three) times daily as needed for anxiety.  Indication: Feeling Anxious   mirtazapine 15 MG tablet Commonly known as: REMERON Take 1 tablet (15 mg total) by mouth at bedtime.  Indication: Major Depressive Disorder, Panic Disorder   nicotine 21 mg/24hr patch Commonly known as: NICODERM CQ - dosed in mg/24 hours Place 1 patch (21 mg total) onto the skin daily. Start taking on: November 26, 2022  Indication: Nicotine Addiction   traZODone 50 MG tablet Commonly known as: DESYREL Take 1 tablet (50 mg total) by mouth at bedtime as needed for sleep (Pt states that he is not allergic).  Indication: Trouble Sleeping, Major Depressive Disorder   venlafaxine XR 150 MG 24 hr capsule Commonly known as: EFFEXOR-XR Take 1 capsule (150 mg total) by mouth daily  with breakfast. Start taking on: November 26, 2022  Indication: Major Depressive Disorder   Visine Dry Eye Relief 1 % Soln Generic drug: Polyethylene Glycol 400 Apply 1 drop to eye 2 (two) times daily as needed (for irritation).  Indication: Excessive Cornea and Conjunctiva Dryness        Follow-up Information     [facility]. Go on 12/07/2022.   Specialty: Behavioral Health Why: You have an appointment for medication management services on 12/07/22 at 2:00 pm, in person.  You also have an appointment for therapy services on 02/08/23 at 1:00 pm, in person.  * YOU MUST CONFIRM APPTS OR  THEY WILL BE CANCELLED.  For fastest service, please go Monday through Friday, arrive by 7:00 am for same day service. Contact information: [address] [place] [address] [phone]        Markham Jordan, PA-C .   Specialty: Family Medicine                Follow-up recommendations:    Activity: as tolerated  Diet: heart healthy  Other: -Follow-up with your outpatient psychiatric provider -instructions on appointment date, time, and address (location) are provided to you in discharge paperwork.  -Take your psychiatric medications as prescribed at discharge - instructions are provided to you in the discharge paperwork  -Follow-up with outpatient primary care doctor and other specialists -for management of preventative medicine and chronic medical disease  -If you are prescribed an atypical antipsychotic medication, we recommend that your outpatient psychiatrist follow routine screening for side effects within 3 months of discharge, including monitoring: AIMS scale, height, weight, blood pressure, fasting lipid panel, HbA1c, and fasting blood sugar.   -Recommend total abstinence from alcohol, tobacco, and other illicit drug use at discharge.   -If your psychiatric symptoms recur, worsen, or if you have side effects to your psychiatric  medications, call your outpatient psychiatric provider, 911, [phone] or go to the nearest emergency department.  -If suicidal thoughts occur, immediately call your outpatient psychiatric provider, 911, [phone] or go to the nearest emergency department.   Signed: Cristy Hilts, MD 11/25/2022, 9:44 AM   Total Time Spent in Direct Patient Care:  I personally spent 35 minutes on the unit in direct patient care. The direct patient care time included face-to-face time with the patient, reviewing the patient's chart, communicating with other professionals, and coordinating care. Greater than 50% of this time was spent in counseling or coordinating care with the patient regarding goals of hospitalization, psycho-education, and discharge planning needs.   Phineas Inches, MD Psychiatrist

## 2022-12-07 ENCOUNTER — Ambulatory Visit (HOSPITAL_COMMUNITY): Payer: BLUE CROSS/BLUE SHIELD | Admitting: Psychiatry

## 2023-02-08 ENCOUNTER — Ambulatory Visit (HOSPITAL_COMMUNITY): Payer: BLUE CROSS/BLUE SHIELD | Admitting: Licensed Clinical Social Worker

## 2023-03-05 ENCOUNTER — Emergency Department (HOSPITAL_COMMUNITY)
Admission: EM | Admit: 2023-03-05 | Discharge: 2023-03-05 | Disposition: A | Discharge status: Home / Self Care | Payer: BLUE CROSS/BLUE SHIELD | Attending: Emergency Medicine | Admitting: Emergency Medicine

## 2023-03-05 ENCOUNTER — Other Ambulatory Visit: Payer: Self-pay

## 2023-03-05 ENCOUNTER — Encounter (HOSPITAL_COMMUNITY): Payer: Self-pay

## 2023-03-05 DIAGNOSIS — F410 Panic disorder [episodic paroxysmal anxiety] without agoraphobia: Secondary | ICD-10-CM | POA: Diagnosis not present

## 2023-03-05 DIAGNOSIS — F419 Anxiety disorder, unspecified: Secondary | ICD-10-CM | POA: Diagnosis present

## 2023-03-05 MED ORDER — LORAZEPAM 1 MG PO TABS
1.0000 mg | ORAL_TABLET | Freq: Once | ORAL | Status: AC
  Administered 2023-03-05: 1 mg via ORAL
  Filled 2023-03-05: qty 1

## 2023-03-05 MED ORDER — ALPRAZOLAM 0.5 MG PO TABS
1.0000 mg | ORAL_TABLET | Freq: Once | ORAL | Status: AC
  Administered 2023-03-05: 1 mg via ORAL
  Filled 2023-03-05: qty 2

## 2023-03-05 MED ORDER — ALPRAZOLAM 1 MG PO TABS
1.0000 mg | ORAL_TABLET | Freq: Two times a day (BID) | ORAL | 0 refills | Status: DC | PRN
Start: 2023-03-05 — End: 2023-03-20

## 2023-03-05 NOTE — Discharge Instructions (Signed)
Take Xanax as needed for panic attacks as we discussed.  Do not mix with alcohol drugs or dangerous activities including driving.  Follow-up with primary care doctor, mental health doctors.

## 2023-03-05 NOTE — ED Notes (Signed)
Sarah, PA notified of pt panic attack, requested for her to see pt and order medications

## 2023-03-05 NOTE — ED Triage Notes (Signed)
Pt reports he has panic attacks, they started getting worse yesterday. Used to take xanax, hasn't needed xanax since October. Reports no relief with vistaril.

## 2023-03-05 NOTE — ED Provider Notes (Signed)
Michael [name] EMERGENCY DEPARTMENT AT [facility] Provider Note   CSN: [id number] Arrival date & time: 03/05/23  1126     History  Chief Complaint  Patient presents with   Panic Attack    Michael [name] is a 48 y.o. male.  Patient here with anxiety panic attacks.  He had been on alprazolam in the past but has been able to wean himself off and now mostly on Vistaril but not helping.  He does not really follow with a psychiatrist anymore.  He denies any suicidal homicidal ideation.  He will have these episodes of intense fear and anxiety that usually self resolved but he has been on Xanax for a long time but wanted to get himself off of it but here recently he has been having a hard time.  He denies any weakness numbness tingling.  No chest pain shortness of breath.  Nothing makes it worse or better.  The history is provided by the patient.       Home Medications Prior to Admission medications   Medication Sig Start Date End Date Taking? Authorizing Provider  ALPRAZolam Prudy Feeler) 1 MG tablet Take 1 tablet (1 mg total) by mouth 2 (two) times daily as needed for up to 12 doses for anxiety. 03/05/23  Yes Lamon Rotundo, DO  DESCOVY 200-25 MG tablet Take 1 tablet by mouth daily.    [provider]  docusate sodium (COLACE) 100 MG capsule Take 1 capsule (100 mg total) by mouth daily as needed for mild constipation. 11/25/22   Massengill, Harrold Donath, MD  doxycycline (ADOXA) 100 MG tablet Take 200 mg by mouth See admin instructions. "Take 200 mg (2 tablets) by mouth up to 72 hours after intercourse. Do not exceed more than 200 mg (two tablets) in 24 hrs."    [provider]  hydrOXYzine (ATARAX) 25 MG tablet Take 1 tablet (25 mg total) by mouth 3 (three) times daily as needed for anxiety. 11/25/22   Massengill, Harrold Donath, MD  mirtazapine (REMERON) 15 MG tablet Take 1 tablet (15 mg total) by mouth at bedtime. 11/25/22 12/25/22  Massengill, Harrold Donath, MD  nicotine (NICODERM CQ -  DOSED IN MG/24 HOURS) 21 mg/24hr patch Place 1 patch (21 mg total) onto the skin daily. 11/26/22   Massengill, Harrold Donath, MD  traZODone (DESYREL) 50 MG tablet Take 1 tablet (50 mg total) by mouth at bedtime as needed for sleep (Pt states that he is not allergic). 11/25/22   Massengill, Harrold Donath, MD  venlafaxine XR (EFFEXOR-XR) 150 MG 24 hr capsule Take 1 capsule (150 mg total) by mouth daily with breakfast. 11/26/22 12/26/22  Massengill, Harrold Donath, MD  VISINE DRY EYE RELIEF 1 % SOLN Apply 1 drop to eye 2 (two) times daily as needed (for irritation).    [provider]      Allergies    Melatonin, Neurontin [gabapentin], Trazodone and nefazodone, and Tylenol pm extra strength [diphenhydramine-apap (sleep)]    Review of Systems   Review of Systems  Physical Exam Updated Vital Signs BP (!) 146/90   Pulse (!) 139   Temp 97.9 F (36.6 C) (Oral)   Resp 18   Ht 5\' 11"  (1.803 m)   Wt 81.6 kg   SpO2 100%   BMI 25.10 kg/m  Physical Exam Vitals and nursing note reviewed.  Constitutional:      General: He is not in acute distress.    Appearance: He is well-developed. He is not ill-appearing.  HENT:     Head: Normocephalic  and atraumatic.  Eyes:     Extraocular Movements: Extraocular movements intact.     Conjunctiva/sclera: Conjunctivae normal.     Pupils: Pupils are equal, round, and reactive to light.  Cardiovascular:     Rate and Rhythm: Normal rate and regular rhythm.     Pulses: Normal pulses.     Heart sounds: Normal heart sounds. No murmur heard. Pulmonary:     Effort: Pulmonary effort is normal. No respiratory distress.     Breath sounds: Normal breath sounds.  Abdominal:     General: Abdomen is flat.     Palpations: Abdomen is soft.     Tenderness: There is no abdominal tenderness.  Musculoskeletal:        General: No swelling.     Cervical back: Normal range of motion and neck supple.  Skin:    General: Skin is warm and dry.     Capillary Refill: Capillary refill  takes less than 2 seconds.  Neurological:     Mental Status: He is alert.  Psychiatric:        Mood and Affect: Mood normal.     Comments: No SI or HI, says he is anxious but feeling better than when he first came in.     ED Results / Procedures / Treatments   Labs (all labs ordered are listed, but only abnormal results are displayed) Labs Reviewed - No data to display  EKG None  Radiology No results found.  Procedures Procedures    Medications Ordered in ED Medications  LORazepam (ATIVAN) tablet 1 mg (1 mg Oral Given 03/05/23 1212)  ALPRAZolam Prudy Feeler) tablet 1 mg (1 mg Oral Given 03/05/23 1329)    ED Course/ Medical Decision Making/ A&P                                 Medical Decision Making Risk Prescription drug management.   Michael [name] is here panic attacks.  History of the same.  Used to be on Xanax but was able to get himself off of it takes Vistaril as needed but has not helped.  He has been having some episodes of anxiety here recently.  A bad panic attack type event today.  He is feeling better since Ativan that he got in triage.  Still little bit anxious we will give him a dose of Xanax to get him back on his normal medication that he used to have success with in the past.  Will give him a handful of this medication for outpatient use as well.  He is not having any suicidal homicidal ideation.  He is feeling much better.  EKG shows sinus rhythm.  No ischemic changes.  Have no concern for cardiac pulmonary or neurologic process otherwise.  He says that he thought being on medications was sign of weakness and has not really like to be on any medications including maintenance medications.  Ultimately I think he would benefit from being back on some sort of maintenance anxiety med and will refer him to outpatient services [facility].  He understands return precautions.  We talked about stress reduction and techniques to help break panic attacks and anxiety attacks.  He  understands to follow-up outpatient.  He understands how to use Xanax properly as we discussed this as well.  Discharged in good condition.  Understands return precautions.  This chart was dictated using voice recognition software.  Despite best efforts to proofread,  errors can occur which can change the documentation meaning.         Final Clinical Impression(s) / ED Diagnoses Final diagnoses:  Panic attack  Anxiety    Rx / DC Orders ED Discharge Orders          Ordered    ALPRAZolam (XANAX) 1 MG tablet  2 times daily PRN        03/05/23 1412              Virgina Norfolk, DO 03/05/23 1415

## 2023-03-13 ENCOUNTER — Encounter (HOSPITAL_COMMUNITY): Payer: Self-pay | Admitting: *Deleted

## 2023-03-13 ENCOUNTER — Emergency Department (HOSPITAL_COMMUNITY)
Admission: EM | Admit: 2023-03-13 | Discharge: 2023-03-13 | Disposition: A | Discharge status: Other Acute Inpatient Hospital | Payer: BLUE CROSS/BLUE SHIELD | Attending: Emergency Medicine | Admitting: Emergency Medicine

## 2023-03-13 ENCOUNTER — Other Ambulatory Visit (HOSPITAL_COMMUNITY)
Admission: EM | Admit: 2023-03-13 | Discharge: 2023-03-20 | Disposition: A | Discharge status: Home / Self Care | Payer: BLUE CROSS/BLUE SHIELD | Attending: Psychiatry | Admitting: Psychiatry

## 2023-03-13 DIAGNOSIS — F411 Generalized anxiety disorder: Secondary | ICD-10-CM

## 2023-03-13 DIAGNOSIS — F1310 Sedative, hypnotic or anxiolytic abuse, uncomplicated: Secondary | ICD-10-CM | POA: Diagnosis not present

## 2023-03-13 DIAGNOSIS — F332 Major depressive disorder, recurrent severe without psychotic features: Secondary | ICD-10-CM

## 2023-03-13 DIAGNOSIS — F439 Reaction to severe stress, unspecified: Secondary | ICD-10-CM

## 2023-03-13 DIAGNOSIS — F329 Major depressive disorder, single episode, unspecified: Secondary | ICD-10-CM | POA: Diagnosis present

## 2023-03-13 DIAGNOSIS — Y900 Blood alcohol level of less than 20 mg/100 ml: Secondary | ICD-10-CM | POA: Diagnosis not present

## 2023-03-13 DIAGNOSIS — F1210 Cannabis abuse, uncomplicated: Secondary | ICD-10-CM | POA: Diagnosis not present

## 2023-03-13 LAB — COMPREHENSIVE METABOLIC PANEL
ALT: 11 U/L (ref 0–44)
AST: 22 U/L (ref 15–41)
Albumin: 4.3 g/dL (ref 3.5–5.0)
Alkaline Phosphatase: 55 U/L (ref 38–126)
Anion gap: 8 (ref 5–15)
BUN: 5 mg/dL — ABNORMAL LOW (ref 6–20)
CO2: 28 mmol/L (ref 22–32)
Calcium: 8.7 mg/dL — ABNORMAL LOW (ref 8.9–10.3)
Chloride: 99 mmol/L (ref 98–111)
Creatinine, Ser: 0.7 mg/dL (ref 0.61–1.24)
GFR, Estimated: >60 mL/min (ref >60)
Glucose, Bld: 120 mg/dL — ABNORMAL HIGH (ref 70–99)
Potassium: 3.9 mmol/L (ref 3.5–5.1)
Sodium: 135 mmol/L (ref 135–145)
Total Bilirubin: 0.8 mg/dL (ref 0.0–1.2)
Total Protein: 7.1 g/dL (ref 6.5–8.1)

## 2023-03-13 LAB — CBC
HCT: 43.5 % (ref 39.0–52.0)
Hemoglobin: 14.8 g/dL (ref 13.0–17.0)
MCH: 30.8 pg (ref 26.0–34.0)
MCHC: 34 g/dL (ref 30.0–36.0)
MCV: 90.4 fL (ref 80.0–100.0)
Platelets: 288 10*3/uL (ref 150–400)
RBC: 4.81 MIL/uL (ref 4.22–5.81)
RDW: 12.2 % (ref 11.5–15.5)
WBC: 10.9 10*3/uL — ABNORMAL HIGH (ref 4.0–10.5)
nRBC: 0 % (ref 0.0–0.2)

## 2023-03-13 LAB — RAPID URINE DRUG SCREEN, HOSP PERFORMED
Amphetamines: NOT DETECTED
Barbiturates: NOT DETECTED
Benzodiazepines: POSITIVE — AB
Cocaine: NOT DETECTED
Opiates: NOT DETECTED
Tetrahydrocannabinol: POSITIVE — AB

## 2023-03-13 LAB — ETHANOL: Alcohol, Ethyl (B): <10 mg/dL (ref <10)

## 2023-03-13 MED ORDER — HALOPERIDOL 5 MG PO TABS: 5.0000 mg | ORAL_TABLET | Freq: Three times a day (TID) | ORAL | Status: DC | PRN

## 2023-03-13 MED ORDER — NICOTINE 21 MG/24HR TD PT24
21.0000 mg | MEDICATED_PATCH | Freq: Every day | TRANSDERMAL | Status: DC
  Administered 2023-03-13 (×2): 21 mg via TRANSDERMAL
  Filled 2023-03-13 (×2): qty 1

## 2023-03-13 MED ORDER — BUSPIRONE HCL 5 MG PO TABS
10.0000 mg | ORAL_TABLET | Freq: Three times a day (TID) | ORAL | Status: DC
Start: 2023-03-13 — End: 2023-03-20
  Administered 2023-03-13 – 2023-03-20 (×21): 10 mg via ORAL
  Filled 2023-03-13 (×21): qty 2

## 2023-03-13 MED ORDER — ALUM & MAG HYDROXIDE-SIMETH 200-200-20 MG/5ML PO SUSP: 30.0000 mL | ORAL | Status: DC | PRN

## 2023-03-13 MED ORDER — MAGNESIUM HYDROXIDE 400 MG/5ML PO SUSP: 30.0000 mL | Freq: Every day | ORAL | Status: DC | PRN

## 2023-03-13 MED ORDER — LORAZEPAM 1 MG PO TABS
1.0000 mg | ORAL_TABLET | Freq: Once | ORAL | Status: AC
  Administered 2023-03-13: 1 mg via ORAL
  Filled 2023-03-13: qty 1

## 2023-03-13 MED ORDER — HYDROXYZINE HCL 25 MG PO TABS
25.0000 mg | ORAL_TABLET | Freq: Three times a day (TID) | ORAL | Status: DC | PRN
  Administered 2023-03-13: 25 mg via ORAL
  Filled 2023-03-13: qty 1

## 2023-03-13 MED ORDER — VENLAFAXINE HCL ER 37.5 MG PO CP24
37.5000 mg | ORAL_CAPSULE | Freq: Every day | ORAL | Status: DC
  Administered 2023-03-14 – 2023-03-15 (×2): 37.5 mg via ORAL
  Filled 2023-03-13 (×2): qty 1

## 2023-03-13 NOTE — ED Notes (Signed)
 Patient was provided lunch

## 2023-03-13 NOTE — Group Note (Signed)
Group Topic: Healthy Self Image and Positive Change  Group Date: 03/13/2023 Start Time: 1115 End Time: 1205 Facilitators: Cassandria Anger  Department: [facility]  Number of Participants: 2  Group Focus: acceptance Treatment Modality:  Psychoeducation Interventions utilized were patient education Purpose: increase insight  Name: Michael [name] Date of Birth: February 10, 1975  MR: [id number]    Level of Participation: Patient had just arrived on unit and was getting settled at time of group Quality of Participation: Patient had just arrived on unit and was getting settled at time of group Interactions with others: N/A Mood/Affect: N/A Triggers (if applicable): N/A Cognition: N/A Progress: Other Response: N/A Plan: N/A  Patients Problems:  Patient Active Problem List   Diagnosis Date Noted   MDD (major depressive disorder) 03/13/2023   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

## 2023-03-13 NOTE — ED Provider Notes (Addendum)
Facility Based Crisis Admission H&P  Date: 03/13/23 Patient Name: Michael [name] MRN: [id number] Chief Complaint: The patient is a 48 year old male with history of depression and suicidal ideations.  He is voluntary and went to the ED requesting help.  Diagnoses:  Final diagnoses:  None    HPI: The patient is a 48 year old male who initially presented himself to the emergency department requesting help for anxiety and panic attacks.  He claims that he was on alprazolam in the past but has able to wean himself off the medication.  He had stopped his antidepressants.  He stopped outpatient follow-up and ever since his last admission to the [facility] in October 2024, he had stopped all his medications.  He states that he is homeless and has been living in a self storage facility for over a year.  He has been getting progressively depressed with frequent anxiety attacks and states that he started having thoughts of wanting to die and not be" around anymore".  He denied any psychotic symptoms and denied any delusions.  He reports that he has no specific thoughts or plans to hurt himself but wanted help and wanted to be back on some antidepressant and something for anxiety. He denies any alcohol or substance abuse.  His UDS was positive for cannabinoids and benzodiazepines. However he does not seem to have any acute withdrawal symptoms or benzodiazepines at this time. After being medically cleared the patient was evaluated for possible admission to [facility].  The patient was seen here in the [facility] he was alert oriented and cooperative.  He maintained fair to good eye contact.  His speech is coherent without any obvious looseness of association or flight of ideas or tangentiality.  He is able to contract for safety.  He endorses depression and anxiety with passive suicidal ideations with no specific plan or intent.  He is able to contract for safety and he appears to be cognitively intact.  PHQ 2-9:   Flowsheet Row ED  from 03/13/2023 in [facility] Most recent reading at 03/13/2023 12:27 PM ED from 03/13/2023 in [facility] Emergency Department at [facility] Most recent reading at 03/13/2023  3:02 AM ED from 03/05/2023 in [facility] Department at [facility] Most recent reading at 03/05/2023 12:02 PM  C-SSRS RISK CATEGORY Low Risk Low Risk No Risk         Total Time spent with patient: 30 minutes  Musculoskeletal  Strength & Muscle Tone: within normal limits Gait & Station: normal Patient leans: N/A  Psychiatric Specialty Exam  Presentation General Appearance:  Disheveled  Eye Contact: Good  Speech: Clear and Coherent  Speech Volume: Normal  Handedness: Right   Mood and Affect  Mood: Depressed  Affect: Congruent   Thought Process  Thought Processes: Coherent  Descriptions of Associations:Intact  Orientation:Full (Time, Place and Person)  Thought Content:Logical  Diagnosis of Schizophrenia or Schizoaffective disorder in past: No   Hallucinations:Hallucinations: None  Ideas of Reference:None  Suicidal Thoughts:Suicidal Thoughts: Yes, Passive SI Passive Intent and/or Plan: Without Intent; Without Plan  Homicidal Thoughts:Homicidal Thoughts: No   Sensorium  Memory: Immediate Good; Recent Good  Judgment: Fair  Insight: Present   Executive Functions  Concentration: Good  Attention Span: Fair  Recall: Fair  Fund of Knowledge: Good  Language: Good   Psychomotor Activity  Psychomotor Activity: Psychomotor Activity: Normal   Assets  Assets: Desire for Improvement; Housing; Intimacy   Sleep  Sleep: Sleep: Fair   No  data recorded  Physical Exam ROS  Blood pressure 128/87, pulse 79, temperature 99 F (37.2 C), temperature source Oral, resp. rate 18, SpO2 100%. There is no height or weight on file to calculate BMI.  Past Psychiatric History: The patient has a longstanding history of  major depression, generalized anxiety disorder and panic attacks.  His previous admission to [facility] was in October.  He was discharged with outpatient follow-up and was placed on BuSpar 10 mg twice a day, trazodone 50 mg at bedtime and Effexor XR 150 mg a day.  Patient essentially stopped all the medications.  Is the patient at risk to self? Yes  Has the patient been a risk to self in the past 6 months? Yes .    Has the patient been a risk to self within the distant past? Yes   Is the patient a risk to others? No   Has the patient been a risk to others in the past 6 months? No   Has the patient been a risk to others within the distant past? 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 Social History: Patient is currently single.  He is homeless and lives in an self storage facility.  He was married once but has been divorced for over 10 years.  He has a daughter age 49 but states that he has not seen her in a long time and she lives in [place].  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  Last Labs:  Admission on 03/13/2023, Discharged on 03/13/2023  Component Date Value Ref Range Status   Sodium 03/13/2023 135  135 - 145 mmol/L Final   Potassium 03/13/2023 3.9  3.5 - 5.1 mmol/L Final   Chloride 03/13/2023 99  98 - 111 mmol/L Final   CO2 03/13/2023 28  22 - 32 mmol/L Final   Glucose, Bld 03/13/2023 120 (H)  70 - 99 mg/dL Final   Glucose reference range applies only to samples taken after fasting for at least 8 hours.   BUN 03/13/2023 5 (L)  6 - 20 mg/dL Final   Creatinine, Ser 03/13/2023 0.70  0.61 - 1.24 mg/dL Final   Calcium 16/11/9602 8.7 (L)  8.9 - 10.3 mg/dL Final   Total Protein 54/10/8117 7.1  6.5 - 8.1 g/dL Final   Albumin 14/78/2956 4.3  3.5 - 5.0 g/dL Final   AST 21/30/8657 22  15 - 41 U/L Final   ALT  03/13/2023 11  0 - 44 U/L Final   Alkaline Phosphatase 03/13/2023 55  38 - 126 U/L Final   Total Bilirubin 03/13/2023 0.8  0.0 - 1.2 mg/dL Final   GFR, Estimated 03/13/2023 >60  >60 mL/min Final   Comment: (NOTE) Calculated using the CKD-EPI Creatinine Equation (2021)    Anion gap 03/13/2023 8  5 - 15 Final   Performed at [facility], [address] [place], [place] [address]   Alcohol, Ethyl (B) 03/13/2023 <10  <10 mg/dL Final   Comment: (NOTE) Lowest detectable limit for serum alcohol is 10 mg/dL.  For medical purposes only. Performed at [facility], [address], [place], [place] [address]    WBC 03/13/2023 10.9 (H)  4.0 - 10.5 K/uL Final   RBC 03/13/2023 4.81  4.22 - 5.81 MIL/uL Final   Hemoglobin 03/13/2023 14.8  13.0 - 17.0 g/dL Final   HCT 56/38/7564 43.5  39.0 - 52.0 % Final   MCV 03/13/2023 90.4  80.0 - 100.0 fL Final   MCH 03/13/2023 30.8  26.0 - 34.0 pg Final   MCHC 03/13/2023 34.0  30.0 - 36.0 g/dL Final   RDW 33/29/5188 12.2  11.5 - 15.5 % Final   Platelets 03/13/2023 288  150 - 400 K/uL Final   nRBC 03/13/2023 0.0  0.0 - 0.2 % Final   Performed at [facility], [address], [place], [place] [address]   Opiates 03/13/2023 NONE DETECTED  NONE DETECTED Final   Cocaine 03/13/2023 NONE DETECTED  NONE DETECTED Final   Benzodiazepines 03/13/2023 POSITIVE (A)  NONE DETECTED Final   Amphetamines 03/13/2023 NONE DETECTED  NONE DETECTED Final   Tetrahydrocannabinol 03/13/2023 POSITIVE (A)  NONE DETECTED Final   Barbiturates 03/13/2023 NONE DETECTED  NONE DETECTED Final   Comment: (NOTE) DRUG SCREEN FOR MEDICAL PURPOSES ONLY.  IF CONFIRMATION IS NEEDED FOR ANY PURPOSE, NOTIFY LAB WITHIN 5 DAYS.  LOWEST DETECTABLE LIMITS FOR URINE DRUG SCREEN Drug Class                     Cutoff (ng/mL) Amphetamine and metabolites    1000 Barbiturate and metabolites    200 Benzodiazepine                 200 Opiates and  metabolites        300 Cocaine and metabolites        300 THC                            50 Performed at [facility] [address], [place], [place] [address]   Admission on 11/15/2022, Discharged on 11/25/2022  Component Date Value Ref Range Status   Sodium 11/19/2022 135  135 - 145 mmol/L Final   Potassium 11/19/2022 3.8  3.5 - 5.1 mmol/L Final   Chloride 11/19/2022 98  98 - 111 mmol/L Final   CO2 11/19/2022 24  22 - 32 mmol/L Final   Glucose, Bld 11/19/2022 96  70 - 99 mg/dL Final   Glucose reference range applies only to samples taken after fasting for at least 8 hours.   BUN 11/19/2022 9  6 - 20 mg/dL Final   Creatinine, Ser 11/19/2022 0.75  0.61 - 1.24 mg/dL Final   Calcium 02/16/3233 8.9  8.9 - 10.3 mg/dL Final   GFR, Estimated 11/19/2022 >60  >60 mL/min Final   Comment: (NOTE) Calculated using the CKD-EPI Creatinine Equation (2021)    Anion gap 11/19/2022 13  5 - 15 Final   Performed at [facility], [address] [place], [place] [address]   WBC 11/19/2022 8.9  4.0 - 10.5 K/uL Final   RBC 11/19/2022 4.56  4.22 - 5.81 MIL/uL Final   Hemoglobin 11/19/2022 14.3  13.0 - 17.0 g/dL Final   HCT 02/54/2706 43.3  39.0 - 52.0 % Final   MCV 11/19/2022 95.0  80.0 - 100.0 fL Final   MCH 11/19/2022 31.4  26.0 - 34.0 pg Final   MCHC 11/19/2022 33.0  30.0 - 36.0 g/dL Final   RDW 23/76/2831 12.2  11.5 - 15.5 % Final   Platelets 11/19/2022 337  150 - 400 K/uL Final   nRBC 11/19/2022 0.0  0.0 - 0.2 % Final   Performed at [facility], [address] [place], [place] [address]   Glucose-Capillary 11/19/2022 98  70 - 99 mg/dL Final   Glucose reference range applies only to samples taken after fasting for at least 8 hours.  Admission on 11/14/2022, Discharged on 11/15/2022  Component Date Value Ref Range Status   Sodium 11/14/2022 136  135 - 145 mmol/L Final   Potassium 11/14/2022 4.0  3.5 - 5.1 mmol/L Final   Chloride 11/14/2022 100  98 - 111 mmol/L  Final   CO2 11/14/2022 27  22 - 32 mmol/L Final   Glucose, Bld 11/14/2022 117 (H)  70 - 99 mg/dL Final   Glucose reference range applies only to samples taken after fasting for at least 8 hours.   BUN 11/14/2022 7  6 - 20 mg/dL Final   Creatinine, Ser 11/14/2022 0.84  0.61 - 1.24 mg/dL Final   Calcium 60/45/4098 8.8 (L)  8.9 - 10.3 mg/dL Final   Total Protein 11/91/4782 7.7  6.5 - 8.1 g/dL Final   Albumin 95/62/1308 4.2  3.5 - 5.0 g/dL Final   AST 65/78/4696 12 (L)  15 - 41 U/L Final   ALT 11/14/2022 11  0 - 44 U/L Final   Alkaline Phosphatase 11/14/2022 59  38 - 126 U/L Final   Total Bilirubin 11/14/2022 1.4 (H)  0.3 - 1.2 mg/dL Final   GFR, Estimated 11/14/2022 >60  >60 mL/min Final   Comment: (NOTE) Calculated using the CKD-EPI Creatinine Equation (2021)    Anion gap 11/14/2022 9  5 - 15 Final   Performed at [facility] [address], [place], [place] [address]   Alcohol, Ethyl (B) 11/14/2022 <10  <10 mg/dL Final   Comment: (NOTE) Lowest detectable limit for serum alcohol is 10 mg/dL.  For medical purposes only. Performed at [facility], [address], [place], [place] [address]    WBC 11/14/2022 4.7  4.0 - 10.5 K/uL Final   RBC 11/14/2022 4.59  4.22 - 5.81 MIL/uL Final   Hemoglobin 11/14/2022 14.9  13.0 - 17.0 g/dL Final   HCT 40/11/2723 44.5  39.0 - 52.0 % Final   MCV 11/14/2022 96.9  80.0 - 100.0 fL Final   MCH 11/14/2022 32.5  26.0 - 34.0 pg Final   MCHC 11/14/2022 33.5  30.0 - 36.0 g/dL Final   RDW 36/64/4034 12.8  11.5 - 15.5 % Final   Platelets 11/14/2022 319  150 - 400 K/uL Final   nRBC 11/14/2022 0.0  0.0 - 0.2 % Final   Performed at [facility], [address], [place], [place] [address]   Opiates 11/14/2022 NONE DETECTED  NONE DETECTED Final   Cocaine 11/14/2022 NONE DETECTED  NONE DETECTED Final   Benzodiazepines 11/14/2022 POSITIVE (A)  NONE DETECTED Final   Amphetamines 11/14/2022 NONE DETECTED   NONE DETECTED Final   Tetrahydrocannabinol 11/14/2022 NONE DETECTED  NONE DETECTED Final   Barbiturates 11/14/2022 NONE DETECTED  NONE DETECTED Final   Comment: (NOTE) DRUG SCREEN FOR MEDICAL PURPOSES ONLY.  IF CONFIRMATION IS NEEDED FOR ANY PURPOSE, NOTIFY LAB WITHIN 5 DAYS.  LOWEST DETECTABLE LIMITS FOR URINE DRUG SCREEN Drug Class                     Cutoff (ng/mL) Amphetamine and metabolites    1000 Barbiturate and metabolites    200 Benzodiazepine                 200 Opiates and metabolites  300 Cocaine and metabolites        300 THC                            50 Performed at [facility], [address] [place], [place] [address]     Allergies: Melatonin, Neurontin [gabapentin], Trazodone and nefazodone, and Tylenol pm extra strength [diphenhydramine-apap (sleep)]  Medications:  Facility Ordered Medications  Medication   [COMPLETED] LORazepam (ATIVAN) tablet 1 mg   alum & mag hydroxide-simeth (MAALOX/MYLANTA) 200-200-20 MG/5ML suspension 30 mL   magnesium hydroxide (MILK OF MAGNESIA) suspension 30 mL   hydrOXYzine (ATARAX) tablet 25 mg   [START ON 03/14/2023] venlafaxine XR (EFFEXOR-XR) 24 hr capsule 37.5 mg   haloperidol (HALDOL) tablet 5 mg   PTA Medications  Medication Sig   ALPRAZolam (XANAX) 1 MG tablet Take 1 tablet (1 mg total) by mouth 2 (two) times daily as needed for up to 12 doses for anxiety. (Patient not taking: Reported on 03/13/2023)    Long Term Goals: Improvement in symptoms so as ready for discharge  Short Term Goals: Patient will verbalize feelings in meetings with treatment team members., Patient will attend at least of 50% of the groups daily., Pt will complete the PHQ9 on admission, day 3 and discharge., Patient will participate in completing the [place] Suicide Severity Rating Scale, Patient will score a low risk of violence for 24 hours prior to discharge, and Patient will take medications as prescribed daily.  Medical  Decision Making  The patient is admitted to restart his medications that he has stopped since November of last year.  He continues to complain of persistent depression and passive suicidal ideations but is not psychotic and is contracting for safety.  He meets admission criteria for FBC.  On admission he was started on Effexor XR 37.5 mg which should be titrated up as tolerated.  He was also started on BuSpar 10 mg p.o. 3 times daily.  He will benefit from IOP upon discharge.  Recommendations  Based on my evaluation the patient does not appear to have an emergency medical condition.  Rex Kras, MD 03/13/23  12:12 PM

## 2023-03-13 NOTE — ED Notes (Signed)
I bag of belongings in triage cabinet

## 2023-03-13 NOTE — BH Assessment (Signed)
Comprehensive Clinical Assessment (CCA) Note   03/13/2023 Michael [name] [id number]  Disposition: Roselyn Bering, NP recommends continuous observation and pt will be seen by psychiatry in AM.   The patient demonstrates the following risk factors for suicide: Chronic risk factors for suicide include: psychiatric disorder of general anxiety disorder . Acute risk factors for suicide include: social withdrawal/isolation. Protective factors for this patient include: responsibility to others (children, family). Considering these factors, the overall suicide risk at this point appears to be low. Patient is not appropriate for outpatient follow up.    Per EDP's note: "Pt is a 48 y.o. male. Patient with history of substance abuse, depression, anxiety presents with depression, feels unsafe at home because he's not sure he won't hurt himself. No HI, AVH. The history is provided by the patient. No language interpreter was used."  On evaluation, patient is alert, oriented x 3, and cooperative. Speech is clear, coherent and logical. Pt appears casual. Eye contact is fair. Mood is anxious and depressed, affect is congruent with mood. Thought process is logical and thought content is coherent. Pt endorses SI but denies a plan. Pt denies HI/AVH. There is no indication that the patient is responding to internal stimuli. No delusions elicited during this assessment.       Chief Complaint:  Chief Complaint  Patient presents with   Depression   Visit Diagnosis:   Major Depressive Disorder     CCA Screening, Triage and Referral (STR)  Patient Reported Information How did you hear about [place]? Self [facility] ED)  What Is the Reason for Your Visit/Call Today? Per EDP's note: "Pt is a 48 y.o. male.     Patient with history of substance abuse, depression, anxiety presents with depression, feels unsafe at home because he's not sure he won't hurt himself. No HI, AVH     The history is provided by the patient. No language  interpreter was used."  How Long Has This Been Causing You Problems? 1-6 months  What Do You Feel Would Help You the Most Today? Treatment for Depression or other mood problem; Housing Assistance; Financial Resources; Stress Management; Medication(s)   Have You Recently Had Any Thoughts About Hurting Yourself? Yes  Are You Planning to Commit Suicide/Harm Yourself At This time? No   Flowsheet Row ED from 03/13/2023 in [facility] Emergency Department at [facility] ED from 03/05/2023 in [facility] Emergency Department at [facility] ED to Hosp-Admission (Discharged) [facility]  C-SSRS RISK CATEGORY Low Risk No Risk No Risk       Have you Recently Had Thoughts About Hurting Someone Karolee Ohs? No  Are You Planning to Harm Someone at This Time? No  Explanation: Denies HI   Have You Used Any Alcohol or Drugs in the Past 24 Hours? Yes  How Long Ago Did You Use Drugs or Alcohol? last night  What Did You Use and How Much? one beer   Do You Currently Have a Therapist/Psychiatrist? No  Name of Therapist/Psychiatrist:    Have You Been Recently Discharged From Any Office Practice or Programs? No  Explanation of Discharge From Practice/Program: n/a    CCA Screening Triage Referral Assessment Type of Contact: Tele-Assessment  Telemedicine Service Delivery: Telemedicine service delivery: This service was provided via telemedicine using a 2-way, interactive audio and video technology  Is this Initial or Reassessment? Is this Initial or Reassessment?: Initial Assessment  Date Telepsych consult ordered in [facility]:  Date Telepsych  consult ordered in [facility]: 03/13/23  Time Telepsych consult ordered in [facility]:  Time Telepsych consult ordered in [facility] 0315  Location of Assessment: [facility] ED  Provider Location: [facility] Assessment Services   Collateral Involvement: none   Does Patient Have a Automotive engineer Guardian?  No  Legal Guardian Contact Information: n/a  Copy of Legal Guardianship Form: -- (n/a)  Legal Guardian Notified of Arrival: -- (n/a)  Legal Guardian Notified of Pending Discharge: -- (n/a)  If Minor and Not Living with Parent(s), Who has Custody? n/a  Is CPS involved or ever been involved? Never  Is APS involved or ever been involved? Never  Patient Determined To Be At Risk for Harm To Self or Others Based on Review of Patient Reported Information or Presenting Complaint? Yes, for Self-Harm  Method: No Plan  Availability of Means: No access or NA  Intent: Vague intent or NA  Notification Required: No need or identified person  Additional Information for Danger to Others Potential: -- (n/a)  Additional Comments for Danger to Others Potential: n/a  Are There Guns or Other Weapons in Your Home? No  Types of Guns/Weapons: n/a  Are These Weapons Safely Secured?                            No  Who Could Verify You Are Able To Have These Secured: denies access  Do You Have any Outstanding Charges, Pending Court Dates, Parole/Probation? denies pending legal charges  Contacted To Inform of Risk of Harm To Self or Others: -- (n/a)    Does Patient Present under Involuntary Commitment? No    [place] of Residence: [place]   Patient Currently Receiving the Following Services: Not Receiving Services   Determination of Need: Urgent (48 hours)   Options For Referral: Outpatient Therapy; Inpatient Hospitalization; Medication Management     CCA Biopsychosocial Patient Reported Schizophrenia/Schizoaffective Diagnosis in Past: No   Strengths: Pt willing to seek treatment   Mental Health Symptoms Depression:  Change in energy/activity; Hopelessness; Tearfulness; Sleep (too much or little)   Duration of Depressive symptoms: Duration of Depressive Symptoms: Greater than two weeks   Mania:  None   Anxiety:   Fatigue; Restlessness; Sleep; Worrying   Psychosis:   None   Duration of Psychotic symptoms:    Trauma:  None   Obsessions:  None   Compulsions:  None   Inattention:  None   Hyperactivity/Impulsivity:  None   Oppositional/Defiant Behaviors:  None   Emotional Irregularity:  Chronic feelings of emptiness   Other Mood/Personality Symptoms:  none    Mental Status Exam Appearance and self-care  Stature:  Average   Weight:  Average weight   Clothing:  Casual (scrubs)   Grooming:  Normal   Cosmetic use:  None   Posture/gait:  Normal   Motor activity:  Not Remarkable   Sensorium  Attention:  Normal   Concentration:  Normal   Orientation:  X5   Recall/memory:  Normal   Affect and Mood  Affect:  Depressed   Mood:  Depressed   Relating  Eye contact:  Normal   Facial expression:  Depressed   Attitude toward examiner:  Argumentative   Thought and Language  Speech flow: Clear and Coherent   Thought content:  Appropriate to Mood and Circumstances   Preoccupation:  None   Hallucinations:  None   Organization:  Coherent   [employer] of Knowledge:  Fair  Intelligence:  Average   Abstraction:  Functional   Judgement:  Fair   Dance movement psychotherapist:  Realistic   Insight:  Fair   Decision Making:  Impulsive; Normal   Social Functioning  Social Maturity:  Isolates   Social Judgement:  Normal   Stress  Stressors:  Housing; Surveyor, quantity; Veterinary surgeon; Transitions   Coping Ability:  Normal   Skill Deficits:  Interpersonal; Self-care   Supports:  Support needed     Religion: Religion/Spirituality Are You A Religious Person?: No How Might This Affect Treatment?: n/a  Leisure/Recreation: Leisure / Recreation Do You Have Hobbies?: No  Exercise/Diet: Exercise/Diet Do You Exercise?: No Have You Gained or Lost A Significant Amount of Weight in the Past Six Months?: No Do You Follow a Special Diet?: No Do You Have Any Trouble Sleeping?: Yes Explanation of Sleeping Difficulties: Pt  reports only getting 2 hours of sleep at a time.   CCA Employment/Education Employment/Work Situation: Employment / Work Situation Employment Situation: Unemployed Patient's Job has Been Impacted by Current Illness: No Has Patient ever Been in Equities trader?: No  Education: Education Is Patient Currently Attending School?: No Last Grade Completed: 12 Did You Product manager?: No Did You Have An Individualized Education Program (IIEP): No Did You Have Any Difficulty At [employer]?: No Patient's Education Has Been Impacted by Current Illness: No   CCA Family/Childhood History Family and Relationship History: Family history Marital status: Single Does patient have children?: Yes How many children?: 1 How is patient's relationship with their children?: UTA  Childhood History:  Childhood History By whom was/is the patient raised?: Both parents Did patient suffer any verbal/emotional/physical/sexual abuse as a child?: No Did patient suffer from severe childhood neglect?: No Has patient ever been sexually abused/assaulted/raped as an adolescent or adult?: No Was the patient ever a victim of a crime or a disaster?: No Witnessed domestic violence?: No Has patient been affected by domestic violence as an adult?: No       CCA Substance Use Alcohol/Drug Use: Alcohol / Drug Use Pain Medications: See MAR Prescriptions: See MAR Over the Counter: See MAR History of alcohol / drug use?: No history of alcohol / drug abuse Longest period of sobriety (when/how long): Pt reports drinking one beer yesterday to calm his anxiety. Pt reports that he does not drink etoh on a regular and thought the one beer was nasty. Denies use of drugs Negative Consequences of Use:  (n/a) Withdrawal Symptoms:  (n/a)                         ASAM's:  Six Dimensions of Multidimensional Assessment  Dimension 1:  Acute Intoxication and/or Withdrawal Potential:      Dimension 2:  Biomedical  Conditions and Complications:      Dimension 3:  Emotional, Behavioral, or Cognitive Conditions and Complications:     Dimension 4:  Readiness to Change:     Dimension 5:  Relapse, Continued use, or Continued Problem Potential:     Dimension 6:  Recovery/Living Environment:     ASAM Severity Score:    ASAM Recommended Level of Treatment: ASAM Recommended Level of Treatment:  (n/a)   Substance use Disorder (SUD) Substance Use Disorder (SUD)  Checklist Symptoms of Substance Use:  (n/a)  Recommendations for Services/Supports/Treatments: Recommendations for Services/Supports/Treatments Recommendations For Services/Supports/Treatments: Other (Comment) (overnight observation)  Disposition Recommendation per psychiatric provider:  Recommended for continuous observation. Pt will be seen by psychiatry in AM.   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

## 2023-03-13 NOTE — ED Notes (Signed)
Pt is in the bedroom calm and sleeping. NAD Respirations are even and unlabored. Will continue to monitor for safety.

## 2023-03-13 NOTE — ED Notes (Addendum)
Patient is A&O x 4 calm, cooperative with a saddened facial expression and congruent affect. Patient states he suffers with anxiety frequently. Patient endorses he has been feeling hopeless and worthless for approximately a year and it recently has gotten to the point of him not wanting to live. Patient states he is not suicidal, but would not mind if he did not wake up. Patient endorses he feels hopeful now that he is at the facility. He reports his reason for living is his 46 year old daughter for whom he needs to be available. Patient observed in reflection and smiling while talking of daughter. Patient currently denies SI, HI, AVH and does not appear to be responding to internal stimuli.

## 2023-03-13 NOTE — Group Note (Signed)
Group Topic: Recovery Basics  Group Date: 03/13/2023 Start Time: 2000 End Time: 2030 Facilitators: Guss Bunde  Department: [facility]  Number of Participants: 4  Group Focus: check in Treatment Modality:  Solution-Focused Therapy Interventions utilized were support Purpose: reinforce self-care  Name: Michael [name] Date of Birth: [birth year]  MR: [id number]    Level of Participation: active Quality of Participation: cooperative Interactions with others: gave feedback Mood/Affect: appropriate Triggers (if applicable):  Cognition: goal directed Progress: Gaining insight Response: Pt wants to follow all directions of treatment plan Plan: patient will be encouraged to follow up with goals  Patients Problems:  Patient Active Problem List   Diagnosis Date Noted   MDD (major depressive disorder) 03/13/2023   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

## 2023-03-13 NOTE — ED Provider Notes (Signed)
Patient will be transferred to the acute crisis unit at behavioral health urgent care.  Patient has been medically cleared.  Patient understands that he is getting transferred there for continued mental health treatment.   Linwood Dibbles, MD 03/13/23 [phone]

## 2023-03-13 NOTE — ED Notes (Signed)
Patient in the Dayroom calm and composed watching TV NAD, respirations are even and unlabored. The environment is secured per policy and Will monitor for safety.

## 2023-03-13 NOTE — Group Note (Signed)
Group Topic: Social Support  Group Date: 03/13/2023 Start Time: 1600 End Time: 1700 Facilitators: Mayer Camel L, RN  Department: [facility]  Number of Participants: 4  Group Focus: anxiety, coping skills, and social skills Treatment Modality:  Psychoeducation Interventions utilized were group exercise, mental fitness, patient education, and support Purpose: enhance coping skills, express feelings, improve communication skills, and increase insight  Name: Michael [name] Date of Birth: [birth year]  MR: [id number]    Level of Participation: active Quality of Participation: cooperative, engaged, initiates communication, motivated, offered feedback, and supportive Interactions with others: gave feedback Mood/Affect: appropriate and positive Triggers (if applicable): n/a Cognition: coherent/clear, insightful, and logical Progress: Gaining insight Response: Patient was encouraging and supportive of peers. Patient gave insightful information and was thanked by another peer for his communication. Plan: follow-up needed  Patients Problems:  Patient Active Problem List   Diagnosis Date Noted  . MDD (major depressive disorder) 03/13/2023  . Severe benzodiazepine use disorder (HCC) 11/21/2022  . 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 GAD (generalized anxiety disorder) 11/14/2022  . Panic disorder 11/14/2022  . Major depressive disorder, recurrent, severe without psychotic features (HCC) 09/14/2021

## 2023-03-13 NOTE — ED Triage Notes (Signed)
Pt here for depression and panic attacks, ongoing for a while. Denies any triggers other than 'life' causing depression. When asked about SI, reports " I Don't know". Appears sad and tearful at times

## 2023-03-13 NOTE — ED Provider Notes (Signed)
[place] EMERGENCY DEPARTMENT AT [facility] Provider Note   CSN: [id number] Arrival date & time: 03/13/23  0157     History  Chief Complaint  Patient presents with   Depression    Michael [name] is a 48 y.o. male.  Patient with history of substance abuse, depression, anxiety presents with depression, feels unsafe at home because he's not sure he won't hurt himself. No HI, AVH  The history is provided by the patient. No language interpreter was used.  Depression       Home Medications Prior to Admission medications   Medication Sig Start Date End Date Taking? Authorizing Provider  ALPRAZolam Prudy Feeler) 1 MG tablet Take 1 tablet (1 mg total) by mouth 2 (two) times daily as needed for up to 12 doses for anxiety. 03/05/23   Curatolo, Adam, DO  DESCOVY 200-25 MG tablet Take 1 tablet by mouth daily.    [provider]  docusate sodium (COLACE) 100 MG capsule Take 1 capsule (100 mg total) by mouth daily as needed for mild constipation. 11/25/22   Massengill, Harrold Donath, MD  doxycycline (ADOXA) 100 MG tablet Take 200 mg by mouth See admin instructions. "Take 200 mg (2 tablets) by mouth up to 72 hours after intercourse. Do not exceed more than 200 mg (two tablets) in 24 hrs."    [provider]  hydrOXYzine (ATARAX) 25 MG tablet Take 1 tablet (25 mg total) by mouth 3 (three) times daily as needed for anxiety. 11/25/22   Massengill, Harrold Donath, MD  mirtazapine (REMERON) 15 MG tablet Take 1 tablet (15 mg total) by mouth at bedtime. 11/25/22 12/25/22  Massengill, Harrold Donath, MD  nicotine (NICODERM CQ - DOSED IN MG/24 HOURS) 21 mg/24hr patch Place 1 patch (21 mg total) onto the skin daily. 11/26/22   Massengill, Harrold Donath, MD  traZODone (DESYREL) 50 MG tablet Take 1 tablet (50 mg total) by mouth at bedtime as needed for sleep (Pt states that he is not allergic). 11/25/22   Massengill, Harrold Donath, MD  venlafaxine XR (EFFEXOR-XR) 150 MG 24 hr capsule Take 1 capsule (150 mg total) by  mouth daily with breakfast. 11/26/22 12/26/22  Massengill, Harrold Donath, MD  VISINE DRY EYE RELIEF 1 % SOLN Apply 1 drop to eye 2 (two) times daily as needed (for irritation).    [provider]      Allergies    Melatonin, Neurontin [gabapentin], Trazodone and nefazodone, and Tylenol pm extra strength [diphenhydramine-apap (sleep)]    Review of Systems   Review of Systems  Psychiatric/Behavioral:  Positive for depression.     Physical Exam Updated Vital Signs BP 121/87   Pulse 80   Temp 98.1 F (36.7 C) (Oral)   Resp 18   SpO2 100%  Physical Exam Constitutional:      Appearance: He is well-developed.  Cardiovascular:     Rate and Rhythm: Normal rate.  Pulmonary:     Effort: Pulmonary effort is normal.  Abdominal:     General: There is no distension.  Musculoskeletal:        General: Normal range of motion.     Cervical back: Normal range of motion.  Skin:    General: Skin is warm and dry.  Neurological:     Mental Status: He is alert and oriented to person, place, and time.  Psychiatric:        Attention and Perception: He does not perceive auditory or visual hallucinations.        Mood and Affect: Mood is  depressed.        Speech: Speech normal.        Behavior: Behavior is slowed. Behavior is cooperative.        Thought Content: Thought content does not include homicidal ideation.     ED Results / Procedures / Treatments   Labs (all labs ordered are listed, but only abnormal results are displayed) Labs Reviewed  COMPREHENSIVE METABOLIC PANEL - Abnormal; Notable for the following components:      Result Value   Glucose, Bld 120 (*)    BUN 5 (*)    Calcium 8.7 (*)    All other components within normal limits  CBC - Abnormal; Notable for the following components:   WBC 10.9 (*)    All other components within normal limits  ETHANOL  RAPID URINE DRUG SCREEN, HOSP PERFORMED    EKG None  Radiology No results found.  Procedures Procedures     Medications Ordered in ED Medications  nicotine (NICODERM CQ - dosed in mg/24 hours) patch 21 mg (21 mg Transdermal Patch Applied 03/13/23 0319)  LORazepam (ATIVAN) tablet 1 mg (1 mg Oral Given 03/13/23 0319)    ED Course/ Medical Decision Making/ A&P Clinical Course as of 03/13/23 4098  Wynelle Link Mar 13, 2023  0312 Patient with history of depression, anxiety, here with depression, unsure he won't hurt himself.  [SU]  0631 Pending TTS evaluation. Patient is considered medically cleared for evaluation. Disposition to be determined by psychiatry.  [SU]    Clinical Course User Index [SU] Elpidio Anis, PA-C                                 Medical Decision Making Amount and/or Complexity of Data Reviewed Labs: ordered.  Risk OTC drugs. Prescription drug management.           Final Clinical Impression(s) / ED Diagnoses Final diagnoses:  Severe episode of recurrent major depressive disorder, without psychotic features [facility])    Rx / DC Orders ED Discharge Orders     None         Elpidio Anis, PA-C 03/13/23 1191    Gilda Crease, MD 03/14/23 [phone]

## 2023-03-14 DIAGNOSIS — F332 Major depressive disorder, recurrent severe without psychotic features: Secondary | ICD-10-CM | POA: Diagnosis not present

## 2023-03-14 MED ORDER — QUETIAPINE FUMARATE 25 MG PO TABS
25.0000 mg | ORAL_TABLET | Freq: Four times a day (QID) | ORAL | Status: DC | PRN
  Administered 2023-03-15: 25 mg via ORAL
  Filled 2023-03-14: qty 1

## 2023-03-14 MED ORDER — QUETIAPINE FUMARATE 50 MG PO TABS
50.0000 mg | ORAL_TABLET | Freq: Every day | ORAL | Status: DC
  Administered 2023-03-14: 50 mg via ORAL
  Filled 2023-03-14: qty 1

## 2023-03-14 MED ORDER — NICOTINE 21 MG/24HR TD PT24
21.0000 mg | MEDICATED_PATCH | Freq: Every day | TRANSDERMAL | Status: DC
  Administered 2023-03-14 – 2023-03-20 (×7): 21 mg via TRANSDERMAL
  Filled 2023-03-14 (×7): qty 1

## 2023-03-14 NOTE — Group Note (Signed)
Group Topic: Relaxation  Group Date: 03/14/2023 Start Time: 1200 End Time: 1230 Facilitators: Jenean Lindau, RN  Department: [facility]  Number of Participants: 7  Group Focus: coping skills and relaxation Treatment Modality:  Behavior Modification Therapy Interventions utilized were exploration and group exercise Purpose: express feelings, express irrational fears, improve communication skills, increase insight, and regain self-worth  Name: Michael [name] Date of Birth: 1975/12/11  MR: [id number]    Level of Participation: active Quality of Participation: attentive and cooperative Interactions with others: gave feedback Mood/Affect: appropriate Triggers (if applicable):   Cognition: coherent/clear Progress: Gaining insight Response:   Plan: follow-up needed  Patients Problems:  Patient Active Problem List   Diagnosis Date Noted   MDD (major depressive disorder) 03/13/2023   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

## 2023-03-14 NOTE — ED Notes (Signed)
Pt is in the bed awake. Respirations are even and unlabored. No acute distress noted. Will continue to monitor for safety. 

## 2023-03-14 NOTE — ED Provider Notes (Signed)
Behavioral Health Progress Note  Date and Time: 03/14/2023 12:17 PM Name: Michael [name] MRN:  [id number]  Subjective:  Patient reports high anxiety and insomnia as well as depression but notes anxiety is the primary stressor.. States he has been having anxiety attacks since tapering off of Xanax back in October after taking it a number of years. Patient notes PRN hydroxyzine has been ineffective. Patient would like to try Seroquel as reports as well history of trauma as child and intrusive memories. Reports avoidance of situations that remind him of trauma. Denies nightmares on regular basis. Patient did not wish to discuss trauma. Reports poor sleep and appetite. Patient reports passive thoughts of death but denies SI or plan. Patient without tremor, nausea or vomiting. States last regular use  of benzos was in October 2024 but that he has felt very high anxiety and dread since coming off Benzos.  Diagnosis:  Final diagnoses:  MDD (major depressive disorder), recurrent severe, without psychosis (HCC)  GAD (generalized anxiety disorder)  Trauma and stressor-related disorder  History of benzodiazepine dependence  Total Time spent with patient: 30 minutes  Past Psychiatric History: The patient has a longstanding history of major depression, generalized anxiety disorder and panic attacks.  His previous admission to [facility] was in October.  He was discharged with outpatient follow-up and was placed on BuSpar 10 mg twice a day, trazodone 50 mg at bedtime and Effexor XR 150 mg a day.  Patient essentially stopped all the medications.   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 Social History: Patient is currently single.  He is homeless and lives in an self storage facility.  He was married once but has been divorced for over 10 years.  He has a daughter age 13 but states that he has not seen her  in a long time and she lives in [place].  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                         Sleep: Poor  Appetite:  Poor  Current Medications:  Current Facility-Administered Medications  Medication Dose Route Frequency Provider Last Rate Last Admin   alum & mag hydroxide-simeth (MAALOX/MYLANTA) 200-200-20 MG/5ML suspension 30 mL  30 mL Oral Q4H PRN Oneta Rack, NP       busPIRone (BUSPAR) tablet 10 mg  10 mg Oral TID Rex Kras, MD   10 mg at 03/14/23 2130   haloperidol (HALDOL) tablet 5 mg  5 mg Oral TID PRN Oneta Rack, NP       magnesium hydroxide (MILK OF MAGNESIA) suspension 30 mL  30 mL Oral Daily PRN Oneta Rack, NP       nicotine (NICODERM CQ - dosed in mg/24 hours) patch 21 mg  21 mg Transdermal Daily Maryagnes Amos, FNP   21 mg at 03/14/23 1148   QUEtiapine (SEROQUEL) tablet 25 mg  25 mg Oral Q6H PRN Shardee Dieu, MD       QUEtiapine (SEROQUEL) tablet 50 mg  50 mg Oral QHS Wanda Rideout, MD       venlafaxine XR (EFFEXOR-XR) 24 hr capsule 37.5 mg  37.5 mg Oral Q breakfast Oneta Rack, NP   37.5 mg at 03/14/23 8657   Current Outpatient Medications  Medication Sig Dispense  Refill   ALPRAZolam (XANAX) 1 MG tablet Take 1 tablet (1 mg total) by mouth 2 (two) times daily as needed for up to 12 doses for anxiety. (Patient not taking: Reported on 03/13/2023) 12 tablet 0    Labs  Lab Results:  Admission on 03/13/2023, Discharged on 03/13/2023  Component Date Value Ref Range Status   Sodium 03/13/2023 135  135 - 145 mmol/L Final   Potassium 03/13/2023 3.9  3.5 - 5.1 mmol/L Final   Chloride 03/13/2023 99  98 - 111 mmol/L Final   CO2 03/13/2023 28  22 - 32 mmol/L Final   Glucose, Bld 03/13/2023 120 (H)  70 - 99 mg/dL Final   Glucose reference range applies only to samples taken after fasting for at least 8 hours.   BUN 03/13/2023 5 (L)  6 - 20 mg/dL Final    Creatinine, Ser 03/13/2023 0.70  0.61 - 1.24 mg/dL Final   Calcium 98/12/9145 8.7 (L)  8.9 - 10.3 mg/dL Final   Total Protein 82/95/6213 7.1  6.5 - 8.1 g/dL Final   Albumin 08/65/7846 4.3  3.5 - 5.0 g/dL Final   AST 96/29/5284 22  15 - 41 U/L Final   ALT 03/13/2023 11  0 - 44 U/L Final   Alkaline Phosphatase 03/13/2023 55  38 - 126 U/L Final   Total Bilirubin 03/13/2023 0.8  0.0 - 1.2 mg/dL Final   GFR, Estimated 03/13/2023 >60  >60 mL/min Final   Comment: (NOTE) Calculated using the CKD-EPI Creatinine Equation (2021)    Anion gap 03/13/2023 8  5 - 15 Final   Performed at [facility] [address], [place], [place] [address]   Alcohol, Ethyl (B) 03/13/2023 <10  <10 mg/dL Final   Comment: (NOTE) Lowest detectable limit for serum alcohol is 10 mg/dL.  For medical purposes only. Performed at [facility], [address] [place], [place] [address]    WBC 03/13/2023 10.9 (H)  4.0 - 10.5 K/uL Final   RBC 03/13/2023 4.81  4.22 - 5.81 MIL/uL Final   Hemoglobin 03/13/2023 14.8  13.0 - 17.0 g/dL Final   HCT 25/36/6440 43.5  39.0 - 52.0 % Final   MCV 03/13/2023 90.4  80.0 - 100.0 fL Final   MCH 03/13/2023 30.8  26.0 - 34.0 pg Final   MCHC 03/13/2023 34.0  30.0 - 36.0 g/dL Final   RDW 34/74/2595 12.2  11.5 - 15.5 % Final   Platelets 03/13/2023 288  150 - 400 K/uL Final   nRBC 03/13/2023 0.0  0.0 - 0.2 % Final   Performed at [facility], [address] [place], [place] [address]   Opiates 03/13/2023 NONE DETECTED  NONE DETECTED Final   Cocaine 03/13/2023 NONE DETECTED  NONE DETECTED Final   Benzodiazepines 03/13/2023 POSITIVE (A)  NONE DETECTED Final   Amphetamines 03/13/2023 NONE DETECTED  NONE DETECTED Final   Tetrahydrocannabinol 03/13/2023 POSITIVE (A)  NONE DETECTED Final   Barbiturates 03/13/2023 NONE DETECTED  NONE DETECTED Final   Comment: (NOTE) DRUG SCREEN FOR MEDICAL PURPOSES ONLY.  IF CONFIRMATION IS NEEDED FOR  ANY PURPOSE, NOTIFY LAB WITHIN 5 DAYS.  LOWEST DETECTABLE LIMITS FOR URINE DRUG SCREEN Drug Class                     Cutoff (ng/mL) Amphetamine and metabolites    1000 Barbiturate and metabolites    200 Benzodiazepine  200 Opiates and metabolites        300 Cocaine and metabolites        300 THC                            50 Performed at [facility], [address], [place], [place] [address]   Admission on 11/15/2022, Discharged on 11/25/2022  Component Date Value Ref Range Status   Sodium 11/19/2022 135  135 - 145 mmol/L Final   Potassium 11/19/2022 3.8  3.5 - 5.1 mmol/L Final   Chloride 11/19/2022 98  98 - 111 mmol/L Final   CO2 11/19/2022 24  22 - 32 mmol/L Final   Glucose, Bld 11/19/2022 96  70 - 99 mg/dL Final   Glucose reference range applies only to samples taken after fasting for at least 8 hours.   BUN 11/19/2022 9  6 - 20 mg/dL Final   Creatinine, Ser 11/19/2022 0.75  0.61 - 1.24 mg/dL Final   Calcium 19/14/7829 8.9  8.9 - 10.3 mg/dL Final   GFR, Estimated 11/19/2022 >60  >60 mL/min Final   Comment: (NOTE) Calculated using the CKD-EPI Creatinine Equation (2021)    Anion gap 11/19/2022 13  5 - 15 Final   Performed at [facility], [address] [place], [place] [address]   WBC 11/19/2022 8.9  4.0 - 10.5 K/uL Final   RBC 11/19/2022 4.56  4.22 - 5.81 MIL/uL Final   Hemoglobin 11/19/2022 14.3  13.0 - 17.0 g/dL Final   HCT 08/65/7846 43.3  39.0 - 52.0 % Final   MCV 11/19/2022 95.0  80.0 - 100.0 fL Final   MCH 11/19/2022 31.4  26.0 - 34.0 pg Final   MCHC 11/19/2022 33.0  30.0 - 36.0 g/dL Final   RDW 96/29/5284 12.2  11.5 - 15.5 % Final   Platelets 11/19/2022 337  150 - 400 K/uL Final   nRBC 11/19/2022 0.0  0.0 - 0.2 % Final   Performed at [facility], [address] [place], [place] [address]   Glucose-Capillary 11/19/2022 98  70 - 99 mg/dL Final   Glucose reference range applies only to samples taken after fasting  for at least 8 hours.  Admission on 11/14/2022, Discharged on 11/15/2022  Component Date Value Ref Range Status   Sodium 11/14/2022 136  135 - 145 mmol/L Final   Potassium 11/14/2022 4.0  3.5 - 5.1 mmol/L Final   Chloride 11/14/2022 100  98 - 111 mmol/L Final   CO2 11/14/2022 27  22 - 32 mmol/L Final   Glucose, Bld 11/14/2022 117 (H)  70 - 99 mg/dL Final   Glucose reference range applies only to samples taken after fasting for at least 8 hours.   BUN 11/14/2022 7  6 - 20 mg/dL Final   Creatinine, Ser 11/14/2022 0.84  0.61 - 1.24 mg/dL Final   Calcium 02/10/7251 8.8 (L)  8.9 - 10.3 mg/dL Final   Total Protein 66/44/0347 7.7  6.5 - 8.1 g/dL Final   Albumin 42/59/5638 4.2  3.5 - 5.0 g/dL Final   AST 75/64/3329 12 (L)  15 - 41 U/L Final   ALT 11/14/2022 11  0 - 44 U/L Final   Alkaline Phosphatase 11/14/2022 59  38 - 126 U/L Final   Total Bilirubin 11/14/2022 1.4 (H)  0.3 - 1.2 mg/dL Final   GFR, Estimated 11/14/2022 >60  >60 mL/min Final   Comment: (NOTE) Calculated using the  CKD-EPI Creatinine Equation (2021)    Anion gap 11/14/2022 9  5 - 15 Final   Performed at [facility], [address] [place], [place] [address]   Alcohol, Ethyl (B) 11/14/2022 <10  <10 mg/dL Final   Comment: (NOTE) Lowest detectable limit for serum alcohol is 10 mg/dL.  For medical purposes only. Performed at [facility], [address], [place], [place] [address]    WBC 11/14/2022 4.7  4.0 - 10.5 K/uL Final   RBC 11/14/2022 4.59  4.22 - 5.81 MIL/uL Final   Hemoglobin 11/14/2022 14.9  13.0 - 17.0 g/dL Final   HCT 62/95/2841 44.5  39.0 - 52.0 % Final   MCV 11/14/2022 96.9  80.0 - 100.0 fL Final   MCH 11/14/2022 32.5  26.0 - 34.0 pg Final   MCHC 11/14/2022 33.5  30.0 - 36.0 g/dL Final   RDW 32/44/0102 12.8  11.5 - 15.5 % Final   Platelets 11/14/2022 319  150 - 400 K/uL Final   nRBC 11/14/2022 0.0  0.0 - 0.2 % Final   Performed at [facility], [address], [place], [place] [address]   Opiates 11/14/2022 NONE DETECTED  NONE DETECTED Final   Cocaine 11/14/2022 NONE DETECTED  NONE DETECTED Final   Benzodiazepines 11/14/2022 POSITIVE (A)  NONE DETECTED Final   Amphetamines 11/14/2022 NONE DETECTED  NONE DETECTED Final   Tetrahydrocannabinol 11/14/2022 NONE DETECTED  NONE DETECTED Final   Barbiturates 11/14/2022 NONE DETECTED  NONE DETECTED Final   Comment: (NOTE) DRUG SCREEN FOR MEDICAL PURPOSES ONLY.  IF CONFIRMATION IS NEEDED FOR ANY PURPOSE, NOTIFY LAB WITHIN 5 DAYS.  LOWEST DETECTABLE LIMITS FOR URINE DRUG SCREEN Drug Class                     Cutoff (ng/mL) Amphetamine and metabolites    1000 Barbiturate and metabolites    200 Benzodiazepine                 200 Opiates and metabolites        300 Cocaine and metabolites        300 THC                            50 Performed at [facility], [address] [place], [place] [address]     Blood Alcohol level:  Lab Results  Component Value Date   ETH <10 03/13/2023   ETH <10 11/14/2022    Metabolic Disorder Labs: No results found for: "HGBA1C", "MPG" No results found for: "PROLACTIN" No results found for: "CHOL", "TRIG", "HDL", "CHOLHDL", "VLDL", "LDLCALC"  Therapeutic Lab Levels: No results found for: "LITHIUM" No results found for: "VALPROATE" No results found for: "CBMZ"  Physical Findings   AUDIT    Flowsheet Row ED to Hosp-Admission (Discharged) [facility]  Alcohol Use Disorder Identification Test Final Score (AUDIT) 0      PHQ2-9    Flowsheet Row ED from 03/13/2023 in [facility]  PHQ-2 Total Score 4  PHQ-9 Total Score 13      Flowsheet Row ED from 03/13/2023 in [facility] Most recent reading at 03/13/2023 12:27 PM ED from 03/13/2023 in [facility] Emergency Department at [facility] Most recent reading at  03/13/2023  3:02 AM ED from 03/05/2023 in [facility] Emergency Department at [facility] Most recent reading at 03/05/2023 12:02 PM  C-SSRS RISK CATEGORY Low Risk Low Risk No Risk        Musculoskeletal  Strength & Muscle Tone: within normal limits Gait & Station: normal Patient leans: N/A  Psychiatric Specialty Exam  Presentation  General Appearance:  Appropriate for Environment  Eye Contact: Fair  Speech: Normal Rate  Speech Volume: Normal  Handedness: Right   Mood and Affect  Mood: Anxious  Affect: Congruent   Thought Process  Thought Processes: Coherent  Descriptions of Associations:Intact  Orientation:Full (Time, Place and Person)  Thought Content:WDL  Diagnosis of Schizophrenia or Schizoaffective disorder in past: No    Hallucinations:Hallucinations: None  Ideas of Reference:None  Suicidal Thoughts:Suicidal Thoughts: Yes, Passive SI Passive Intent and/or Plan: Without Intent; Without Access to Means  Homicidal Thoughts:Homicidal Thoughts: No   Sensorium  Memory: Immediate Fair  Judgment: Fair  Insight: Fair   Art therapist  Concentration: Fair  Attention Span: Fair  Recall: Fair  Fund of Knowledge: Good  Language: Good   Psychomotor Activity  Psychomotor Activity: Psychomotor Activity: Normal   Assets  Assets: Communication Skills; Desire for Improvement   Sleep  Sleep: Sleep: Fair   No data recorded  Physical Exam  Physical exam: Please see exam on admit note. General: Well developed, well nourished.  Pupils: Normal at 3mm Respiratory: Breathing is unlabored.  Cardiovascular: No edema.  Language: No anomia, no aphasia Muscle strength and tone-pt moving all extremities.  Gait not assessed as pt remained in bed.  Neuro: Facial muscles are symmetric. Pt without tremor, no evidence of hyperarousal.  Review of Systems  Constitutional: Negative.   Eyes: Negative.   Respiratory: Negative.     Cardiovascular: Negative.   Gastrointestinal: Negative.   Genitourinary: Negative.   Musculoskeletal: Negative.   Skin: Negative.   Neurological: Negative.   Endo/Heme/Allergies: Negative.   Psychiatric/Behavioral:  Positive for depression and substance abuse. The patient is nervous/anxious and has insomnia.    Blood pressure 121/73, pulse 68, temperature 98.1 F (36.7 C), temperature source Oral, resp. rate 18, SpO2 100%. There is no height or weight on file to calculate BMI.  Treatment Plan Summary: Daily contact with patient to assess and evaluate symptoms and progress in treatment, Medication management, and Plan - start Seroquel 50 mg at bedtime + 25 mg q6h-PRN for intrusive memories, insomnia, anxiety; discussed at length r/b/a with patient and he is a poor candidate for BZD and states gabapentin and hydroxyzine have been ineffective (gabapentin is on allergy list as well) - stop hydroxyzine -cont Buspar 10 mg TID -cont Effexor XR 37.5 mg qdaily  Miguel Rota, MD 03/14/2023 12:17 PM

## 2023-03-14 NOTE — Group Note (Unsigned)
Group Topic: Relaxation  Group Date: 03/14/2023 Start Time: 1210 End Time: 1230 Facilitators: Jenean Lindau, RN  Department: [facility]  Number of Participants: 10  Group Focus: anxiety, coping skills, and relaxation Treatment Modality:  Behavior Modification Therapy Interventions utilized were exploration and group exercise Purpose: enhance coping skills, explore maladaptive thinking, express feelings, and reinforce self-care   Name: Michael [name] Date of Birth: 11-May-1975  MR: [id number]    Level of Participation: {THERAPIES; PSYCH GROUP PARTICIPATION WUJWJ:19147} Quality of Participation: {THERAPIES; PSYCH QUALITY OF PARTICIPATION:23992} Interactions with others: {THERAPIES; PSYCH INTERACTIONS:23993} Mood/Affect: {THERAPIES; PSYCH MOOD/AFFECT:23994} Triggers (if applicable): *** Cognition: {THERAPIES; PSYCH COGNITION:23995} Progress: {THERAPIES; PSYCH PROGRESS:23997} Response: *** Plan: {THERAPIES; PSYCH WGNF:62130}  Patients Problems:  Patient Active Problem List   Diagnosis Date Noted   MDD (major depressive disorder) 03/13/2023   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

## 2023-03-14 NOTE — ED Notes (Signed)
Patient was awake on unit.  He ate breakfast and returned to bed.  Patient is calm and cooperative with care.  He denies avh shi or plan.  Patient guarded and minimally interactive.  No somatic distress.  Will monitor.

## 2023-03-14 NOTE — ED Notes (Signed)
Pt is in the bedroom calm and sleeping. NAD Respirations are even and unlabored. Will continue to monitor for safety.

## 2023-03-14 NOTE — Group Note (Signed)
Group Topic: Positive Affirmations  Group Date: 03/14/2023 Start Time: 1945 End Time: 2045 Facilitators: Darin Engels  Department: [facility]  Number of Participants: 3  Group Focus: acceptance, affirmation, self-awareness, and self-esteem Treatment Modality:  Patient-Centered Therapy Interventions utilized were leisure development, story telling, and support Purpose: express feelings and regain self-worth  Name: Michael [name] Date of Birth: [birth year]  MR: [id number]    Level of Participation: active Quality of Participation: attentive and cooperative Interactions with others: gave feedback Mood/Affect: appropriate and positive Triggers (if applicable): n/a Cognition: coherent/clear Progress: Gaining insight Response: pt was very supportive of other group participants and engaged by asking questions.  Plan: patient will be encouraged to continue attending groups  Patients Problems:  Patient Active Problem List   Diagnosis Date Noted   MDD (major depressive disorder) 03/13/2023   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

## 2023-03-14 NOTE — ED Notes (Signed)
 Pt is in the dayroom watching TV with peers. Pt denies SI/HI/AVH. Pt has no further complain.No acute distress noted. Will continue to monitor for safety and provide support.

## 2023-03-15 DIAGNOSIS — F332 Major depressive disorder, recurrent severe without psychotic features: Secondary | ICD-10-CM | POA: Diagnosis not present

## 2023-03-15 MED ORDER — QUETIAPINE FUMARATE 100 MG PO TABS
100.0000 mg | ORAL_TABLET | Freq: Every day | ORAL | Status: DC
  Administered 2023-03-15 – 2023-03-19 (×5): 100 mg via ORAL
  Filled 2023-03-15 (×5): qty 1

## 2023-03-15 MED ORDER — QUETIAPINE FUMARATE 50 MG PO TABS
50.0000 mg | ORAL_TABLET | Freq: Four times a day (QID) | ORAL | Status: DC | PRN
  Administered 2023-03-15 – 2023-03-20 (×6): 50 mg via ORAL
  Filled 2023-03-15 (×6): qty 1

## 2023-03-15 MED ORDER — VENLAFAXINE HCL ER 75 MG PO CP24
75.0000 mg | ORAL_CAPSULE | Freq: Every day | ORAL | Status: DC
  Administered 2023-03-16 – 2023-03-20 (×5): 75 mg via ORAL
  Filled 2023-03-15 (×5): qty 1

## 2023-03-15 NOTE — ED Notes (Signed)
 Pt was provided dinner.

## 2023-03-15 NOTE — ED Notes (Signed)
 Patient reported to writer that he was feeling anxious,  he was tearful and stated he felt overwhelmed.  Patient offered and accepted 50mg  serroquel PO PRN.  Writer then spoke to patient 1:1  and reviewed coping techniques to manage anxiety.  Patient reported feeling less anxious after we spoke.

## 2023-03-15 NOTE — ED Notes (Addendum)
Patient is lying in bed quietly, in no acute distress, will continue to monitor patient for safety.

## 2023-03-15 NOTE — ED Provider Notes (Signed)
 Behavioral Health Progress Note  Date and Time: 03/15/2023 11:56 AM Name: Michael [name] MRN:  [id number]  Subjective:  Patient reports high anxiety and insomnia despite taking 75 mg of Seroquel  last night, states he was finally able to fall asleep after additional dose yesterday and would like to try an increased dose as states he feels a little better today. Patient was interested in starting IOP program on discharge.  States he has been having anxiety attacks since tapering off of Xanax  back in October after taking it a number of years. Patient reports passive thoughts of death but states he feels a little  better today. Denies SI, plan or intent. Denies HI.  Patient without tremor, nausea or vomiting. States last regular use  of benzos was in October 2024 but that he has felt very high anxiety and dread since coming off Benzos.  Diagnosis:  Final diagnoses:  MDD (major depressive disorder), recurrent severe, without psychosis (HCC)  GAD (generalized anxiety disorder)  Trauma and stressor-related disorder  History of benzodiazepine dependence  Total Time spent with patient: 20 minutes  Past Psychiatric History: The patient has a longstanding history of major depression, generalized anxiety disorder and panic attacks.  His previous admission to [facility] was in October.  He was discharged with outpatient follow-up and was placed on BuSpar  10 mg twice a day, trazodone  50 mg at bedtime and Effexor  XR 150 mg a day.  Patient essentially stopped all the medications.   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 Social History: Patient is currently single.  He is homeless and lives in an self storage facility.  He was married once but has been divorced for over 10 years.  He has a daughter age 33 but states that he has not seen her in a long time and she lives in [place].  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                         Sleep: Poor  Appetite:  Poor  Current Medications:  Current Facility-Administered Medications  Medication Dose Route Frequency Provider Last Rate Last Admin   alum & mag hydroxide-simeth (MAALOX/MYLANTA) 200-200-20 MG/5ML suspension 30 mL  30 mL Oral Q4H PRN Lewis, Tanika N, NP       busPIRone  (BUSPAR ) tablet 10 mg  10 mg Oral TID Goli, Veeraindar, MD   10 mg at 03/15/23 9096   haloperidol  (HALDOL ) tablet 5 mg  5 mg Oral TID PRN Lewis, Tanika N, NP       magnesium  hydroxide (MILK OF MAGNESIA) suspension 30 mL  30 mL Oral Daily PRN Lewis, Tanika N, NP       nicotine  (NICODERM CQ  - dosed in mg/24 hours) patch 21 mg  21 mg Transdermal Daily Wilkie Majel RAMAN, FNP   21 mg at 03/15/23 9096   QUEtiapine  (SEROQUEL ) tablet 100 mg  100 mg Oral QHS Eddie Payette, MD       QUEtiapine  (SEROQUEL ) tablet 50 mg  50 mg Oral Q6H PRN Hamdi Kley, MD       [START ON 03/16/2023] venlafaxine  XR (EFFEXOR -XR) 24 hr capsule 75 mg  75 mg Oral Q breakfast Jasier Calabretta, MD       Current Outpatient Medications  Medication Sig Dispense Refill   ALPRAZolam  (XANAX )  1 MG tablet Take 1 tablet (1 mg total) by mouth 2 (two) times daily as needed for up to 12 doses for anxiety. (Patient not taking: Reported on 03/13/2023) 12 tablet 0    Labs  Lab Results:  Admission on 03/13/2023, Discharged on 03/13/2023  Component Date Value Ref Range Status   Sodium 03/13/2023 135  135 - 145 mmol/L Final   Potassium 03/13/2023 3.9  3.5 - 5.1 mmol/L Final   Chloride 03/13/2023 99  98 - 111 mmol/L Final   CO2 03/13/2023 28  22 - 32 mmol/L Final   Glucose, Bld 03/13/2023 120 (H)  70 - 99 mg/dL Final   Glucose reference range applies only to samples taken after fasting for at least 8 hours.   BUN 03/13/2023 5 (L)  6 - 20 mg/dL Final   Creatinine, Ser 03/13/2023 0.70  0.61 - 1.24 mg/dL Final   Calcium  97/97/7974 8.7 (L)  8.9 - 10.3 mg/dL Final   Total Protein 97/97/7974 7.1  6.5 - 8.1 g/dL Final   Albumin 97/97/7974 4.3  3.5 - 5.0 g/dL Final   AST 97/97/7974 22  15 - 41 U/L Final   ALT 03/13/2023 11  0 - 44 U/L Final   Alkaline Phosphatase 03/13/2023 55  38 - 126 U/L Final   Total Bilirubin 03/13/2023 0.8  0.0 - 1.2 mg/dL Final   GFR, Estimated 03/13/2023 >60  >60 mL/min Final   Comment: (NOTE) Calculated using the CKD-EPI Creatinine Equation (2021)    Anion gap 03/13/2023 8  5 - 15 Final   Performed at [facility], [address] [place], [place] [address]   Alcohol, Ethyl (B) 03/13/2023 <10  <10 mg/dL Final   Comment: (NOTE) Lowest detectable limit for serum alcohol is 10 mg/dL.  For medical purposes only. Performed at [facility], [address] [place], [place] [address]    WBC 03/13/2023 10.9 (H)  4.0 - 10.5 K/uL Final   RBC 03/13/2023 4.81  4.22 - 5.81 MIL/uL Final   Hemoglobin 03/13/2023 14.8  13.0 - 17.0 g/dL Final   HCT 97/97/7974 43.5  39.0 - 52.0 % Final   MCV 03/13/2023 90.4  80.0 - 100.0 fL Final   MCH 03/13/2023 30.8  26.0 - 34.0 pg Final   MCHC 03/13/2023 34.0  30.0 - 36.0 g/dL Final   RDW 97/97/7974 12.2  11.5 - 15.5 % Final   Platelets 03/13/2023 288  150 - 400 K/uL Final   nRBC 03/13/2023 0.0  0.0 - 0.2 % Final   Performed at [facility], [address], [place], [place] [address]   Opiates 03/13/2023 NONE DETECTED  NONE DETECTED Final   Cocaine 03/13/2023 NONE DETECTED  NONE DETECTED Final   Benzodiazepines 03/13/2023 POSITIVE (A)  NONE DETECTED Final   Amphetamines 03/13/2023 NONE DETECTED  NONE DETECTED Final   Tetrahydrocannabinol 03/13/2023 POSITIVE (A)  NONE DETECTED Final   Barbiturates 03/13/2023 NONE DETECTED  NONE DETECTED Final   Comment: (NOTE) DRUG SCREEN FOR MEDICAL PURPOSES ONLY.  IF CONFIRMATION IS NEEDED FOR ANY PURPOSE, NOTIFY LAB WITHIN 5 DAYS.  LOWEST DETECTABLE  LIMITS FOR URINE DRUG SCREEN Drug Class                     Cutoff (ng/mL) Amphetamine and metabolites    1000 Barbiturate and metabolites    200 Benzodiazepine                 200 Opiates  and metabolites        300 Cocaine and metabolites        300 THC                            50 Performed at [facility], [address], [place], [place] [address]   Admission on 11/15/2022, Discharged on 11/25/2022  Component Date Value Ref Range Status   Sodium 11/19/2022 135  135 - 145 mmol/L Final   Potassium 11/19/2022 3.8  3.5 - 5.1 mmol/L Final   Chloride 11/19/2022 98  98 - 111 mmol/L Final   CO2 11/19/2022 24  22 - 32 mmol/L Final   Glucose, Bld 11/19/2022 96  70 - 99 mg/dL Final   Glucose reference range applies only to samples taken after fasting for at least 8 hours.   BUN 11/19/2022 9  6 - 20 mg/dL Final   Creatinine, Ser 11/19/2022 0.75  0.61 - 1.24 mg/dL Final   Calcium 89/88/7975 8.9  8.9 - 10.3 mg/dL Final   GFR, Estimated 11/19/2022 >60  >60 mL/min Final   Comment: (NOTE) Calculated using the CKD-EPI Creatinine Equation (2021)    Anion gap 11/19/2022 13  5 - 15 Final   Performed at [facility], [address] [place], [place] [address]   WBC 11/19/2022 8.9  4.0 - 10.5 K/uL Final   RBC 11/19/2022 4.56  4.22 - 5.81 MIL/uL Final   Hemoglobin 11/19/2022 14.3  13.0 - 17.0 g/dL Final   HCT 89/88/7975 43.3  39.0 - 52.0 % Final   MCV 11/19/2022 95.0  80.0 - 100.0 fL Final   MCH 11/19/2022 31.4  26.0 - 34.0 pg Final   MCHC 11/19/2022 33.0  30.0 - 36.0 g/dL Final   RDW 89/88/7975 12.2  11.5 - 15.5 % Final   Platelets 11/19/2022 337  150 - 400 K/uL Final   nRBC 11/19/2022 0.0  0.0 - 0.2 % Final   Performed at [facility], [address] [place], [place] [address]   Glucose-Capillary 11/19/2022 98  70 - 99 mg/dL Final   Glucose reference range applies only to samples taken after fasting for at least 8 hours.  Admission on 11/14/2022, Discharged on  11/15/2022  Component Date Value Ref Range Status   Sodium 11/14/2022 136  135 - 145 mmol/L Final   Potassium 11/14/2022 4.0  3.5 - 5.1 mmol/L Final   Chloride 11/14/2022 100  98 - 111 mmol/L Final   CO2 11/14/2022 27  22 - 32 mmol/L Final   Glucose, Bld 11/14/2022 117 (H)  70 - 99 mg/dL Final   Glucose reference range applies only to samples taken after fasting for at least 8 hours.   BUN 11/14/2022 7  6 - 20 mg/dL Final   Creatinine, Ser 11/14/2022 0.84  0.61 - 1.24 mg/dL Final   Calcium 89/93/7975 8.8 (L)  8.9 - 10.3 mg/dL Final   Total Protein 89/93/7975 7.7  6.5 - 8.1 g/dL Final   Albumin 89/93/7975 4.2  3.5 - 5.0 g/dL Final   AST 89/93/7975 12 (L)  15 - 41 U/L Final   ALT 11/14/2022 11  0 - 44 U/L Final   Alkaline Phosphatase 11/14/2022 59  38 - 126 U/L Final   Total Bilirubin 11/14/2022 1.4 (H)  0.3 - 1.2 mg/dL Final   GFR, Estimated 11/14/2022 >60  >60 mL/min Final   Comment: (NOTE) Calculated using the CKD-EPI Creatinine  Equation (2021)    Anion gap 11/14/2022 9  5 - 15 Final   Performed at [facility], [address] [place], [place] [address]   Alcohol, Ethyl (B) 11/14/2022 <10  <10 mg/dL Final   Comment: (NOTE) Lowest detectable limit for serum alcohol is 10 mg/dL.  For medical purposes only. Performed at [facility], [address] [place], [place] [address]    WBC 11/14/2022 4.7  4.0 - 10.5 K/uL Final   RBC 11/14/2022 4.59  4.22 - 5.81 MIL/uL Final   Hemoglobin 11/14/2022 14.9  13.0 - 17.0 g/dL Final   HCT 89/93/7975 44.5  39.0 - 52.0 % Final   MCV 11/14/2022 96.9  80.0 - 100.0 fL Final   MCH 11/14/2022 32.5  26.0 - 34.0 pg Final   MCHC 11/14/2022 33.5  30.0 - 36.0 g/dL Final   RDW 89/93/7975 12.8  11.5 - 15.5 % Final   Platelets 11/14/2022 319  150 - 400 K/uL Final   nRBC 11/14/2022 0.0  0.0 - 0.2 % Final   Performed at [facility], [address] [place], [place] [address]   Opiates 11/14/2022  NONE DETECTED  NONE DETECTED Final   Cocaine 11/14/2022 NONE DETECTED  NONE DETECTED Final   Benzodiazepines 11/14/2022 POSITIVE (A)  NONE DETECTED Final   Amphetamines 11/14/2022 NONE DETECTED  NONE DETECTED Final   Tetrahydrocannabinol 11/14/2022 NONE DETECTED  NONE DETECTED Final   Barbiturates 11/14/2022 NONE DETECTED  NONE DETECTED Final   Comment: (NOTE) DRUG SCREEN FOR MEDICAL PURPOSES ONLY.  IF CONFIRMATION IS NEEDED FOR ANY PURPOSE, NOTIFY LAB WITHIN 5 DAYS.  LOWEST DETECTABLE LIMITS FOR URINE DRUG SCREEN Drug Class                     Cutoff (ng/mL) Amphetamine and metabolites    1000 Barbiturate and metabolites    200 Benzodiazepine                 200 Opiates and metabolites        300 Cocaine and metabolites        300 THC                            50 Performed at [facility], [address], [place], [place] [address]     Blood Alcohol level:  Lab Results  Component Value Date   ETH <10 03/13/2023   ETH <10 11/14/2022    Metabolic Disorder Labs: No results found for: HGBA1C, MPG No results found for: PROLACTIN No results found for: CHOL, TRIG, HDL, CHOLHDL, VLDL, LDLCALC  Therapeutic Lab Levels: No results found for: LITHIUM No results found for: VALPROATE No results found for: CBMZ  Physical Findings   AUDIT    Flowsheet Row ED to Hosp-Admission (Discharged) [facility]  Alcohol Use Disorder Identification Test Final Score (AUDIT) 0      PHQ2-9    Flowsheet Row ED from 03/13/2023 in [facility]  PHQ-2 Total Score 4  PHQ-9 Total Score 13      Flowsheet Row ED from 03/13/2023 in [facility] Most recent reading at 03/13/2023 12:27 PM ED from 03/13/2023 in [facility] Emergency Department at [facility] Most recent reading at 03/13/2023  3:02 AM ED from 03/05/2023 in [facility] Emergency  Department at [facility] Most  recent reading at 03/05/2023 12:02 PM  C-SSRS RISK CATEGORY Low Risk Low Risk No Risk        Musculoskeletal  Strength & Muscle Tone: within normal limits Gait & Station: normal Patient leans: N/A  Psychiatric Specialty Exam  Presentation  General Appearance:  Appropriate for Environment  Eye Contact: Fair  Speech: Normal Rate  Speech Volume: Normal  Handedness: Right   Mood and Affect  Mood: Anxious  Affect: Congruent   Thought Process  Thought Processes: Coherent  Descriptions of Associations:Intact  Orientation:Full (Time, Place and Person)  Thought Content:WDL  Diagnosis of Schizophrenia or Schizoaffective disorder in past: No    Hallucinations:Hallucinations: None  Ideas of Reference:None  Suicidal Thoughts:Suicidal Thoughts: Yes, Passive SI Passive Intent and/or Plan: Without Intent; Without Access to Means  Homicidal Thoughts:Homicidal Thoughts: No   Sensorium  Memory: Immediate Fair  Judgment: Fair  Insight: Fair   Art Therapist  Concentration: Fair  Attention Span: Fair  Recall: Fair  Fund of Knowledge: Good  Language: Good   Psychomotor Activity  Psychomotor Activity: Psychomotor Activity: Normal   Assets  Assets: Communication Skills; Desire for Improvement   Sleep  Sleep: No data recorded   No data recorded  Physical Exam  Physical exam: Please see exam on admit note. General: Well developed, well nourished.  Pupils: Normal at 3mm Respiratory: Breathing is unlabored.  Cardiovascular: No edema.  Language: No anomia, no aphasia Muscle strength and tone-pt moving all extremities.  Gait not assessed as pt remained in bed.  Neuro: Facial muscles are symmetric. Pt without tremor, no evidence of hyperarousal.  Review of Systems  Constitutional: Negative.   Eyes: Negative.   Respiratory: Negative.    Cardiovascular: Negative.   Gastrointestinal:  Negative.   Genitourinary: Negative.   Musculoskeletal: Negative.   Skin: Negative.   Neurological: Negative.   Endo/Heme/Allergies: Negative.   Psychiatric/Behavioral:  Positive for depression and substance abuse. The patient is nervous/anxious and has insomnia.    Blood pressure 122/68, pulse 66, temperature 97.6 F (36.4 C), temperature source Oral, resp. rate 19, SpO2 99%. There is no height or weight on file to calculate BMI.  Treatment Plan Summary: Daily contact with patient to assess and evaluate symptoms and progress in treatment, Medication management, and Plan - increase Seroquel  to 100 mg at bedtime for PTSD symptoms and as augmenting agent for Effexor  + 50mg  q6h-PRN for intrusive memories, insomnia, anxiety; discussed at length r/b/a with patient and he is a poor candidate for BZD and states gabapentin  and hydroxyzine  have been ineffective (gabapentin  is on allergy list as well) - stop hydroxyzine  -cont Buspar  10 mg TID -increase Effexor  XR to 75 mg qdaily  Nakira Litzau, MD 03/15/2023 11:56 AM

## 2023-03-15 NOTE — ED Notes (Signed)
Patient visible at interval and calm on approach. Denies si/hi/avh. Denies symptoms of withdrawing. +Bms. Appetite if fair. Medication adherent. Offers no concerns at this time. Will continue to monitor and report changes as noted. Safety maintained.

## 2023-03-15 NOTE — ED Notes (Signed)
 Pt was provided lunch

## 2023-03-15 NOTE — ED Notes (Signed)
Patient continues to experience periods of anxiety which is relieved by talking to staff.  Writer has given him multiple suggestions for techniques to manage periods of anxiety.  He has responded well to learning about how to manage his feelings.

## 2023-03-15 NOTE — ED Notes (Signed)
Patient is sitting in day room with other patient quietly watching television.

## 2023-03-15 NOTE — Group Note (Signed)
 Group Topic: Recovery Basics  Group Date: 03/15/2023 Start Time: 0800 End Time: 0830 Facilitators: Judi Monico RAMAN, NT  Department: [facility]  Number of Participants: 5  Group Focus: check in Treatment Modality:  Psychoeducation Interventions utilized were exploration, problem solving, and support Purpose: enhance coping skills, express feelings, express irrational fears, increase insight, and regain self-worth  Name: TIERRE NETTO Date of Birth: Jun 25, 1975  MR: [id number]    Level of Participation: active Quality of Participation: attentive and motivated Interactions with others: gave feedback Mood/Affect: anxious and positive Triggers (if applicable): n/a Cognition: coherent/clear and goal directed Progress: Moderate Response: Pt was able to express how he is feeling this morning and shared his goals for the day. Plan: patient will be encouraged to attend future groups.  Patients Problems:  Patient Active Problem List   Diagnosis Date Noted   MDD (major depressive disorder) 03/13/2023   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

## 2023-03-15 NOTE — Group Note (Signed)
 Group Topic: Fears and Unhealthy Coping Skills  Group Date: 03/15/2023 Start Time: 1200 End Time: 1220 Facilitators: Stanly Stabile, RN  Department: [facility]  Number of Participants: 6  Group Focus: anxiety, clarity of thought, and coping skills Treatment Modality:  Behavior Modification Therapy Interventions utilized were clarification, exploration, and patient education Purpose: explore maladaptive thinking, express feelings, express irrational fears, and improve communication skills  Name: Michael [name] Date of Birth: 11/01/1975  MR: [id number]    Level of Participation: active Quality of Participation: attentive, cooperative, and engaged Interactions with others: gave feedback Mood/Affect: anxious Triggers (if applicable):   Cognition: coherent/clear, goal directed, and logical Progress: Gaining insight Response:   Plan: follow-up needed  Patients Problems:  Patient Active Problem List   Diagnosis Date Noted   MDD (major depressive disorder) 03/13/2023   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

## 2023-03-15 NOTE — Group Note (Signed)
 Group Topic: Understanding Self  Group Date: 03/15/2023 Start Time: 1500 End Time: 1623 Facilitators: Reginald Mangels, Cloyd SAUNDERS, RN  Department: [facility]  Number of Participants: 5  Group Focus: anxiety and nursing group Treatment Modality:  Leisure Development Interventions utilized were group exercise, patient education, and story telling Purpose: express feelings, increase insight, and reinforce self-care  Name: MARTINEZ BOXX Date of Birth: 13-Nov-1975  MR: [id number]    Level of Participation: active Quality of Participation: cooperative and engaged Interactions with others: gave feedback Mood/Affect: appropriate and brightens with interaction Triggers (if applicable): thoughts of his old life Cognition: goal directed Progress: Gaining insight Response: effective Plan: follow-up needed  Patients Problems:  Patient Active Problem List   Diagnosis Date Noted   MDD (major depressive disorder) 03/13/2023   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

## 2023-03-15 NOTE — ED Notes (Signed)
 Patient is sleeping. Respirations equal and unlabored, skin warm and dry. No change in assessment or acuity. Routine safety checks conducted according to facility protocol. Will continue to monitor for safety.

## 2023-03-15 NOTE — Group Note (Signed)
 Group Topic: Overcoming Obstacles  Group Date: 03/15/2023 Start Time: 2005 End Time: 2055 Facilitators: Mariel Chatters  Department: [facility]  Number of Participants: 3  Group Focus: acceptance, coping skills, depression, family, forgiveness, and self-awareness Treatment Modality:  Leisure Development Interventions utilized were leisure development, reminiscence, story telling, and support Purpose: enhance coping skills, express feelings, and increase insight  Name: Michael [name] Date of Birth: 1975/02/29  MR: [id number]    Level of Participation: active Quality of Participation: attentive, cooperative, engaged, and supportive Interactions with others: positive  Mood/Affect: appropriate and positive Triggers (if applicable): n/a Cognition: coherent/clear Progress: Gaining insight Response: pt offered support and positive affirmations to other group participants.  Plan: patient will be encouraged to continue attending groups   Patients Problems:  Patient Active Problem List   Diagnosis Date Noted   MDD (major depressive disorder) 03/13/2023   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

## 2023-03-15 NOTE — ED Notes (Signed)
Patient is requesting anxiety medication. This nurse advised that hydroxyzine has been discontinued but he does have seroquel.

## 2023-03-15 NOTE — Care Management (Signed)
[facility] Care Management   Writer met with the patient. Patient requests PHP group services due to his increased anxiety.  Writer put in a referral for CD-IOP services with [facility].

## 2023-03-16 DIAGNOSIS — F332 Major depressive disorder, recurrent severe without psychotic features: Secondary | ICD-10-CM | POA: Diagnosis not present

## 2023-03-16 NOTE — Group Note (Signed)
 Group Topic: Recovery Basics  Group Date: 03/16/2023 Start Time: 1345 End Time: 1501 Facilitators: Judi Monico RAMAN, NT  Department: [facility]  Number of Participants: 6  Group Focus: check in Treatment Modality:  Psychoeducation Interventions utilized were group exercise Purpose: AA  Name: Michael [name] Date of Birth: 02-Apr-1975  MR: [id number]    Level of Participation: active Quality of Participation: engaged Interactions with others: gave feedback Mood/Affect: appropriate Triggers (if applicable): n/a Cognition: coherent/clear Progress: Moderate Response: Pt was very engaged during AA group Plan: patient will be encouraged to attend future groups  Patients Problems:  Patient Active Problem List   Diagnosis Date Noted   MDD (major depressive disorder) 03/13/2023   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

## 2023-03-16 NOTE — ED Notes (Signed)
 Patient sitting in bedroom, calm and collected. No acute distress noted. No concerns voiced. Informed patient to notify staff with any needs or assistance. Patient verbalized understanding or agreement. Safety checks in place per facility policy.

## 2023-03-16 NOTE — ED Notes (Addendum)
 Patient resting with eyes closed in no apparent acute distress. Respirations even and unlabored. Environment secured. Safety checks in place according to facility policy.

## 2023-03-16 NOTE — ED Provider Notes (Signed)
 Behavioral Health Progress Note  Date and Time: 03/16/2023 6:49 PM Name: Michael [name] MRN:  [id number]  Subjective:  Patient reports high anxiety but improved sleep and appetite. Denies feeling suicidal or wanting to be dead but reports racing thoughts still. States he did feel a little dizzy today but would like ot continue with current dose of Seroquel  as reports it is helping. Tolerating medications otherwise. Patient is interested in completing IOP program for anxiety and depression. Denies HI.  Diagnosis:  Final diagnoses:  MDD (major depressive disorder), recurrent severe, without psychosis (HCC)  GAD (generalized anxiety disorder)  Trauma and stressor-related disorder  History of benzodiazepine dependence  Total Time spent with patient: 20 minutes  Past Psychiatric History: The patient has a longstanding history of major depression, generalized anxiety disorder and panic attacks.  His previous admission to [facility] was in October.  He was discharged with outpatient follow-up and was placed on BuSpar  10 mg twice a day, trazodone  50 mg at bedtime and Effexor  XR 150 mg a day.  Patient essentially stopped all the medications.   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 Social History: Patient is currently single.  He is homeless and lives in an self storage facility.  He was married once but has been divorced for over 10 years.  He has a daughter age 58 but states that he has not seen her in a long time and she lives in [place].  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                         Sleep: Fair  Appetite:  Fair  Current Medications:  Current Facility-Administered Medications  Medication Dose Route Frequency Provider Last Rate Last Admin   alum & mag hydroxide-simeth (MAALOX/MYLANTA)  200-200-20 MG/5ML suspension 30 mL  30 mL Oral Q4H PRN Lewis, Tanika N, NP       busPIRone  (BUSPAR ) tablet 10 mg  10 mg Oral TID Goli, Veeraindar, MD   10 mg at 03/16/23 1656   haloperidol  (HALDOL ) tablet 5 mg  5 mg Oral TID PRN Lewis, Tanika N, NP       magnesium  hydroxide (MILK OF MAGNESIA) suspension 30 mL  30 mL Oral Daily PRN Lewis, Tanika N, NP       nicotine  (NICODERM CQ  - dosed in mg/24 hours) patch 21 mg  21 mg Transdermal Daily Wilkie Majel RAMAN, FNP   21 mg at 03/16/23 9044   QUEtiapine  (SEROQUEL ) tablet 100 mg  100 mg Oral QHS Aleyah Balik, MD   100 mg at 03/15/23 2111   QUEtiapine  (SEROQUEL ) tablet 50 mg  50 mg Oral Q6H PRN Lynell Greenhouse, MD   50 mg at 03/16/23 9044   venlafaxine  XR (EFFEXOR -XR) 24 hr capsule 75 mg  75 mg Oral Q breakfast Susan Bleich, MD   75 mg at 03/16/23 9182   Current Outpatient Medications  Medication Sig Dispense Refill   ALPRAZolam  (XANAX ) 1 MG tablet Take 1 tablet (1 mg total) by mouth 2 (two) times daily as needed for up to 12 doses for anxiety. (Patient not taking: Reported on 03/13/2023) 12 tablet 0    Labs  Lab Results:  Admission on 03/13/2023, Discharged on 03/13/2023  Component Date Value Ref Range Status   Sodium 03/13/2023 135  135 -  145 mmol/L Final   Potassium 03/13/2023 3.9  3.5 - 5.1 mmol/L Final   Chloride 03/13/2023 99  98 - 111 mmol/L Final   CO2 03/13/2023 28  22 - 32 mmol/L Final   Glucose, Bld 03/13/2023 120 (H)  70 - 99 mg/dL Final   Glucose reference range applies only to samples taken after fasting for at least 8 hours.   BUN 03/13/2023 5 (L)  6 - 20 mg/dL Final   Creatinine, Ser 03/13/2023 0.70  0.61 - 1.24 mg/dL Final   Calcium 97/97/7974 8.7 (L)  8.9 - 10.3 mg/dL Final   Total Protein 97/97/7974 7.1  6.5 - 8.1 g/dL Final   Albumin 97/97/7974 4.3  3.5 - 5.0 g/dL Final   AST 97/97/7974 22  15 - 41 U/L Final   ALT 03/13/2023 11  0 - 44 U/L Final   Alkaline Phosphatase 03/13/2023 55  38 - 126 U/L Final   Total Bilirubin  03/13/2023 0.8  0.0 - 1.2 mg/dL Final   GFR, Estimated 03/13/2023 >60  >60 mL/min Final   Comment: (NOTE) Calculated using the CKD-EPI Creatinine Equation (2021)    Anion gap 03/13/2023 8  5 - 15 Final   Performed at [facility], [address], [place], [place] [address]   Alcohol, Ethyl (B) 03/13/2023 <10  <10 mg/dL Final   Comment: (NOTE) Lowest detectable limit for serum alcohol is 10 mg/dL.  For medical purposes only. Performed at [facility], [address], [place], [place] [address]    WBC 03/13/2023 10.9 (H)  4.0 - 10.5 K/uL Final   RBC 03/13/2023 4.81  4.22 - 5.81 MIL/uL Final   Hemoglobin 03/13/2023 14.8  13.0 - 17.0 g/dL Final   HCT 97/97/7974 43.5  39.0 - 52.0 % Final   MCV 03/13/2023 90.4  80.0 - 100.0 fL Final   MCH 03/13/2023 30.8  26.0 - 34.0 pg Final   MCHC 03/13/2023 34.0  30.0 - 36.0 g/dL Final   RDW 97/97/7974 12.2  11.5 - 15.5 % Final   Platelets 03/13/2023 288  150 - 400 K/uL Final   nRBC 03/13/2023 0.0  0.0 - 0.2 % Final   Performed at [facility], [address] [place], [place] [address]   Opiates 03/13/2023 NONE DETECTED  NONE DETECTED Final   Cocaine 03/13/2023 NONE DETECTED  NONE DETECTED Final   Benzodiazepines 03/13/2023 POSITIVE (A)  NONE DETECTED Final   Amphetamines 03/13/2023 NONE DETECTED  NONE DETECTED Final   Tetrahydrocannabinol 03/13/2023 POSITIVE (A)  NONE DETECTED Final   Barbiturates 03/13/2023 NONE DETECTED  NONE DETECTED Final   Comment: (NOTE) DRUG SCREEN FOR MEDICAL PURPOSES ONLY.  IF CONFIRMATION IS NEEDED FOR ANY PURPOSE, NOTIFY LAB WITHIN 5 DAYS.  LOWEST DETECTABLE LIMITS FOR URINE DRUG SCREEN Drug Class                     Cutoff (ng/mL) Amphetamine and metabolites    1000 Barbiturate and metabolites    200 Benzodiazepine                 200 Opiates and metabolites        300 Cocaine and metabolites        300 THC                            50 Performed at  [facility], [address] [place], [place]  [address]   Admission on 11/15/2022, Discharged on 11/25/2022  Component Date Value Ref Range Status   Sodium 11/19/2022 135  135 - 145 mmol/L Final   Potassium 11/19/2022 3.8  3.5 - 5.1 mmol/L Final   Chloride 11/19/2022 98  98 - 111 mmol/L Final   CO2 11/19/2022 24  22 - 32 mmol/L Final   Glucose, Bld 11/19/2022 96  70 - 99 mg/dL Final   Glucose reference range applies only to samples taken after fasting for at least 8 hours.   BUN 11/19/2022 9  6 - 20 mg/dL Final   Creatinine, Ser 11/19/2022 0.75  0.61 - 1.24 mg/dL Final   Calcium 89/88/7975 8.9  8.9 - 10.3 mg/dL Final   GFR, Estimated 11/19/2022 >60  >60 mL/min Final   Comment: (NOTE) Calculated using the CKD-EPI Creatinine Equation (2021)    Anion gap 11/19/2022 13  5 - 15 Final   Performed at [facility], [address] [place], [place] [address]   WBC 11/19/2022 8.9  4.0 - 10.5 K/uL Final   RBC 11/19/2022 4.56  4.22 - 5.81 MIL/uL Final   Hemoglobin 11/19/2022 14.3  13.0 - 17.0 g/dL Final   HCT 89/88/7975 43.3  39.0 - 52.0 % Final   MCV 11/19/2022 95.0  80.0 - 100.0 fL Final   MCH 11/19/2022 31.4  26.0 - 34.0 pg Final   MCHC 11/19/2022 33.0  30.0 - 36.0 g/dL Final   RDW 89/88/7975 12.2  11.5 - 15.5 % Final   Platelets 11/19/2022 337  150 - 400 K/uL Final   nRBC 11/19/2022 0.0  0.0 - 0.2 % Final   Performed at [facility], [address] [place], [place] [address]   Glucose-Capillary 11/19/2022 98  70 - 99 mg/dL Final   Glucose reference range applies only to samples taken after fasting for at least 8 hours.  Admission on 11/14/2022, Discharged on 11/15/2022  Component Date Value Ref Range Status   Sodium 11/14/2022 136  135 - 145 mmol/L Final   Potassium 11/14/2022 4.0  3.5 - 5.1 mmol/L Final   Chloride 11/14/2022 100  98 - 111 mmol/L Final   CO2 11/14/2022 27  22 - 32 mmol/L Final   Glucose, Bld 11/14/2022 117 (H)  70 - 99 mg/dL Final    Glucose reference range applies only to samples taken after fasting for at least 8 hours.   BUN 11/14/2022 7  6 - 20 mg/dL Final   Creatinine, Ser 11/14/2022 0.84  0.61 - 1.24 mg/dL Final   Calcium 89/93/7975 8.8 (L)  8.9 - 10.3 mg/dL Final   Total Protein 89/93/7975 7.7  6.5 - 8.1 g/dL Final   Albumin 89/93/7975 4.2  3.5 - 5.0 g/dL Final   AST 89/93/7975 12 (L)  15 - 41 U/L Final   ALT 11/14/2022 11  0 - 44 U/L Final   Alkaline Phosphatase 11/14/2022 59  38 - 126 U/L Final   Total Bilirubin 11/14/2022 1.4 (H)  0.3 - 1.2 mg/dL Final   GFR, Estimated 11/14/2022 >60  >60 mL/min Final   Comment: (NOTE) Calculated using the CKD-EPI Creatinine Equation (2021)    Anion gap 11/14/2022 9  5 - 15 Final   Performed at [facility], [address], [place], [place] [address]   Alcohol, Ethyl (B) 11/14/2022 <10  <10 mg/dL Final   Comment: (NOTE) Lowest detectable limit for serum alcohol is 10 mg/dL.  For medical purposes only. Performed at [facility], [address], [place], [place] [address]    WBC 11/14/2022 4.7  4.0 - 10.5 K/uL Final   RBC 11/14/2022 4.59  4.22 - 5.81 MIL/uL Final   Hemoglobin 11/14/2022 14.9  13.0 - 17.0 g/dL Final   HCT 89/93/7975 44.5  39.0 - 52.0 % Final   MCV 11/14/2022 96.9  80.0 - 100.0 fL Final   MCH 11/14/2022 32.5  26.0 - 34.0 pg Final   MCHC 11/14/2022 33.5  30.0 - 36.0 g/dL Final   RDW 89/93/7975 12.8  11.5 - 15.5 % Final   Platelets 11/14/2022 319  150 - 400 K/uL Final   nRBC 11/14/2022 0.0  0.0 - 0.2 % Final   Performed at [facility], [address], [place], [place] [address]   Opiates 11/14/2022 NONE DETECTED  NONE DETECTED Final   Cocaine 11/14/2022 NONE DETECTED  NONE DETECTED Final   Benzodiazepines 11/14/2022 POSITIVE (A)  NONE DETECTED Final   Amphetamines 11/14/2022 NONE DETECTED  NONE DETECTED Final   Tetrahydrocannabinol 11/14/2022 NONE DETECTED  NONE DETECTED Final   Barbiturates  11/14/2022 NONE DETECTED  NONE DETECTED Final   Comment: (NOTE) DRUG SCREEN FOR MEDICAL PURPOSES ONLY.  IF CONFIRMATION IS NEEDED FOR ANY PURPOSE, NOTIFY LAB WITHIN 5 DAYS.  LOWEST DETECTABLE LIMITS FOR URINE DRUG SCREEN Drug Class                     Cutoff (ng/mL) Amphetamine and metabolites    1000 Barbiturate and metabolites    200 Benzodiazepine                 200 Opiates and metabolites        300 Cocaine and metabolites        300 THC                            50 Performed at [facility], [address] [place], [place] [address]     Blood Alcohol level:  Lab Results  Component Value Date   ETH <10 03/13/2023   ETH <10 11/14/2022    Metabolic Disorder Labs: No results found for: HGBA1C, MPG No results found for: PROLACTIN No results found for: CHOL, TRIG, HDL, CHOLHDL, VLDL, LDLCALC  Therapeutic Lab Levels: No results found for: LITHIUM No results found for: VALPROATE No results found for: CBMZ  Physical Findings   AUDIT    Flowsheet Row ED to Hosp-Admission (Discharged) [facility]  Alcohol Use Disorder Identification Test Final Score (AUDIT) 0      PHQ2-9    Flowsheet Row ED from 03/13/2023 in [facility]  PHQ-2 Total Score 4  PHQ-9 Total Score 13      Flowsheet Row ED from 03/13/2023 in [facility] Most recent reading at 03/13/2023 12:27 PM ED from 03/13/2023 in [facility] Emergency Department at [facility] Most recent reading at 03/13/2023  3:02 AM ED from 03/05/2023 in [facility] Emergency Department at [facility] Most recent reading at 03/05/2023 12:02 PM  C-SSRS RISK CATEGORY Low Risk Low Risk No Risk        Musculoskeletal  Strength & Muscle Tone: within normal limits Gait & Station: normal Patient leans: N/A  Psychiatric Specialty Exam  Presentation  General  Appearance:  Appropriate for Environment  Eye Contact: Fair  Speech: Normal Rate  Speech  Volume: Normal  Handedness: Right   Mood and Affect  Mood: Anxious  Affect: Congruent   Thought Process  Thought Processes: Coherent  Descriptions of Associations:Intact  Orientation:Full (Time, Place and Person)  Thought Content:WDL  Diagnosis of Schizophrenia or Schizoaffective disorder in past: No    Hallucinations:No data recorded  Ideas of Reference:None  Suicidal Thoughts:No data recorded  Homicidal Thoughts:No data recorded   Sensorium  Memory: Immediate Fair  Judgment: Fair  Insight: Fair   Art Therapist  Concentration: Fair  Attention Span: Fair  Recall: Fair  Fund of Knowledge: Good  Language: Good   Psychomotor Activity  Psychomotor Activity: No data recorded   Assets  Assets: Communication Skills; Desire for Improvement   Sleep  Sleep: No data recorded   No data recorded  Physical Exam  Physical exam: Please see exam on admit note. General: Well developed, well nourished.  Pupils: Normal at 3mm Respiratory: Breathing is unlabored.  Cardiovascular: No edema.  Language: No anomia, no aphasia Muscle strength and tone-pt moving all extremities.  Gait not assessed as pt remained in bed.  Neuro: Facial muscles are symmetric. Pt without tremor, no evidence of hyperarousal.  Review of Systems  Constitutional: Negative.   Eyes: Negative.   Respiratory: Negative.    Cardiovascular: Negative.   Gastrointestinal: Negative.   Genitourinary: Negative.   Musculoskeletal: Negative.   Skin: Negative.   Neurological: Negative.   Endo/Heme/Allergies: Negative.   Psychiatric/Behavioral:  Positive for depression and substance abuse. The patient is nervous/anxious and has insomnia.    Blood pressure 114/74, pulse 76, temperature 97.6 F (36.4 C), temperature source Oral, resp. rate 18, SpO2 99%. There is no height or  weight on file to calculate BMI.  Treatment Plan Summary: Daily contact with patient to assess and evaluate symptoms and progress in treatment, Medication management, and Plan - cont Seroquel  100 mg at bedtime for PTSD symptoms and as augmenting agent for Effexor  + 50mg  q6h-PRN for intrusive memories, insomnia, anxiety; discussed at length r/b/a with patient and he is a poor candidate for BZD and states gabapentin  and hydroxyzine  have been ineffective (gabapentin  is on allergy list as well) - stopped hydroxyzine  du to ineffectiveness -cont Buspar  10 mg TID cont Effexor  XR to 75 mg qdaily  Jaquari Reckner, MD 03/16/2023 6:49 PM

## 2023-03-16 NOTE — ED Notes (Signed)
 Patient alert & oriented x4. Denies intent to harm self or others when asked. Denies A/VH. Patient denies any physical complaints when asked. No acute distress noted. Patient pleasant in demeanor. Support and encouragement provided. Routine safety checks conducted per facility protocol. Encouraged patient to notify staff if any thoughts of harm towards self or others arise. Patient verbalizes understanding and agreement.

## 2023-03-16 NOTE — Group Note (Signed)
 Group Topic: Social Support  Group Date: 03/16/2023 Start Time: 1700 End Time: 1715 Facilitators: Ugonna Keirsey, Zane HERO, RN  Department: [facility]  Number of Participants: 1  Group Focus: check in Treatment Modality:  Individual Therapy Interventions utilized were support Purpose: express feelings  Name: Michael [name] Date of Birth: [birth year]  MR: [id number]    Level of Participation: moderate Quality of Participation: cooperative Interactions with others: gave feedback Mood/Affect: appropriate and positive Triggers (if applicable): None identified Cognition: coherent/clear Progress: Gaining insight Response: Patient voiced no immediate concerns, agreeable to voice future concerns to staff Plan: patient will be encouraged to continue to use support from staff  Patients Problems:  Patient Active Problem List   Diagnosis Date Noted   MDD (major depressive disorder) 03/13/2023   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

## 2023-03-16 NOTE — ED Notes (Signed)
 Pt is in the dayroom watching TV with peers. Pt endorses anxiety. Pt denies SI/HI/AVH. Pt has no further complain.No acute distress noted. Will continue to monitor for safety and provide support.

## 2023-03-16 NOTE — ED Notes (Signed)
 Patient reported dizziness to clinical research associate. BP obtained: 126/86, HR 77. Patient denies chest pain, SOB, pain, or amy other concerns. States he feels like he's swaying. Instructed to lay down and alert staff of changes. Patient voiced understanding. Environment secured, safety checks in place per facility policy.

## 2023-03-16 NOTE — ED Notes (Signed)
 Patient is sleeping. Respirations equal and unlabored, skin warm and dry. No change in assessment or acuity. Routine safety checks conducted according to facility protocol. Will continue to monitor for safety.

## 2023-03-16 NOTE — Group Note (Signed)
 Group Topic: Healthy Self Image and Positive Change  Group Date: 03/16/2023 Start Time: 1915 End Time: 2016 Facilitators: Joan Plowman B  Department: [facility]  Number of Participants: 2  Group Focus: abuse issues, activities of daily living skills, check in, chemical dependency issues, communication, coping skills, daily focus, depression, diagnosis education, family, feeling awareness/expression, forgiveness, goals/reality orientation, problem solving, relapse prevention, and self-esteem Treatment Modality:  Individual Therapy, Leisure Development, Solution-Focused Therapy, and Spiritual Interventions utilized were leisure development, patient education, problem solving, story telling, and support Purpose: enhance coping skills, express feelings, increase insight, and regain self-worth  Name: Michael [name] Date of Birth: [birth year]  MR: [id number]    Level of Participation: PT DID NOT ATTEND GROUP Quality of Participation: cooperative Interactions with others: gave feedback Mood/Affect: appropriate Triggers (if applicable): NA Cognition: coherent/clear Progress: None Response: NA Plan: patient will be encouraged to go to groups.   Patients Problems:  Patient Active Problem List   Diagnosis Date Noted   MDD (major depressive disorder) 03/13/2023   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

## 2023-03-16 NOTE — ED Notes (Signed)
 PRN Seroquel  administered along with scheduled medications due to patient reported anxiety. Medications administered with no complications. Environment secured, safety checks in place per facility policy.

## 2023-03-16 NOTE — ED Notes (Signed)
 Pt was provided dinner.

## 2023-03-16 NOTE — ED Notes (Signed)
 Pt was provided breakfast.

## 2023-03-16 NOTE — ED Notes (Signed)
 Pt is currently sleeping, no distress noted, environmental check complete, will continue to monitor patient for safety.

## 2023-03-16 NOTE — ED Notes (Signed)
 Pt was provided lunch

## 2023-03-17 DIAGNOSIS — F411 Generalized anxiety disorder: Secondary | ICD-10-CM

## 2023-03-17 DIAGNOSIS — F439 Reaction to severe stress, unspecified: Secondary | ICD-10-CM | POA: Diagnosis not present

## 2023-03-17 DIAGNOSIS — F332 Major depressive disorder, recurrent severe without psychotic features: Secondary | ICD-10-CM | POA: Diagnosis not present

## 2023-03-17 NOTE — Group Note (Signed)
 Group Topic: Wellness  Group Date: 03/17/2023 Start Time: 1700 End Time: 1720 Facilitators: Sherleen Primmer, RN  Department: [facility]  Number of Participants: 7  Group Focus: relapse prevention and self-awareness Treatment Modality:  Psychoeducation Interventions utilized were exploration, patient education, problem solving, and support Purpose: increase insight and relapse prevention strategies  Name: Michael [name] Date of Birth: 22-Jan-1976  MR: [id number]    Level of Participation: active Quality of Participation: attentive, cooperative, and engaged Interactions with others: gave feedback Mood/Affect: appropriate Triggers (if applicable): none Cognition: coherent/clear, goal directed, and insightful Progress: Gaining insight Response: Pt participated in group and gave positive feedback. Plan: follow-up needed  Patients Problems:  Patient Active Problem List   Diagnosis Date Noted   MDD (major depressive disorder) 03/13/2023   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

## 2023-03-17 NOTE — Group Note (Signed)
 Group Topic: Healthy Self Image and Positive Change  Group Date: 03/17/2023 Start Time: 1145 End Time: 1149 Facilitators: Brien Can B  Department: [facility]  Number of Participants: 6  Group Focus: activities of daily living skills and self-awareness Treatment Modality:  Psychoeducation Interventions utilized were patient education and problem solving Purpose: increase insight and reinforce self-care  Name: Michael [name] Date of Birth: 07/27/1975  MR: [id number]    Level of Participation: did not attend group and did not want to eat lunch Quality of Participation:  Interactions with others: N/A Mood/Affect:  Triggers (if applicable):  Cognition:  Progress:  Response:  Plan: follow-up needed  Patients Problems:  Patient Active Problem List   Diagnosis Date Noted   MDD (major depressive disorder) 03/13/2023   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

## 2023-03-17 NOTE — ED Notes (Signed)
 Pt is in his room resting in bed. Pt denies SI/HI/AVH. No acute distress noted. Will continue to monitor for safety.

## 2023-03-17 NOTE — ED Provider Notes (Signed)
 Behavioral Health Progress Note  Date and Time: 03/17/2023 9:25 PM Name: Michael [name] MRN:  [id number]  Subjective:  Patient reports high anxiety and having panic attacks still today but states the Seroquel  is helping. Patient denies any side effects. Reports fair sleep and appetite. Patient has been participating in groups. Patient interested in enrolling in IOP. Working with case management on IOP placement. States his anxiety has been so debilitating he is unable to leave home at times but would like to do IOP if possible. Tolerating medications otherwise.  Denies HI.  Diagnosis:  Final diagnoses:  MDD (major depressive disorder), recurrent severe, without psychosis (HCC)  GAD (generalized anxiety disorder)  Trauma and stressor-related disorder  History of benzodiazepine dependence  Total Time spent with patient: 20 minutes  Past Psychiatric History: The patient has a longstanding history of major depression, generalized anxiety disorder and panic attacks.  His previous admission to [facility] was in October.  He was discharged with outpatient follow-up and was placed on BuSpar  10 mg twice a day, trazodone  50 mg at bedtime and Effexor  XR 150 mg a day.  Patient essentially stopped all the medications.   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 Social History: Patient is currently single.  He is homeless and lives in an self storage facility.  He was married once but has been divorced for over 10 years.  He has a daughter age 52 but states that he has not seen her in a long time and she lives in [place].  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                         Sleep: Fair  Appetite:  Fair  Current Medications:  Current Facility-Administered Medications  Medication Dose Route Frequency  Provider Last Rate Last Admin   alum & mag hydroxide-simeth (MAALOX/MYLANTA) 200-200-20 MG/5ML suspension 30 mL  30 mL Oral Q4H PRN Lewis, Tanika N, NP       busPIRone  (BUSPAR ) tablet 10 mg  10 mg Oral TID Goli, Veeraindar, MD   10 mg at 03/17/23 1652   haloperidol  (HALDOL ) tablet 5 mg  5 mg Oral TID PRN Ezzard Staci SAILOR, NP       magnesium  hydroxide (MILK OF MAGNESIA) suspension 30 mL  30 mL Oral Daily PRN Lewis, Tanika N, NP       nicotine  (NICODERM CQ  - dosed in mg/24 hours) patch 21 mg  21 mg Transdermal Daily Wilkie Majel RAMAN, FNP   21 mg at 03/17/23 9185   QUEtiapine  (SEROQUEL ) tablet 100 mg  100 mg Oral QHS Laekyn Rayos, MD   100 mg at 03/16/23 2117   QUEtiapine  (SEROQUEL ) tablet 50 mg  50 mg Oral Q6H PRN Jewelz Kobus, MD   50 mg at 03/17/23 1043   venlafaxine  XR (EFFEXOR -XR) 24 hr capsule 75 mg  75 mg Oral Q breakfast Meilah Delrosario, MD   75 mg at 03/17/23 9186   Current Outpatient Medications  Medication Sig Dispense Refill   ALPRAZolam  (XANAX ) 1 MG tablet Take 1 tablet (1 mg total) by mouth 2 (two) times daily as needed for up to 12 doses for anxiety. (Patient not taking: Reported on 03/13/2023) 12 tablet 0    Labs  Lab Results:  Admission on 03/13/2023, Discharged on 03/13/2023  Component Date Value Ref Range Status   Sodium 03/13/2023 135  135 - 145 mmol/L Final   Potassium 03/13/2023 3.9  3.5 - 5.1 mmol/L Final   Chloride 03/13/2023 99  98 - 111 mmol/L Final   CO2 03/13/2023 28  22 - 32 mmol/L Final   Glucose, Bld 03/13/2023 120 (H)  70 - 99 mg/dL Final   Glucose reference range applies only to samples taken after fasting for at least 8 hours.   BUN 03/13/2023 5 (L)  6 - 20 mg/dL Final   Creatinine, Ser 03/13/2023 0.70  0.61 - 1.24 mg/dL Final   Calcium 97/97/7974 8.7 (L)  8.9 - 10.3 mg/dL Final   Total Protein 97/97/7974 7.1  6.5 - 8.1 g/dL Final   Albumin 97/97/7974 4.3  3.5 - 5.0 g/dL Final   AST 97/97/7974 22  15 - 41 U/L Final   ALT 03/13/2023 11  0 - 44 U/L Final    Alkaline Phosphatase 03/13/2023 55  38 - 126 U/L Final   Total Bilirubin 03/13/2023 0.8  0.0 - 1.2 mg/dL Final   GFR, Estimated 03/13/2023 >60  >60 mL/min Final   Comment: (NOTE) Calculated using the CKD-EPI Creatinine Equation (2021)    Anion gap 03/13/2023 8  5 - 15 Final   Performed at [facility], [address], [place], [place] [address]   Alcohol, Ethyl (B) 03/13/2023 <10  <10 mg/dL Final   Comment: (NOTE) Lowest detectable limit for serum alcohol is 10 mg/dL.  For medical purposes only. Performed at [facility], [address], [place], [place] [address]    WBC 03/13/2023 10.9 (H)  4.0 - 10.5 K/uL Final   RBC 03/13/2023 4.81  4.22 - 5.81 MIL/uL Final   Hemoglobin 03/13/2023 14.8  13.0 - 17.0 g/dL Final   HCT 97/97/7974 43.5  39.0 - 52.0 % Final   MCV 03/13/2023 90.4  80.0 - 100.0 fL Final   MCH 03/13/2023 30.8  26.0 - 34.0 pg Final   MCHC 03/13/2023 34.0  30.0 - 36.0 g/dL Final   RDW 97/97/7974 12.2  11.5 - 15.5 % Final   Platelets 03/13/2023 288  150 - 400 K/uL Final   nRBC 03/13/2023 0.0  0.0 - 0.2 % Final   Performed at [facility], [address], [place], [place] [address]   Opiates 03/13/2023 NONE DETECTED  NONE DETECTED Final   Cocaine 03/13/2023 NONE DETECTED  NONE DETECTED Final   Benzodiazepines 03/13/2023 POSITIVE (A)  NONE DETECTED Final   Amphetamines 03/13/2023 NONE DETECTED  NONE DETECTED Final   Tetrahydrocannabinol 03/13/2023 POSITIVE (A)  NONE DETECTED Final   Barbiturates 03/13/2023 NONE DETECTED  NONE DETECTED Final   Comment: (NOTE) DRUG SCREEN FOR MEDICAL PURPOSES ONLY.  IF CONFIRMATION IS NEEDED FOR ANY PURPOSE, NOTIFY LAB WITHIN 5 DAYS.  LOWEST DETECTABLE LIMITS FOR URINE DRUG SCREEN Drug Class                     Cutoff (ng/mL) Amphetamine and metabolites    1000 Barbiturate and metabolites    200 Benzodiazepine                 200 Opiates and metabolites        300 Cocaine  and metabolites        300 THC  50 Performed at [facility], [address] [place], [place] [address]   Admission on 11/15/2022, Discharged on 11/25/2022  Component Date Value Ref Range Status   Sodium 11/19/2022 135  135 - 145 mmol/L Final   Potassium 11/19/2022 3.8  3.5 - 5.1 mmol/L Final   Chloride 11/19/2022 98  98 - 111 mmol/L Final   CO2 11/19/2022 24  22 - 32 mmol/L Final   Glucose, Bld 11/19/2022 96  70 - 99 mg/dL Final   Glucose reference range applies only to samples taken after fasting for at least 8 hours.   BUN 11/19/2022 9  6 - 20 mg/dL Final   Creatinine, Ser 11/19/2022 0.75  0.61 - 1.24 mg/dL Final   Calcium 89/88/7975 8.9  8.9 - 10.3 mg/dL Final   GFR, Estimated 11/19/2022 >60  >60 mL/min Final   Comment: (NOTE) Calculated using the CKD-EPI Creatinine Equation (2021)    Anion gap 11/19/2022 13  5 - 15 Final   Performed at [facility], [address], [place], [place] [address]   WBC 11/19/2022 8.9  4.0 - 10.5 K/uL Final   RBC 11/19/2022 4.56  4.22 - 5.81 MIL/uL Final   Hemoglobin 11/19/2022 14.3  13.0 - 17.0 g/dL Final   HCT 89/88/7975 43.3  39.0 - 52.0 % Final   MCV 11/19/2022 95.0  80.0 - 100.0 fL Final   MCH 11/19/2022 31.4  26.0 - 34.0 pg Final   MCHC 11/19/2022 33.0  30.0 - 36.0 g/dL Final   RDW 89/88/7975 12.2  11.5 - 15.5 % Final   Platelets 11/19/2022 337  150 - 400 K/uL Final   nRBC 11/19/2022 0.0  0.0 - 0.2 % Final   Performed at [facility], [address], [place], [place] [address]   Glucose-Capillary 11/19/2022 98  70 - 99 mg/dL Final   Glucose reference range applies only to samples taken after fasting for at least 8 hours.  Admission on 11/14/2022, Discharged on 11/15/2022  Component Date Value Ref Range Status   Sodium 11/14/2022 136  135 - 145 mmol/L Final   Potassium 11/14/2022 4.0  3.5 - 5.1 mmol/L Final   Chloride 11/14/2022 100  98 - 111 mmol/L Final   CO2 11/14/2022 27  22 -  32 mmol/L Final   Glucose, Bld 11/14/2022 117 (H)  70 - 99 mg/dL Final   Glucose reference range applies only to samples taken after fasting for at least 8 hours.   BUN 11/14/2022 7  6 - 20 mg/dL Final   Creatinine, Ser 11/14/2022 0.84  0.61 - 1.24 mg/dL Final   Calcium 89/93/7975 8.8 (L)  8.9 - 10.3 mg/dL Final   Total Protein 89/93/7975 7.7  6.5 - 8.1 g/dL Final   Albumin 89/93/7975 4.2  3.5 - 5.0 g/dL Final   AST 89/93/7975 12 (L)  15 - 41 U/L Final   ALT 11/14/2022 11  0 - 44 U/L Final   Alkaline Phosphatase 11/14/2022 59  38 - 126 U/L Final   Total Bilirubin 11/14/2022 1.4 (H)  0.3 - 1.2 mg/dL Final   GFR, Estimated 11/14/2022 >60  >60 mL/min Final   Comment: (NOTE) Calculated using the CKD-EPI Creatinine Equation (2021)    Anion gap 11/14/2022 9  5 - 15 Final   Performed at [facility], [address], [place], [place] [address]   Alcohol, Ethyl (B) 11/14/2022 <10  <10 mg/dL Final   Comment: (NOTE) Lowest detectable limit for serum  alcohol is 10 mg/dL.  For medical purposes only. Performed at [facility], [address] [place] [address] [place], [place] [address]    WBC 11/14/2022 4.7  4.0 - 10.5 K/uL Final   RBC 11/14/2022 4.59  4.22 - 5.81 MIL/uL Final   Hemoglobin 11/14/2022 14.9  13.0 - 17.0 g/dL Final   HCT 89/93/7975 44.5  39.0 - 52.0 % Final   MCV 11/14/2022 96.9  80.0 - 100.0 fL Final   MCH 11/14/2022 32.5  26.0 - 34.0 pg Final   MCHC 11/14/2022 33.5  30.0 - 36.0 g/dL Final   RDW 89/93/7975 12.8  11.5 - 15.5 % Final   Platelets 11/14/2022 319  150 - 400 K/uL Final   nRBC 11/14/2022 0.0  0.0 - 0.2 % Final   Performed at [facility], [address] [place], [place] [address]   Opiates 11/14/2022 NONE DETECTED  NONE DETECTED Final   Cocaine 11/14/2022 NONE DETECTED  NONE DETECTED Final   Benzodiazepines 11/14/2022 POSITIVE (A)  NONE DETECTED Final   Amphetamines 11/14/2022 NONE DETECTED  NONE DETECTED Final    Tetrahydrocannabinol 11/14/2022 NONE DETECTED  NONE DETECTED Final   Barbiturates 11/14/2022 NONE DETECTED  NONE DETECTED Final   Comment: (NOTE) DRUG SCREEN FOR MEDICAL PURPOSES ONLY.  IF CONFIRMATION IS NEEDED FOR ANY PURPOSE, NOTIFY LAB WITHIN 5 DAYS.  LOWEST DETECTABLE LIMITS FOR URINE DRUG SCREEN Drug Class                     Cutoff (ng/mL) Amphetamine and metabolites    1000 Barbiturate and metabolites    200 Benzodiazepine                 200 Opiates and metabolites        300 Cocaine and metabolites        300 THC                            50 Performed at [facility], [address] [place], [place] [address]     Blood Alcohol level:  Lab Results  Component Value Date   ETH <10 03/13/2023   ETH <10 11/14/2022    Metabolic Disorder Labs: No results found for: HGBA1C, MPG No results found for: PROLACTIN No results found for: CHOL, TRIG, HDL, CHOLHDL, VLDL, LDLCALC  Therapeutic Lab Levels: No results found for: LITHIUM No results found for: VALPROATE No results found for: CBMZ  Physical Findings   AUDIT    Flowsheet Row ED to [facility]osp-Ad[facility] (Discharged[facility]  Alcohol Use Disorder Identification Test Final Score (AUDIT) 0      PHQ2-9    Flowsheet Row ED from 03/13/2023 in [facility]  PHQ-2 Total Score 4  PHQ-9 Total Score 13      Flowsheet Row ED from 03/13/2023 in [facility] Most recent reading at 03/13/2023 12:27 PM ED from 03/13/2023 in [facility] Emergency Department at [facility] Most recent reading at 03/13/2023  3:02 AM ED from 03/05/2023 in [facility] Emergency Department at [facility] Most recent reading at 03/05/2023 12:02 PM  C-SSRS RISK CATEGORY Low Risk Low Risk No Risk        Musculoskeletal  Strength & Muscle Tone: within normal limits Gait & Station:  normal Patient leans: N/A  Psychiatric Specialty Exam  Presentation  General Appearance:  Appropriate for Environment  Eye Contact: Fair  Speech: Normal Rate  Speech Volume: Normal  Handedness: Right   Mood and Affect  Mood: Anxious  Affect: Congruent   Thought Process  Thought Processes: Coherent  Descriptions of Associations:Intact  Orientation:Full (Time, Place and Person)  Thought Content:WDL  Diagnosis of Schizophrenia or Schizoaffective disorder in past: No    Hallucinations:No data recorded  Ideas of Reference:None  Suicidal Thoughts:No data recorded  Homicidal Thoughts:No data recorded   Sensorium  Memory: Immediate Fair  Judgment: Fair  Insight: Fair   Art Therapist  Concentration: Fair  Attention Span: Fair  Recall: Fair  Fund of Knowledge: Good  Language: Good   Psychomotor Activity  Psychomotor Activity: No data recorded   Assets  Assets: Communication Skills; Desire for Improvement   Sleep  Sleep: No data recorded   No data recorded  Physical Exam  Physical exam: Please see exam on admit note. General: Well developed, well nourished.  Pupils: Normal at 3mm Respiratory: Breathing is unlabored.  Cardiovascular: No edema.  Language: No anomia, no aphasia Muscle strength and tone-pt moving all extremities.  Gait not assessed as pt remained in bed.  Neuro: Facial muscles are symmetric. Pt without tremor, no evidence of hyperarousal.  Review of Systems  Constitutional: Negative.   Eyes: Negative.   Respiratory: Negative.    Cardiovascular: Negative.   Gastrointestinal: Negative.   Genitourinary: Negative.   Musculoskeletal: Negative.   Skin: Negative.   Neurological: Negative.   Endo/Heme/Allergies: Negative.   Psychiatric/Behavioral:  Positive for depression and substance abuse. The patient is nervous/anxious and has insomnia.    Blood pressure (!) 127/90, pulse (!) 102, temperature 98  F (36.7 C), resp. rate 16, SpO2 99%. There is no height or weight on file to calculate BMI.  Treatment Plan Summary: Daily contact with patient to assess and evaluate symptoms and progress in treatment, Medication management, and Plan - cont Seroquel  100 mg at bedtime for PTSD symptoms and as augmenting agent for Effexor  + 50mg  q6h-PRN for intrusive memories, insomnia, anxiety; discussed at length r/b/a with patient and he is a poor candidate for BZD and states gabapentin  and hydroxyzine  have been ineffective (gabapentin  is on allergy list as well) - stopped hydroxyzine  due to ineffectiveness -cont Buspar  10 mg TID cont Effexor  XR to 75 mg qdaily  Desarai Barrack, MD 03/17/2023 9:25 PM

## 2023-03-17 NOTE — ED Notes (Addendum)
 Patient is sleeping. Respirations equal and unlabored, skin warm and dry. No change in assessment or acuity. Routine safety checks conducted according to facility protocol. Will continue to monitor for safety.

## 2023-03-17 NOTE — Group Note (Signed)
 Group Topic: Social Support  Group Date: 03/17/2023 Start Time: 1900 End Time: 1915 Facilitators: Joan Plowman B  Department: [facility]  Number of Participants: 3  Group Focus: check in Treatment Modality:  Individual Therapy Interventions utilized were support Purpose: express feelings  Name: STONE SPIRITO Date of Birth: 02-05-1976  MR: [id number]    Level of Participation: active Quality of Participation: cooperative, initiates communication, motivated, and offered feedback Interactions with others: gave feedback Mood/Affect: appropriate Triggers (if applicable): NA Cognition: coherent/clear Progress: Gaining insight Response: NA Plan: patient will be encouraged to keep going to groups.   Patients Problems:  Patient Active Problem List   Diagnosis Date Noted   MDD (major depressive disorder) 03/13/2023   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

## 2023-03-17 NOTE — Progress Notes (Signed)
Pt is awake, alert and oriented X4. Pt did not voice any complaints of pain or discomfort. No signs of acute distress noted. Administered scheduled meds per order. Pt denies current SI/HI/AVH, plan or intent. Staff will monitor for pt's safety.

## 2023-03-17 NOTE — Progress Notes (Signed)
 Pt complained of anxiety and PRN Seroquel  50mg  was administered. Pt reported of light-headedness approximately 30 minutes later. V/S was checked and documented. Pt was advised that medication is a sedative and was educated on fall precautions. Pt was receptive. Starkes-Perry FNP was notified. No new orders. Staff will continue to monitor pt for safety.

## 2023-03-17 NOTE — Progress Notes (Signed)
Pt was visible in the milieu and interacted with peer. No distress noted or concerns voiced. Staff will monitor for pt's safety.

## 2023-03-18 DIAGNOSIS — F332 Major depressive disorder, recurrent severe without psychotic features: Secondary | ICD-10-CM | POA: Diagnosis not present

## 2023-03-18 MED ORDER — GABAPENTIN 100 MG PO CAPS
200.0000 mg | ORAL_CAPSULE | Freq: Three times a day (TID) | ORAL | Status: DC
  Administered 2023-03-18 – 2023-03-20 (×6): 200 mg via ORAL
  Filled 2023-03-18 (×6): qty 2

## 2023-03-18 NOTE — [facility] Group Notes (Signed)
 Spirituality group facilitated by Elia Donnice Luria, MDiv, BCC.  Group Description: Group focused on topic of hope. Patients participated in facilitated discussion around topic, connecting with one another around experiences and definitions for hope. Group members engaged with visual explorer photos, reflecting on what hope looks like for them today. Group engaged in discussion around how their definitions of hope are present today in hospital.  Modalities: Psycho-social ed, Adlerian, Narrative, MI  Patient Progress: Present throughout group.  Engaged openly in group discussion.

## 2023-03-18 NOTE — ED Notes (Signed)
 Patient in the Dayroom watching TV with other patients. Seen calm and pleasant. Denies SI/HI/AVH. NAD Respirations even and unlabored. Will keep monitoring for safety

## 2023-03-18 NOTE — Discharge Instructions (Addendum)
 Base on the information you have provided and the presenting issue, outpatient services and resources for have been recommended.  It is imperative that you follow through with treatment recommendations within 5-7 days from the of discharge to mitigate further risk to your safety and mental well-being. A list of referrals has been provided below to get you started.  You are not limited to the list provided.  In case of an urgent crisis, you may contact the Mobile Crisis Unit with [facility] at [phone].  .OBS Care Management   Outpatient Services for Therapy and Medication Management   Based on what you have shared, a list of resources for outpatient therapy and psychiatry is provided below to get you started back on treatment.  It is imperative that you follow through with treatment within 5-7 days from the day of discharge to prevent any further risk to your safety or mental well-being.  You are not limited to the list provided.  In case of an urgent crisis, you may contact the Mobile Crisis Unit with [facility] at [phone].         [facility] [address], [place], [address] [phone] phone  New Patient Assessment/Therapy Walk-ins Monday and Wednesday: 8am until slots are full. Every 1st and 2nd Friday: 1pm - 5pm  NO ASSESSMENT/THERAPY WALK-INS ON TUESDAYS OR THURSDAYS  New Patient Psychiatry/Medication Management Walk-ins Monday-Friday: 8am-11am  For all walk-ins, we ask that you arrive by 7:30am because patient will be seen in the order of arrival.  Availability is limited; therefore, you may not be seen on the same day that you walk-in.  Our goal is to serve and meet the needs of our community to the best of our ability.   [facility] [address] [place], [place], [address] [phone] phone  [facility] [address] [place], [place],  [address] [phone] phone [phone] phone ([address], [address], Anthem/Elevance, [address], [place], [address], [address], Healthy [place], [place], [place], [address], Conocophillips, [place], UHC, American Financial, [place], Out of Network)  [employer], [place] [address] [place], [place], [address] [phone] phone ([place], Anthem/Elevance, Sanmina-sci Options/Carelon, BCBS, [address], [place], [place], [place], [place], [employer], [place], [place], [place], [facility])  [employer] [address] [place], [place], [address] [phone] phone (Medicaid, ask about other insurance)  [facility] [address], [address] [place], [place], [address] [phone] phone [phone] fax ([address] [place] , [place], [place], [place] Health Choice, UHC, GENERAL ELECTRIC, Self-Pay)  Sarah Lempka [address] [facility] [address], [place], [address] [phone] phone ([address], Anthem/Elevance, [address], [address], [place], [employer]x Corporation, [place], [place], [place], Har[employer], [place], [place], [place], [facility])  [facility] - 6-8 MONTH WAIT FOR THERAPY; SOONER FOR MEDICATION MANAGEMENT [address], [address] [place], [place], [address] [phone] phone ([address] AmeriHealth [address] - [place], [address], [place], [place], Friday Health Plans, [address], BCBS Healthy [place], [place], [address], [place], [place], [place], [place], Tricare, UHC, Safeco Corporation, [place])  [facility] [address], [address] [place], [place], [address] [phone] phone  [facility] [address] [place], [place], [address] [phone] phone  [facility] [address] [place], [place], [address] [phone] phone  [facility] - THERAPY ONLY [address]  [place], [place], [address] [phone] phone  [facility], [place] [address], [place],  [address] [phone] phone  [facility] [address] [place], [place], [address] [phone] phone [phone] fax  [facility] [address] [place], [place], [address] [phone] phone [phone] fax  [facility] [phone] [address] [place], [place], [address] [phone] phone  Janit Griffins [address] [place], [place], [address]  [phone] phone  [facility]  (Adults Only) [address] [place], [place], [address]  [phone] phone [phone] fax                       ..Shelter List:   [facility]          -(Please Call) Phone: [phone]   Provides housing and specialized case management focused on housing stability.    [facility] NIGHT SHELTER) [address] [place], [place] Phone: [phone]   [facility] has been providing emergency shelter to those in need of a permanent residence for over 35 years. The [employer] shelter plays an important role in our community.   There are many life events that can pull someone into a downward spiral towards poverty that is very difficult to get out of. Homelessness is a problem that can affect anyone of us . [employer] [facility] is a safe and comforting place to stay, especially if you have experienced the hardship of street life.   [facility] provides a single bed and bedding to 100 adult men and women. The shelter welcomes all who are in need of housing, no one in real need is turned away unless space is not available.   While staying at [facility], guests are offered more than just a bed for a night. Hot meals are provided and every guest has access to case management services. Case managers provide assistance with finding housing, employment, or other services that will help them gain stability. Continuous stay is  based on availability, capacity, and progress towards goals.   To contact the front desk of [facility] please call   [phone] ext [phone] or ext. [phone].   [facility] [address], [place], [place] [address] Phone: [phone]   [facility] (Women only) [address]RA Isadora Solon [place], [place] [address] Phone: [phone]   [facility] Address: [address], [place], [place] [address] Hours:   Opens 9?AM Mon Phone: [phone]  [facility] providers a continuum of housing services to those experiencing homelessness. They provide transitional [employer] and permanent supportive housing ([place] and [employer]) to disabled veterans experiencing homelessness. There is a fast track Rapid Rehousing program couples housing stability services with temporary financial assistance to quickly house individuals and families experiencing homelessness.   [employer] [address], [place] [address] Phone: [phone]   [facility] [address]wood [address]ner [place], [place] [address] Phone: [phone]   [facility] [address] [place], [place] [address] Check in at 6:00PM for placement at a local shelter) Phone: [phone]   [facility] Eligibility:  Must be drug and alcohol free for at least 14 days or more at the time of application. This program serves males.  Houses engineer, civil (consulting), economist who serve six-month terms.  Houses are financially self-supporting; members split house expenses, which average $90.00 to $130.00 per person per week.  Any Resident who relapses must be  immediately expelled. Call:  [phone]   [facility]  Address: [address] Fonder [place], [place] [address]  Phone#: [phone] [facility] Address: [address] [place], [place] [address] Phone:  [phone]  -[facility] provides food, shelter and other programs and services to the homeless men of [place] through our [place] mutual program.  By offering safe shelter, three meals a day, clean clothing, Biblical counseling, financial planning, vocational training, GED/education and employment assistance, we've helped mend the shattered lives of many homeless men since opening in [place].  We have approximately 267 beds available, with a max of 312 beds including mats for emergency situations and currently house an average of 270 men a night.  Prospective Client Check-In Information Photo ID Required (State/ Out of State/ [facility]) - if photo ID is not available, clients are required to have a printout of a police/sheriff's criminal history report. Help out with chores around the [place]. No sex offender of any type (pending, charged, registered and/or any other sex related offenses) will be permitted to check in. Must be willing to abide by all rules, regulations, and policies established by the [employer]. The following will be provided - shelter, food, clothing, and biblical counseling. If you or someone you know is in need of assistance at our [facility] shelter in [place], [place], please call [phone]. [phone].  Women Shelter for [employer] hours are Monday-Friday only.      Listing of PHP Programs in [facility]   [facility]  [phone]   [facility]  [phone]  [facility]  [phone]

## 2023-03-18 NOTE — ED Notes (Signed)
 Pt sleeping in no acute distress. RR even and unlabored. Environment secured. Will continue to monitor for safety.

## 2023-03-18 NOTE — ED Notes (Signed)
 Pt is in the bedroom calm and reading a book. NAD. Respirations are even and unlabored Will continue to monitor for safety.

## 2023-03-18 NOTE — ED Notes (Signed)
 Patient A&Ox4. Denies intent to harm self/others when asked. Denies A/VH. Patient denies any physical complaints when asked. No acute distress noted. Support and encouragement provided. Routine safety checks conducted according to facility protocol. Encouraged patient to notify staff if thoughts of harm toward self or others arise. Patient verbalize understanding and agreement. Will continue to monitor for safety.

## 2023-03-18 NOTE — Care Management (Signed)
 [facility] Care Management   Writer provided patient a listing of homeless sheters.

## 2023-03-18 NOTE — Group Note (Signed)
 Group Topic: Communication  Group Date: 03/18/2023 Start Time: 0900 End Time: 1000 Facilitators: Herold Lajuana NOVAK, RN  Department: [facility]  Number of Participants: 7  Group Focus: communication Treatment Modality:  Individual Therapy Interventions utilized were leisure development Purpose: increase insight  Name: Michael [name] Date of Birth: [birth year]  MR: [id number]    Level of Participation: active Quality of Participation: cooperative Interactions with others: gave feedback Mood/Affect: appropriate Triggers (if applicable): none identified Cognition: coherent/clear Progress: Gaining insight Response: verbalized understanding Plan: patient will be encouraged to remain compliant with medications and notify staff with any concerns   Patients Problems:  Patient Active Problem List   Diagnosis Date Noted   MDD (major depressive disorder) 03/13/2023   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

## 2023-03-18 NOTE — ED Provider Notes (Signed)
 Behavioral Health Progress Note  Date and Time: 03/18/2023 3:22 PM Name: Michael [name] MRN:  [id number]  Subjective:  Patient reports high anxiety and having debilitating panic attacks still today and states he feels the  Seroquel  slowing my body down but not my mind. States he has trouble breathing and an impending sense of doom, sweating and feeling numbness and tingling in his hands and episodes last about 15 minutes. Reports they all started after coming off Benzodiazepines. States his anxiety has been so debilitating he is unable to leave home at times but would like to do IOP if possible.  Encouraged patient to make use of deep breathing and other coping techniques. Patient is interested in IOP services and was provided his insurance number and a list of places to call. Patient denies any side effects. Reports fair sleep and appetite.  Tolerating medications otherwise.  Denies HI.  Diagnosis:  Final diagnoses:  MDD (major depressive disorder), recurrent severe, without psychosis (HCC)  GAD (generalized anxiety disorder)  Trauma and stressor-related disorder  History of benzodiazepine dependence, in remission  Total Time spent with patient: 20 minutes  Past Psychiatric History: The patient has a longstanding history of major depression, generalized anxiety disorder and panic attacks.  His previous admission to [facility] was in October.  He was discharged with outpatient follow-up and was placed on BuSpar  10 mg twice a day, trazodone  50 mg at bedtime and Effexor  XR 150 mg a day.  Patient essentially stopped all the medications.   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 Social History: Patient is currently single.  He is homeless and lives in an self storage facility.  He was married once but has been divorced for over 10 years.  He has a daughter age 26 but states that he has not  seen her in a long time and she lives in [place].  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                         Sleep: Fair  Appetite:  Fair  Current Medications:  Current Facility-Administered Medications  Medication Dose Route Frequency Provider Last Rate Last Admin   alum & mag hydroxide-simeth (MAALOX/MYLANTA) 200-200-20 MG/5ML suspension 30 mL  30 mL Oral Q4H PRN Lewis, Tanika N, NP       busPIRone  (BUSPAR ) tablet 10 mg  10 mg Oral TID Goli, Veeraindar, MD   10 mg at 03/18/23 0941   gabapentin  (NEURONTIN ) capsule 200 mg  200 mg Oral TID Carlisle Enke, MD       haloperidol  (HALDOL ) tablet 5 mg  5 mg Oral TID PRN Ezzard Staci SAILOR, NP       magnesium  hydroxide (MILK OF MAGNESIA) suspension 30 mL  30 mL Oral Daily PRN Lewis, Tanika N, NP       nicotine  (NICODERM CQ  - dosed in mg/24 hours) patch 21 mg  21 mg Transdermal Daily Wilkie Majel RAMAN, FNP   21 mg at 03/18/23 0940   QUEtiapine  (SEROQUEL ) tablet 100 mg  100 mg Oral QHS Timmothy Baranowski, MD   100 mg at 03/17/23 2147   QUEtiapine  (SEROQUEL ) tablet 50 mg  50 mg Oral Q6H PRN Dalinda Heidt, MD   50 mg at 03/18/23 1138   venlafaxine  XR (EFFEXOR -XR) 24 hr capsule 75  mg  75 mg Oral Q breakfast Kizzi Overbey, MD   75 mg at 03/18/23 0900   Current Outpatient Medications  Medication Sig Dispense Refill   ALPRAZolam  (XANAX ) 1 MG tablet Take 1 tablet (1 mg total) by mouth 2 (two) times daily as needed for up to 12 doses for anxiety. (Patient not taking: Reported on 03/13/2023) 12 tablet 0    Labs  Lab Results:  Admission on 03/13/2023, Discharged on 03/13/2023  Component Date Value Ref Range Status   Sodium 03/13/2023 135  135 - 145 mmol/L Final   Potassium 03/13/2023 3.9  3.5 - 5.1 mmol/L Final   Chloride 03/13/2023 99  98 - 111 mmol/L Final   CO2 03/13/2023 28  22 - 32 mmol/L Final   Glucose, Bld 03/13/2023 120 (H)  70 - 99 mg/dL Final   Glucose  reference range applies only to samples taken after fasting for at least 8 hours.   BUN 03/13/2023 5 (L)  6 - 20 mg/dL Final   Creatinine, Ser 03/13/2023 0.70  0.61 - 1.24 mg/dL Final   Calcium 97/97/7974 8.7 (L)  8.9 - 10.3 mg/dL Final   Total Protein 97/97/7974 7.1  6.5 - 8.1 g/dL Final   Albumin 97/97/7974 4.3  3.5 - 5.0 g/dL Final   AST 97/97/7974 22  15 - 41 U/L Final   ALT 03/13/2023 11  0 - 44 U/L Final   Alkaline Phosphatase 03/13/2023 55  38 - 126 U/L Final   Total Bilirubin 03/13/2023 0.8  0.0 - 1.2 mg/dL Final   GFR, Estimated 03/13/2023 >60  >60 mL/min Final   Comment: (NOTE) Calculated using the CKD-EPI Creatinine Equation (2021)    Anion gap 03/13/2023 8  5 - 15 Final   Performed at [facility], [address], [place], [place] [address]   Alcohol, Ethyl (B) 03/13/2023 <10  <10 mg/dL Final   Comment: (NOTE) Lowest detectable limit for serum alcohol is 10 mg/dL.  For medical purposes only. Performed at [facility], [address], [place], [place] [address]    WBC 03/13/2023 10.9 (H)  4.0 - 10.5 K/uL Final   RBC 03/13/2023 4.81  4.22 - 5.81 MIL/uL Final   Hemoglobin 03/13/2023 14.8  13.0 - 17.0 g/dL Final   HCT 97/97/7974 43.5  39.0 - 52.0 % Final   MCV 03/13/2023 90.4  80.0 - 100.0 fL Final   MCH 03/13/2023 30.8  26.0 - 34.0 pg Final   MCHC 03/13/2023 34.0  30.0 - 36.0 g/dL Final   RDW 97/97/7974 12.2  11.5 - 15.5 % Final   Platelets 03/13/2023 288  150 - 400 K/uL Final   nRBC 03/13/2023 0.0  0.0 - 0.2 % Final   Performed at [facility], [address] [place], [place] [address]   Opiates 03/13/2023 NONE DETECTED  NONE DETECTED Final   Cocaine 03/13/2023 NONE DETECTED  NONE DETECTED Final   Benzodiazepines 03/13/2023 POSITIVE (A)  NONE DETECTED Final   Amphetamines 03/13/2023 NONE DETECTED  NONE DETECTED Final   Tetrahydrocannabinol 03/13/2023 POSITIVE (A)  NONE DETECTED Final   Barbiturates 03/13/2023  NONE DETECTED  NONE DETECTED Final   Comment: (NOTE) DRUG SCREEN FOR MEDICAL PURPOSES ONLY.  IF CONFIRMATION IS NEEDED FOR ANY PURPOSE, NOTIFY LAB WITHIN 5 DAYS.  LOWEST DETECTABLE LIMITS FOR URINE DRUG SCREEN Drug Class                     Cutoff (ng/mL) Amphetamine and  metabolites    1000 Barbiturate and metabolites    200 Benzodiazepine                 200 Opiates and metabolites        300 Cocaine and metabolites        300 THC                            50 Performed at [facility], [address] [place], [place] [address]   Admission on 11/15/2022, Discharged on 11/25/2022  Component Date Value Ref Range Status   Sodium 11/19/2022 135  135 - 145 mmol/L Final   Potassium 11/19/2022 3.8  3.5 - 5.1 mmol/L Final   Chloride 11/19/2022 98  98 - 111 mmol/L Final   CO2 11/19/2022 24  22 - 32 mmol/L Final   Glucose, Bld 11/19/2022 96  70 - 99 mg/dL Final   Glucose reference range applies only to samples taken after fasting for at least 8 hours.   BUN 11/19/2022 9  6 - 20 mg/dL Final   Creatinine, Ser 11/19/2022 0.75  0.61 - 1.24 mg/dL Final   Calcium 89/88/7975 8.9  8.9 - 10.3 mg/dL Final   GFR, Estimated 11/19/2022 >60  >60 mL/min Final   Comment: (NOTE) Calculated using the CKD-EPI Creatinine Equation (2021)    Anion gap 11/19/2022 13  5 - 15 Final   Performed at [facility], [address], [place], [place] [address]   WBC 11/19/2022 8.9  4.0 - 10.5 K/uL Final   RBC 11/19/2022 4.56  4.22 - 5.81 MIL/uL Final   Hemoglobin 11/19/2022 14.3  13.0 - 17.0 g/dL Final   HCT 89/88/7975 43.3  39.0 - 52.0 % Final   MCV 11/19/2022 95.0  80.0 - 100.0 fL Final   MCH 11/19/2022 31.4  26.0 - 34.0 pg Final   MCHC 11/19/2022 33.0  30.0 - 36.0 g/dL Final   RDW 89/88/7975 12.2  11.5 - 15.5 % Final   Platelets 11/19/2022 337  150 - 400 K/uL Final   nRBC 11/19/2022 0.0  0.0 - 0.2 % Final   Performed at [facility], [address], [place], [place] [address]    Glucose-Capillary 11/19/2022 98  70 - 99 mg/dL Final   Glucose reference range applies only to samples taken after fasting for at least 8 hours.  Admission on 11/14/2022, Discharged on 11/15/2022  Component Date Value Ref Range Status   Sodium 11/14/2022 136  135 - 145 mmol/L Final   Potassium 11/14/2022 4.0  3.5 - 5.1 mmol/L Final   Chloride 11/14/2022 100  98 - 111 mmol/L Final   CO2 11/14/2022 27  22 - 32 mmol/L Final   Glucose, Bld 11/14/2022 117 (H)  70 - 99 mg/dL Final   Glucose reference range applies only to samples taken after fasting for at least 8 hours.   BUN 11/14/2022 7  6 - 20 mg/dL Final   Creatinine, Ser 11/14/2022 0.84  0.61 - 1.24 mg/dL Final   Calcium 89/93/7975 8.8 (L)  8.9 - 10.3 mg/dL Final   Total Protein 89/93/7975 7.7  6.5 - 8.1 g/dL Final   Albumin 89/93/7975 4.2  3.5 - 5.0 g/dL Final   AST 89/93/7975 12 (L)  15 - 41 U/L Final   ALT 11/14/2022 11  0 - 44 U/L Final   Alkaline Phosphatase 11/14/2022 59  38 - 126 U/L Final  Total Bilirubin 11/14/2022 1.4 (H)  0.3 - 1.2 mg/dL Final   GFR, Estimated 11/14/2022 >60  >60 mL/min Final   Comment: (NOTE) Calculated using the CKD-EPI Creatinine Equation (2021)    Anion gap 11/14/2022 9  5 - 15 Final   Performed at [facility], [address], [place], [place] [address]   Alcohol, Ethyl (B) 11/14/2022 <10  <10 mg/dL Final   Comment: (NOTE) Lowest detectable limit for serum alcohol is 10 mg/dL.  For medical purposes only. Performed at [facility], [address] [place], [place] [address]    WBC 11/14/2022 4.7  4.0 - 10.5 K/uL Final   RBC 11/14/2022 4.59  4.22 - 5.81 MIL/uL Final   Hemoglobin 11/14/2022 14.9  13.0 - 17.0 g/dL Final   HCT 89/93/7975 44.5  39.0 - 52.0 % Final   MCV 11/14/2022 96.9  80.0 - 100.0 fL Final   MCH 11/14/2022 32.5  26.0 - 34.0 pg Final   MCHC 11/14/2022 33.5  30.0 - 36.0 g/dL Final   RDW 89/93/7975 12.8  11.5 - 15.5 % Final   Platelets  11/14/2022 319  150 - 400 K/uL Final   nRBC 11/14/2022 0.0  0.0 - 0.2 % Final   Performed at [facility], [address] [place], [place] [address]   Opiates 11/14/2022 NONE DETECTED  NONE DETECTED Final   Cocaine 11/14/2022 NONE DETECTED  NONE DETECTED Final   Benzodiazepines 11/14/2022 POSITIVE (A)  NONE DETECTED Final   Amphetamines 11/14/2022 NONE DETECTED  NONE DETECTED Final   Tetrahydrocannabinol 11/14/2022 NONE DETECTED  NONE DETECTED Final   Barbiturates 11/14/2022 NONE DETECTED  NONE DETECTED Final   Comment: (NOTE) DRUG SCREEN FOR MEDICAL PURPOSES ONLY.  IF CONFIRMATION IS NEEDED FOR ANY PURPOSE, NOTIFY LAB WITHIN 5 DAYS.  LOWEST DETECTABLE LIMITS FOR URINE DRUG SCREEN Drug Class                     Cutoff (ng/mL) Amphetamine and metabolites    1000 Barbiturate and metabolites    200 Benzodiazepine                 200 Opiates and metabolites        300 Cocaine and metabolites        300 THC                            50 Performed at [facility], [address], [place], [place] [address]     Blood Alcohol level:  Lab Results  Component Value Date   ETH <10 03/13/2023   ETH <10 11/14/2022    Metabolic Disorder Labs: No results found for: HGBA1C, MPG No results found for: PROLACTIN No results found for: CHOL, TRIG, HDL, CHOLHDL, VLDL, LDLCALC  Therapeutic Lab Levels: No results found for: LITHIUM No results found for: VALPROATE No results found for: CBMZ  Physical Findings   AUDIT    Flowsheet Row ED to Hosp-Admission (Discharged) [facility]  Alcohol Use Disorder Identification Test Final Score (AUDIT) 0      PHQ2-9    Flowsheet Row ED from 03/13/2023 in [facility]  PHQ-2 Total Score 4  PHQ-9 Total Score 13      Flowsheet Row ED from 03/13/2023 in [facility] Most recent  reading at 03/13/2023 12:27 PM ED from 03/13/2023  in [facility] Emergency Department at [facility] Most recent reading at 03/13/2023  3:02 AM ED from 03/05/2023 in [facility] Emergency Department at [facility] Most recent reading at 03/05/2023 12:02 PM  C-SSRS RISK CATEGORY Low Risk Low Risk No Risk        Musculoskeletal  Strength & Muscle Tone: within normal limits Gait & Station: normal Patient leans: N/A  Psychiatric Specialty Exam  Presentation  General Appearance:  Appropriate for Environment  Eye Contact: Fair  Speech: Normal Rate  Speech Volume: Normal  Handedness: Right   Mood and Affect  Mood: Anxious  Affect: Congruent   Thought Process  Thought Processes: Coherent  Descriptions of Associations:Intact  Orientation:Full (Time, Place and Person)  Thought Content:WDL  Diagnosis of Schizophrenia or Schizoaffective disorder in past: No    Hallucinations:No data recorded  Ideas of Reference:None  Suicidal Thoughts:No data recorded  Homicidal Thoughts:No data recorded   Sensorium  Memory: Immediate Fair  Judgment: Fair  Insight: Fair   Art Therapist  Concentration: Fair  Attention Span: Fair  Recall: Fair  Fund of Knowledge: Good  Language: Good   Psychomotor Activity  Psychomotor Activity: No data recorded   Assets  Assets: Communication Skills; Desire for Improvement   Sleep  Sleep: No data recorded   No data recorded  Physical Exam  Physical exam: Please see exam on admit note. General: Well developed, well nourished.  Pupils: Normal at 3mm Respiratory: Breathing is unlabored.  Cardiovascular: No edema.  Language: No anomia, no aphasia Muscle strength and tone-pt moving all extremities.  Gait not assessed as pt remained in bed.  Neuro: Facial muscles are symmetric. Pt without tremor, no evidence of hyperarousal.  Review of Systems  Constitutional: Negative.   Eyes: Negative.    Respiratory: Negative.    Cardiovascular: Negative.   Gastrointestinal: Negative.   Genitourinary: Negative.   Musculoskeletal: Negative.   Skin: Negative.   Neurological: Negative.   Endo/Heme/Allergies: Negative.   Psychiatric/Behavioral:  Positive for depression and substance abuse. The patient is nervous/anxious and has insomnia.    Blood pressure 103/72, pulse 95, temperature 98.1 F (36.7 C), temperature source Oral, resp. rate 18, SpO2 95%. There is no height or weight on file to calculate BMI.  Treatment Plan Summary: Daily contact with patient to assess and evaluate symptoms and progress in treatment, Medication management, and Plan - cont Seroquel  100 mg at bedtime for PTSD symptoms and as augmenting agent for Effexor  + 50mg  q6h-PRN for intrusive memories, insomnia, anxiety; discussed at length r/b/a with patient and he is a poor candidate for BZD and states gabapentin  and hydroxyzine  have been ineffective (gabapentin  is on allergy list as well) - stopped hydroxyzine  due to ineffectiveness - reviewed allergy list with patient and he states gabapentin  is not a true allergy and he would like to retry it given panic attacks, will restart 200 mg TID after discussion of r/b/a with patient -cont Buspar  10 mg TID cont Effexor  XR to 75 mg qdaily  Kadisha Goodine, MD 03/18/2023 3:22 PM

## 2023-03-19 DIAGNOSIS — F332 Major depressive disorder, recurrent severe without psychotic features: Secondary | ICD-10-CM | POA: Diagnosis not present

## 2023-03-19 NOTE — ED Notes (Addendum)
 Patient A&Ox4. Denies intent to harm self/others when asked. Denies A/VH. Patient denies any physical complaints when asked. No acute distress noted. Support and encouragement provided. Routine safety checks conducted according to facility protocol. Encouraged patient to notify staff if thoughts of harm toward self or others arise. Patient verbalize understanding and agreement. Will continue to monitor for safety.

## 2023-03-19 NOTE — ED Provider Notes (Signed)
 Behavioral Health Progress Note  Date and Time: 03/19/2023 8:54 AM Name: Michael [name] MRN:  [id number]  Subjective:  Patient reports high anxiety but overall improvement as compared to when he was first admitted. He feels being at [facility] has helped but is very anxious about discharging. He is interested in doing IOP. Patient denies SI/HI/AVH. Reports eating and appetite are fine. He expresses desire to discharge Monday for a fresh start.   Diagnosis:  Final diagnoses:  MDD (major depressive disorder), recurrent severe, without psychosis (HCC)  GAD (generalized anxiety disorder)  Trauma and stressor-related disorder  History of benzodiazepine dependence  Total Time spent with patient: 20 minutes  Past Psychiatric History: The patient has a longstanding history of major depression, generalized anxiety disorder and panic attacks.  His previous admission to [facility] was in October.  He was discharged with outpatient follow-up and was placed on BuSpar  10 mg twice a day, trazodone  50 mg at bedtime and Effexor  XR 150 mg a day.  Patient essentially stopped all the medications.   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 Social History: Patient is currently single.  He is homeless and lives in an self storage facility.  He was married once but has been divorced for over 10 years.  He has a daughter age 49 but states that he has not seen her in a long time and she lives in [place].  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                         Sleep: Fair  Appetite:  Fair  Current Medications:  Current Facility-Administered Medications  Medication Dose Route Frequency Provider Last Rate Last Admin   alum & mag hydroxide-simeth (MAALOX/MYLANTA) 200-200-20 MG/5ML suspension 30 mL  30 mL Oral Q4H PRN Lewis,  Tanika N, NP       busPIRone  (BUSPAR ) tablet 10 mg  10 mg Oral TID Goli, Veeraindar, MD   10 mg at 03/18/23 2110   gabapentin  (NEURONTIN ) capsule 200 mg  200 mg Oral TID Zouev, Dmitri, MD   200 mg at 03/18/23 2111   haloperidol  (HALDOL ) tablet 5 mg  5 mg Oral TID PRN Lewis, Tanika N, NP       magnesium  hydroxide (MILK OF MAGNESIA) suspension 30 mL  30 mL Oral Daily PRN Lewis, Tanika N, NP       nicotine  (NICODERM CQ  - dosed in mg/24 hours) patch 21 mg  21 mg Transdermal Daily Wilkie Majel RAMAN, FNP   21 mg at 03/18/23 0940   QUEtiapine  (SEROQUEL ) tablet 100 mg  100 mg Oral QHS Zouev, Dmitri, MD   100 mg at 03/18/23 2110   QUEtiapine  (SEROQUEL ) tablet 50 mg  50 mg Oral Q6H PRN Zouev, Dmitri, MD   50 mg at 03/18/23 1138   venlafaxine  XR (EFFEXOR -XR) 24 hr capsule 75 mg  75 mg Oral Q breakfast Zouev, Dmitri, MD   75 mg at 03/18/23 0900   Current Outpatient Medications  Medication Sig Dispense Refill   ALPRAZolam  (XANAX ) 1 MG tablet Take 1 tablet (1 mg total) by mouth 2 (two) times daily as needed for up to 12 doses for anxiety. (Patient not taking: Reported on 03/13/2023) 12 tablet 0    Labs  Lab Results:  Admission on 03/13/2023, Discharged on  03/13/2023  Component Date Value Ref Range Status   Sodium 03/13/2023 135  135 - 145 mmol/L Final   Potassium 03/13/2023 3.9  3.5 - 5.1 mmol/L Final   Chloride 03/13/2023 99  98 - 111 mmol/L Final   CO2 03/13/2023 28  22 - 32 mmol/L Final   Glucose, Bld 03/13/2023 120 (H)  70 - 99 mg/dL Final   Glucose reference range applies only to samples taken after fasting for at least 8 hours.   BUN 03/13/2023 5 (L)  6 - 20 mg/dL Final   Creatinine, Ser 03/13/2023 0.70  0.61 - 1.24 mg/dL Final   Calcium 97/97/7974 8.7 (L)  8.9 - 10.3 mg/dL Final   Total Protein 97/97/7974 7.1  6.5 - 8.1 g/dL Final   Albumin 97/97/7974 4.3  3.5 - 5.0 g/dL Final   AST 97/97/7974 22  15 - 41 U/L Final   ALT 03/13/2023 11  0 - 44 U/L Final   Alkaline Phosphatase 03/13/2023 55   38 - 126 U/L Final   Total Bilirubin 03/13/2023 0.8  0.0 - 1.2 mg/dL Final   GFR, Estimated 03/13/2023 >60  >60 mL/min Final   Comment: (NOTE) Calculated using the CKD-EPI Creatinine Equation (2021)    Anion gap 03/13/2023 8  5 - 15 Final   Performed at [facility], [address], [place], [place] [address]   Alcohol, Ethyl (B) 03/13/2023 <10  <10 mg/dL Final   Comment: (NOTE) Lowest detectable limit for serum alcohol is 10 mg/dL.  For medical purposes only. Performed at [facility], [address], [place], [place] [address]    WBC 03/13/2023 10.9 (H)  4.0 - 10.5 K/uL Final   RBC 03/13/2023 4.81  4.22 - 5.81 MIL/uL Final   Hemoglobin 03/13/2023 14.8  13.0 - 17.0 g/dL Final   HCT 97/97/7974 43.5  39.0 - 52.0 % Final   MCV 03/13/2023 90.4  80.0 - 100.0 fL Final   MCH 03/13/2023 30.8  26.0 - 34.0 pg Final   MCHC 03/13/2023 34.0  30.0 - 36.0 g/dL Final   RDW 97/97/7974 12.2  11.5 - 15.5 % Final   Platelets 03/13/2023 288  150 - 400 K/uL Final   nRBC 03/13/2023 0.0  0.0 - 0.2 % Final   Performed at [facility], [address], [place], [place] [address]   Opiates 03/13/2023 NONE DETECTED  NONE DETECTED Final   Cocaine 03/13/2023 NONE DETECTED  NONE DETECTED Final   Benzodiazepines 03/13/2023 POSITIVE (A)  NONE DETECTED Final   Amphetamines 03/13/2023 NONE DETECTED  NONE DETECTED Final   Tetrahydrocannabinol 03/13/2023 POSITIVE (A)  NONE DETECTED Final   Barbiturates 03/13/2023 NONE DETECTED  NONE DETECTED Final   Comment: (NOTE) DRUG SCREEN FOR MEDICAL PURPOSES ONLY.  IF CONFIRMATION IS NEEDED FOR ANY PURPOSE, NOTIFY LAB WITHIN 5 DAYS.  LOWEST DETECTABLE LIMITS FOR URINE DRUG SCREEN Drug Class                     Cutoff (ng/mL) Amphetamine and metabolites    1000 Barbiturate and metabolites    200 Benzodiazepine                 200 Opiates and metabolites        300 Cocaine and metabolites        300 THC  50 Performed at [facility], [address] [place], [place] [address]   Admission on 11/15/2022, Discharged on 11/25/2022  Component Date Value Ref Range Status   Sodium 11/19/2022 135  135 - 145 mmol/L Final   Potassium 11/19/2022 3.8  3.5 - 5.1 mmol/L Final   Chloride 11/19/2022 98  98 - 111 mmol/L Final   CO2 11/19/2022 24  22 - 32 mmol/L Final   Glucose, Bld 11/19/2022 96  70 - 99 mg/dL Final   Glucose reference range applies only to samples taken after fasting for at least 8 hours.   BUN 11/19/2022 9  6 - 20 mg/dL Final   Creatinine, Ser 11/19/2022 0.75  0.61 - 1.24 mg/dL Final   Calcium 89/88/7975 8.9  8.9 - 10.3 mg/dL Final   GFR, Estimated 11/19/2022 >60  >60 mL/min Final   Comment: (NOTE) Calculated using the CKD-EPI Creatinine Equation (2021)    Anion gap 11/19/2022 13  5 - 15 Final   Performed at [facility], [address] [place], [place] [address]   WBC 11/19/2022 8.9  4.0 - 10.5 K/uL Final   RBC 11/19/2022 4.56  4.22 - 5.81 MIL/uL Final   Hemoglobin 11/19/2022 14.3  13.0 - 17.0 g/dL Final   HCT 89/88/7975 43.3  39.0 - 52.0 % Final   MCV 11/19/2022 95.0  80.0 - 100.0 fL Final   MCH 11/19/2022 31.4  26.0 - 34.0 pg Final   MCHC 11/19/2022 33.0  30.0 - 36.0 g/dL Final   RDW 89/88/7975 12.2  11.5 - 15.5 % Final   Platelets 11/19/2022 337  150 - 400 K/uL Final   nRBC 11/19/2022 0.0  0.0 - 0.2 % Final   Performed at [facility], [address] [place], [place] [address]   Glucose-Capillary 11/19/2022 98  70 - 99 mg/dL Final   Glucose reference range applies only to samples taken after fasting for at least 8 hours.  Admission on 11/14/2022, Discharged on 11/15/2022  Component Date Value Ref Range Status   Sodium 11/14/2022 136  135 - 145 mmol/L Final   Potassium 11/14/2022 4.0  3.5 - 5.1 mmol/L Final   Chloride 11/14/2022 100  98 - 111 mmol/L Final   CO2 11/14/2022 27  22 - 32 mmol/L Final   Glucose, Bld 11/14/2022  117 (H)  70 - 99 mg/dL Final   Glucose reference range applies only to samples taken after fasting for at least 8 hours.   BUN 11/14/2022 7  6 - 20 mg/dL Final   Creatinine, Ser 11/14/2022 0.84  0.61 - 1.24 mg/dL Final   Calcium 89/93/7975 8.8 (L)  8.9 - 10.3 mg/dL Final   Total Protein 89/93/7975 7.7  6.5 - 8.1 g/dL Final   Albumin 89/93/7975 4.2  3.5 - 5.0 g/dL Final   AST 89/93/7975 12 (L)  15 - 41 U/L Final   ALT 11/14/2022 11  0 - 44 U/L Final   Alkaline Phosphatase 11/14/2022 59  38 - 126 U/L Final   Total Bilirubin 11/14/2022 1.4 (H)  0.3 - 1.2 mg/dL Final   GFR, Estimated 11/14/2022 >60  >60 mL/min Final   Comment: (NOTE) Calculated using the CKD-EPI Creatinine Equation (2021)    Anion gap 11/14/2022 9  5 - 15 Final   Performed at [facility], [address] [place], [place] [address]   Alcohol, Ethyl (B) 11/14/2022 <10  <10 mg/dL Final   Comment: (NOTE) Lowest detectable limit for serum  alcohol is 10 mg/dL.  For medical purposes only. Performed at [facility], [address], [place], [place] [address]    WBC 11/14/2022 4.7  4.0 - 10.5 K/uL Final   RBC 11/14/2022 4.59  4.22 - 5.81 MIL/uL Final   Hemoglobin 11/14/2022 14.9  13.0 - 17.0 g/dL Final   HCT 89/93/7975 44.5  39.0 - 52.0 % Final   MCV 11/14/2022 96.9  80.0 - 100.0 fL Final   MCH 11/14/2022 32.5  26.0 - 34.0 pg Final   MCHC 11/14/2022 33.5  30.0 - 36.0 g/dL Final   RDW 89/93/7975 12.8  11.5 - 15.5 % Final   Platelets 11/14/2022 319  150 - 400 K/uL Final   nRBC 11/14/2022 0.0  0.0 - 0.2 % Final   Performed at [facility], [address] [place], [place] [address]   Opiates 11/14/2022 NONE DETECTED  NONE DETECTED Final   Cocaine 11/14/2022 NONE DETECTED  NONE DETECTED Final   Benzodiazepines 11/14/2022 POSITIVE (A)  NONE DETECTED Final   Amphetamines 11/14/2022 NONE DETECTED  NONE DETECTED Final   Tetrahydrocannabinol 11/14/2022 NONE DETECTED  NONE  DETECTED Final   Barbiturates 11/14/2022 NONE DETECTED  NONE DETECTED Final   Comment: (NOTE) DRUG SCREEN FOR MEDICAL PURPOSES ONLY.  IF CONFIRMATION IS NEEDED FOR ANY PURPOSE, NOTIFY LAB WITHIN 5 DAYS.  LOWEST DETECTABLE LIMITS FOR URINE DRUG SCREEN Drug Class                     Cutoff (ng/mL) Amphetamine and metabolites    1000 Barbiturate and metabolites    200 Benzodiazepine                 200 Opiates and metabolites        300 Cocaine and metabolites        300 THC                            50 Performed at [facility], [address] [place], [place] [address]     Blood Alcohol level:  Lab Results  Component Value Date   ETH <10 03/13/2023   ETH <10 11/14/2022    Metabolic Disorder Labs: No results found for: HGBA1C, MPG No results found for: PROLACTIN No results found for: CHOL, TRIG, HDL, CHOLHDL, VLDL, LDLCALC  Therapeutic Lab Levels: No results found for: LITHIUM No results found for: VALPROATE No results found for: CBMZ  Physical Findings   AUDIT    Flowsheet Row ED to Hosp-Admission (Discharged) [facility]  Alcohol Use Disorder Identification Test Final Score (AUDIT) 0      PHQ2-9    Flowsheet Row ED from 03/13/2023 in [facility]  PHQ-2 Total Score 4  PHQ-9 Total Score 13      Flowsheet Row ED from 03/13/2023 in [facility] Most recent reading at 03/13/2023 12:27 PM ED from 03/13/2023 in [facility] Emergency Department at [facility] Most recent reading at 03/13/2023  3:02 AM ED from 03/05/2023 in [facility] Emergency Department at [facility] Most recent reading at 03/05/2023 12:02 PM  C-SSRS RISK CATEGORY Low Risk Low Risk No Risk        Musculoskeletal  Strength & Muscle Tone: within normal limits Gait & Station: normal Patient leans: N/A  Psychiatric Specialty Exam   Presentation  General Appearance:  Appropriate for Environment  Eye Contact: Fair  Speech: Normal Rate  Speech Volume: Normal  Handedness: Right   Mood and Affect  Mood: Anxious  Affect: Congruent   Thought Process  Thought Processes: Coherent  Descriptions of Associations:Intact  Orientation:Full (Time, Place and Person)  Thought Content:WDL  Diagnosis of Schizophrenia or Schizoaffective disorder in past: No    Hallucinations:No data recorded  Ideas of Reference:None  Suicidal Thoughts:No data recorded  Homicidal Thoughts:No data recorded   Sensorium  Memory: Immediate Fair  Judgment: Fair  Insight: Fair   Art Therapist  Concentration: Fair  Attention Span: Fair  Recall: Fair  Fund of Knowledge: Good  Language: Good   Psychomotor Activity  Psychomotor Activity: No data recorded   Assets  Assets: Communication Skills; Desire for Improvement   Sleep  Sleep: No data recorded   No data recorded  Physical Exam  Physical exam: Please see exam on admit note. General: Well developed, well nourished.  Pupils: Normal at 3mm Respiratory: Breathing is unlabored.  Cardiovascular: No edema.  Language: No anomia, no aphasia Muscle strength and tone-pt moving all extremities.  Gait not assessed as pt remained in bed.  Neuro: Facial muscles are symmetric. Pt without tremor, no evidence of hyperarousal.  Review of Systems  Constitutional: Negative.   Eyes: Negative.   Respiratory: Negative.    Cardiovascular: Negative.   Gastrointestinal: Negative.   Genitourinary: Negative.   Musculoskeletal: Negative.   Skin: Negative.   Neurological: Negative.   Endo/Heme/Allergies: Negative.   Psychiatric/Behavioral:  Positive for depression and substance abuse. The patient is nervous/anxious and has insomnia.    Blood pressure 92/65, pulse 78, temperature 98.1 F (36.7 C), temperature source Oral, resp. rate 18, SpO2 98%.  There is no height or weight on file to calculate BMI.  Treatment Plan Summary: Daily contact with patient to assess and evaluate symptoms and progress in treatment, Medication management, and Plan )  -cont Seroquel  100 mg at bedtime for PTSD symptoms -Continue Seroquel  50mg  [id number] for intrusive memories, insomnia, anxiety -continue Effexor  75 mg daily with breakfast -Continue buspirone  10 mg 3 times daily -Continue nicotine  patch 21 mg for NRT  -Continue gabapentin  200 mg 3 times daily - stopped hydroxyzine  due to ineffectiveness - reviewed allergy list with patient and he states gabapentin  is not a true allergy and he would like to retry it given panic attacks, will restart 200 mg TID after discussion of r/b/a with patient -cont Buspar  10 mg TID  Dispo: discharge 2/9 or 2/10. Referral to IOP sent  Prentice Espy, MD 03/19/2023 8:54 AM

## 2023-03-19 NOTE — Progress Notes (Signed)
 Meal given to pt.

## 2023-03-19 NOTE — ED Notes (Signed)
 Patient is in the bedroom calm and sleeping. NAD. Respirations are even and unlabored. Will continue to monitor for safety.

## 2023-03-19 NOTE — ED Notes (Signed)
 Patient is sleeping. Respirations equal and unlabored, skin warm and dry. No change in assessment or acuity. Routine safety checks conducted according to facility protocol. Will continue to monitor for safety.

## 2023-03-19 NOTE — ED Notes (Signed)
 Pt sitting in dayroom watching television. No acute distress noted. No concerns voiced. Informed pt to notify staff with any needs or assistance. Pt verbalized understanding and agreement. Will continue to monitor for safety.

## 2023-03-19 NOTE — Group Note (Signed)
 Group Topic: Communication  Group Date: 03/19/2023 Start Time: 0900 End Time: 1000 Facilitators: Herold Lajuana NOVAK, RN  Department: [facility]  Number of Participants: 6  Group Focus: communication Treatment Modality:  Individual Therapy Interventions utilized were patient education Purpose: increase insight  Name: Michael [name] Date of Birth: Aug 28, 1975  MR: [id number]    Level of Participation: active Quality of Participation: cooperative Interactions with others: gave feedback Mood/Affect: appropriate Triggers (if applicable): none identified Cognition: coherent/clear Progress: Gaining insight Response: verbalized understanding Plan: patient will be encouraged to remain compliant with medications and notify staff with any concerns   Patients Problems:  Patient Active Problem List   Diagnosis Date Noted   MDD (major depressive disorder) 03/13/2023   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

## 2023-03-19 NOTE — ED Notes (Signed)
 Pt is in the dayroom watching TV with peers. Pt denies SI/HI/AVH. Pt has no further complain.No acute distress noted. Will continue to monitor for safety and provide support.

## 2023-03-19 NOTE — Group Note (Signed)
 Group Topic: Relapse and Recovery  Group Date: 03/19/2023 Start Time: 2000 End Time: 2100 Facilitators: Joan Plowman B  Department: [facility]  Number of Participants: 4  Group Focus: abuse issues Treatment Modality:  Exposure Therapy Interventions utilized were leisure development Purpose: express feelings and increase insight  Name: Michael [name] Date of Birth: 1975-07-13  MR: [id number]    Level of Participation: active Quality of Participation: cooperative Interactions with others: gave feedback Mood/Affect: appropriate Triggers (if applicable): N/A Cognition: coherent/clear Progress: Gaining insight Response: N/A Plan: patient will be encouraged to keep attending group.  Patients Problems:  Patient Active Problem List   Diagnosis Date Noted   MDD (major depressive disorder) 03/13/2023   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

## 2023-03-20 DIAGNOSIS — F332 Major depressive disorder, recurrent severe without psychotic features: Secondary | ICD-10-CM | POA: Diagnosis not present

## 2023-03-20 MED ORDER — GABAPENTIN 100 MG PO CAPS: 200.0000 mg | ORAL_CAPSULE | Freq: Three times a day (TID) | ORAL | 0 refills | Status: DC

## 2023-03-20 MED ORDER — NICOTINE 21 MG/24HR TD PT24
21.0000 mg | MEDICATED_PATCH | Freq: Every day | TRANSDERMAL | 0 refills | Status: DC
Start: 2023-03-21 — End: 2023-04-04

## 2023-03-20 MED ORDER — VENLAFAXINE HCL ER 75 MG PO CP24: 75.0000 mg | ORAL_CAPSULE | Freq: Every day | ORAL | 0 refills | Status: DC

## 2023-03-20 MED ORDER — BUSPIRONE HCL 10 MG PO TABS
10.0000 mg | ORAL_TABLET | Freq: Three times a day (TID) | ORAL | 0 refills | Status: DC
Start: 2023-03-20 — End: 2023-04-03

## 2023-03-20 MED ORDER — QUETIAPINE FUMARATE 100 MG PO TABS: ORAL_TABLET | ORAL | 0 refills | Status: DC

## 2023-03-20 NOTE — Group Note (Signed)
 Group Topic: Relapse and Recovery  Group Date: 03/20/2023 Start Time: 1000 End Time: 1100 Facilitators: Lonzell Dwayne RAMAN, NT +3 MHT 2 Department: [facility]  Number of Participants: 7  Group Focus: chemical dependency issues Treatment Modality:  Psychoeducation Interventions utilized were group exercise Purpose: relapse prevention strategies  Name: Michael [name] Date of Birth: 1975-09-08  MR: [id number]    Level of Participation: Patient refused Group  Quality of Participation: N/A Interactions with others: N/A Mood/Affect: N/A Triggers (if applicable): N/A Cognition: N/A Progress: N/A Response: N/A Plan: N/A  Patients Problems:  Patient Active Problem List   Diagnosis Date Noted   MDD (major depressive disorder) 03/13/2023   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

## 2023-03-20 NOTE — ED Provider Notes (Signed)
 [facility]/OBS ASAP Discharge Summary  Date and Time: 03/20/2023 1:08 PM  Name: Michael [name]  MRN:  [id number]   Discharge Diagnoses:  Final diagnoses:  MDD (major depressive disorder), recurrent severe, without psychosis (HCC)  GAD (generalized anxiety disorder)  Trauma and stressor-related disorder    Subjective: Patient seen and assessed at bedside.  Denies SI/HI/AVH.  Reports that eating and sleeping remained appropriate.  I discussed with him intensive outpatient program for management of chronic anxiety and he was amenable to pursuing this option.  We discussed that he would have an assessment tomorrow to evaluate for appropriateness and orient him to IOP.  He verbalized understanding and stated he did have a location where he would be able to have privacy and Internet access.  He was amenable to discharge today.  Stay Summary by Problem List Michael [name] is a 48 y.o.male with a history of major depressive disorder, generalized anxiety disorder with panic attacks who was admitted to [facility] for worsening anxiety and panic attacks. Hospital course is detailed below:  Generalized anxiety disorder with panic Major depressive disorder, moderate, recurrent episode Patient previously had his anxiety managed through daily Xanax  use prescribed by outpatient psychiatrist.  However, patient self tapered off Xanax  given dependence concerns.  However, patient had worsening anxiety ever since.  He was started on several medications to aid with his anxiety including gabapentin , buspirone , Seroquel , and Effexor .  Discussed how these would be likely consolidated once his anxiety is better controlled.  He was referred to intensive outpatient and has an assessment on 03/21/2023 to orient him to the program.    Total Time spent with patient: 45 minutes  Past Psychiatric History: The patient has a longstanding history of major depression, generalized anxiety disorder and panic attacks.  His previous admission to  [facility] was in October.  He was discharged with outpatient follow-up and was placed on BuSpar  10 mg twice a day, trazodone  50 mg at bedtime and Effexor  XR 150 mg a day.  Patient essentially stopped all the medications.   Past Medical History:  Past Medical History:  Diagnosis Date   Anxiety    TGA (transient global amnesia)    TIA (transient ischemic attack)    No past surgical history on file. Family History: No family history on file. Social History:  Social History   Substance and Sexual Activity  Alcohol Use Not Currently   Comment: rarely     Social History   Substance and Sexual Activity  Drug Use No    Social History   Socioeconomic History   Marital status: Single    Spouse name: Not on file   Number of children: Not on file   Years of education: Not on file   Highest education level: Not on file  Occupational History   Not on file  Tobacco Use   Smoking status: Some Days    Current packs/day: 0.50    Average packs/day: 0.5 packs/day for 20.0 years (10.0 ttl pk-yrs)    Types: Cigarettes   Smokeless tobacco: Not on file  Vaping Use   Vaping status: Every Day   Substances: Nicotine , Flavoring  Substance and Sexual Activity   Alcohol use: Not Currently    Comment: rarely   Drug use: No   Sexual activity: Yes    Birth control/protection: None  Other Topics Concern   Not on file  Social History Narrative   Not on file   Social Drivers of Health   Financial Resource Strain: Not  on file  Food Insecurity: Food Insecurity Present (03/13/2023)   Hunger Vital Sign    Worried About Running Out of Food in the Last Year: Often true    Ran Out of Food in the Last Year: Often true  Transportation Needs: Unmet Transportation Needs (03/13/2023)   PRAPARE - Administrator, Civil Service (Medical): Yes    Lack of Transportation (Non-Medical): Yes  Physical Activity: Not on file  Stress: Not on file  Social Connections: Unknown (09/01/2022)   [facility]   Social Network    Social Network: Not on file   SDOH:  SDOH Screenings   Food Insecurity: Food Insecurity Present (03/13/2023)  Housing: Low Risk  (11/15/2022)  Transportation Needs: Unmet Transportation Needs (03/13/2023)  Utilities: At Risk (03/13/2023)  Alcohol Screen: Low Risk  (11/15/2022)  Depression (PHQ2-9): Low Risk  (03/20/2023)  Recent Concern: Depression (PHQ2-9) - High Risk (03/13/2023)  Social Connections: Unknown (09/01/2022)   Received from [facility]  Tobacco Use: High Risk (03/13/2023)    Tobacco Cessation:  N/A, patient does not currently use tobacco products  Current Medications:  Current Facility-Administered Medications  Medication Dose Route Frequency Provider Last Rate Last Admin   alum & mag hydroxide-simeth (MAALOX/MYLANTA) 200-200-20 MG/5ML suspension 30 mL  30 mL Oral Q4H PRN Lewis, Tanika N, NP       busPIRone  (BUSPAR ) tablet 10 mg  10 mg Oral TID Goli, Veeraindar, MD   10 mg at 03/20/23 0915   gabapentin  (NEURONTIN ) capsule 200 mg  200 mg Oral TID Zouev, Dmitri, MD   200 mg at 03/20/23 0915   haloperidol  (HALDOL ) tablet 5 mg  5 mg Oral TID PRN Ezzard Staci SAILOR, NP       magnesium  hydroxide (MILK OF MAGNESIA) suspension 30 mL  30 mL Oral Daily PRN Lewis, Tanika N, NP       nicotine  (NICODERM CQ  - dosed in mg/24 hours) patch 21 mg  21 mg Transdermal Daily Wilkie Majel RAMAN, FNP   21 mg at 03/20/23 0915   QUEtiapine  (SEROQUEL ) tablet 100 mg  100 mg Oral QHS Zouev, Dmitri, MD   100 mg at 03/19/23 2114   QUEtiapine  (SEROQUEL ) tablet 50 mg  50 mg Oral Q6H PRN Zouev, Dmitri, MD   50 mg at 03/20/23 1101   venlafaxine  XR (EFFEXOR -XR) 24 hr capsule 75 mg  75 mg Oral Q breakfast Zouev, Dmitri, MD   75 mg at 03/20/23 0915   Current Outpatient Medications  Medication Sig Dispense Refill   busPIRone  (BUSPAR ) 10 MG tablet Take 1 tablet (10 mg total) by mouth 3 (three) times daily. 90 tablet 0   gabapentin  (NEURONTIN ) 100 MG capsule Take 2 capsules (200 mg  total) by mouth 3 (three) times daily. 180 capsule 0   [START ON 03/21/2023] nicotine  (NICODERM CQ  - DOSED IN MG/24 HOURS) 21 mg/24hr patch Place 1 patch (21 mg total) onto the skin daily. 28 patch 0   QUEtiapine  (SEROQUEL ) 100 MG tablet Take 1 tablet (100 mg total) by mouth at bedtime. May also take 0.5 tablets (50 mg total) 3 (three) times daily as needed (severe anxiety). 60 tablet 0   [START ON 03/21/2023] venlafaxine  XR (EFFEXOR -XR) 75 MG 24 hr capsule Take 1 capsule (75 mg total) by mouth daily with breakfast. 30 capsule 0    PTA Medications: (Not in a hospital admission)       03/20/2023    9:16 AM 03/13/2023   12:27 PM  Depression screen  PHQ 2/9  Decreased Interest 2 2  Down, Depressed, Hopeless 2 2  PHQ - 2 Score 4 4  Altered sleeping  1  Tired, decreased energy  1  Change in appetite  1  Feeling bad or failure about yourself   2  Trouble concentrating  1  Moving slowly or fidgety/restless  2  Suicidal thoughts  1  PHQ-9 Score  13  Difficult doing work/chores  Very difficult    Flowsheet Row ED from 03/13/2023 in [facility] Most recent reading at 03/13/2023 12:27 PM ED from 03/13/2023 in [facility] Emergency Department at [facility] Most recent reading at 03/13/2023  3:02 AM ED from 03/05/2023 in [facility] Emergency Department at [facility] Most recent reading at 03/05/2023 12:02 PM  C-SSRS RISK CATEGORY Low Risk Low Risk No Risk       Musculoskeletal  Strength & Muscle Tone: within normal limits Gait & Station: normal Patient leans: N/A  Psychiatric Specialty Exam  Presentation  General Appearance:  Appropriate for Environment; Casual   Eye Contact: Good   Speech: Clear and Coherent; Normal Rate   Speech Volume: Normal   Handedness: Right    Mood and Affect  Mood: Euthymic   Affect: Appropriate; Congruent    Thought Process  Thought Processes: Coherent; Goal Directed; Linear   Descriptions  of Associations:Intact   Orientation:Full (Time, Place and Person)   Thought Content:Logical      Hallucinations:Hallucinations: None   Ideas of Reference:None   Suicidal Thoughts:Suicidal Thoughts: No   Homicidal Thoughts:Homicidal Thoughts: No    Sensorium  Memory: Immediate Good; Recent Good; Remote Good   Judgment: Fair   Insight: Fair    Art Therapist  Concentration: Good   Attention Span: Good   Recall: Good   Fund of Knowledge: Good   Language: Good    Psychomotor Activity  Psychomotor Activity:Psychomotor Activity: Normal    Assets  Assets: Communication Skills; Desire for Improvement; Physical Health    Sleep  Sleep:Sleep: Good    No data recorded   Physical Exam  Physical Exam ROS Blood pressure (!) 118/29, pulse 87, temperature 98.1 F (36.7 C), temperature source Oral, resp. rate 18, SpO2 98%. There is no height or weight on file to calculate BMI.  Demographic Factors:  Caucasian, Low socioeconomic status, and Living alone  Loss Factors: Loss of significant relationship and Financial problems/change in socioeconomic status  Historical Factors: NA  Risk Reduction Factors:   Positive social support, Positive therapeutic relationship, and Positive coping skills or problem solving skills  Continued Clinical Symptoms:  Severe Anxiety and/or Agitation Panic Attacks  Cognitive Features That Contribute To Risk:  None    Suicide Risk:  Minimal: No identifiable suicidal ideation.  Patients presenting with no risk factors but with morbid ruminations; may be classified as minimal risk based on the severity of the depressive symptoms  Plan Of Care/Follow-up recommendations:   Follow-up recommendations:   Activity:  as tolerated Diet:  heart healthy   Comments:  Prescriptions were given at discharge.  Patient is agreeable with the discharge plan.  Patient was given an opportunity to ask questions.   Patient appears to feel comfortable with discharge and denies any current suicidal or homicidal thoughts.    Patient is instructed prior to discharge to: Take all medications as prescribed by mental healthcare provider. Report any adverse effects and or reactions from the medicines to outpatient provider promptly. In the event of worsening symptoms, patient is instructed  to call the crisis hotline, 911 and or go to the nearest ED for appropriate evaluation and treatment of symptoms. Patient is to follow-up with primary care provider for other medical issues, concerns and or health care needs.   Prentice Espy, MD 03/20/2023, 1:08 PM

## 2023-03-21 ENCOUNTER — Telehealth (HOSPITAL_COMMUNITY): Payer: Self-pay | Admitting: Psychiatry

## 2023-03-28 ENCOUNTER — Other Ambulatory Visit: Payer: Self-pay

## 2023-03-28 ENCOUNTER — Emergency Department (EMERGENCY_DEPARTMENT_HOSPITAL)
Admission: EM | Admit: 2023-03-28 | Discharge: 2023-03-28 | Disposition: A | Discharge status: Other Acute Inpatient Hospital | Payer: BLUE CROSS/BLUE SHIELD | Source: Home / Self Care | Attending: Emergency Medicine | Admitting: Emergency Medicine

## 2023-03-28 ENCOUNTER — Inpatient Hospital Stay (HOSPITAL_COMMUNITY)
Admission: AD | Admit: 2023-03-28 | Discharge: 2023-04-04 | DRG: 885 | Disposition: A | Discharge status: Rehabilitation Facility | Payer: BLUE CROSS/BLUE SHIELD | Source: Intra-hospital | Attending: Psychiatry | Admitting: Psychiatry

## 2023-03-28 ENCOUNTER — Encounter (HOSPITAL_COMMUNITY): Payer: Self-pay | Admitting: Psychiatry

## 2023-03-28 ENCOUNTER — Encounter (HOSPITAL_COMMUNITY): Payer: Self-pay | Admitting: Emergency Medicine

## 2023-03-28 DIAGNOSIS — F17290 Nicotine dependence, other tobacco product, uncomplicated: Secondary | ICD-10-CM | POA: Diagnosis present

## 2023-03-28 DIAGNOSIS — R45851 Suicidal ideations: Secondary | ICD-10-CM | POA: Insufficient documentation

## 2023-03-28 DIAGNOSIS — F411 Generalized anxiety disorder: Secondary | ICD-10-CM | POA: Diagnosis present

## 2023-03-28 DIAGNOSIS — R780 Finding of alcohol in blood: Secondary | ICD-10-CM | POA: Insufficient documentation

## 2023-03-28 DIAGNOSIS — Z91410 Personal history of adult physical and sexual abuse: Secondary | ICD-10-CM | POA: Diagnosis not present

## 2023-03-28 DIAGNOSIS — Z5986 Financial insecurity: Secondary | ICD-10-CM

## 2023-03-28 DIAGNOSIS — Z91411 Personal history of adult psychological abuse: Secondary | ICD-10-CM

## 2023-03-28 DIAGNOSIS — F332 Major depressive disorder, recurrent severe without psychotic features: Principal | ICD-10-CM | POA: Diagnosis present

## 2023-03-28 DIAGNOSIS — Z811 Family history of alcohol abuse and dependence: Secondary | ICD-10-CM

## 2023-03-28 DIAGNOSIS — F410 Panic disorder [episodic paroxysmal anxiety] without agoraphobia: Secondary | ICD-10-CM | POA: Diagnosis present

## 2023-03-28 DIAGNOSIS — Z8673 Personal history of transient ischemic attack (TIA), and cerebral infarction without residual deficits: Secondary | ICD-10-CM | POA: Diagnosis not present

## 2023-03-28 DIAGNOSIS — Z91141 Patient's other noncompliance with medication regimen due to financial hardship: Secondary | ICD-10-CM | POA: Diagnosis not present

## 2023-03-28 DIAGNOSIS — Z79899 Other long term (current) drug therapy: Secondary | ICD-10-CM | POA: Diagnosis not present

## 2023-03-28 DIAGNOSIS — F17210 Nicotine dependence, cigarettes, uncomplicated: Secondary | ICD-10-CM | POA: Diagnosis present

## 2023-03-28 DIAGNOSIS — F329 Major depressive disorder, single episode, unspecified: Secondary | ICD-10-CM | POA: Diagnosis present

## 2023-03-28 DIAGNOSIS — Y900 Blood alcohol level of less than 20 mg/100 ml: Secondary | ICD-10-CM | POA: Insufficient documentation

## 2023-03-28 DIAGNOSIS — Z560 Unemployment, unspecified: Secondary | ICD-10-CM

## 2023-03-28 DIAGNOSIS — Z5900 Homelessness unspecified: Secondary | ICD-10-CM | POA: Diagnosis not present

## 2023-03-28 DIAGNOSIS — Z6372 Alcoholism and drug addiction in family: Secondary | ICD-10-CM

## 2023-03-28 DIAGNOSIS — F1320 Sedative, hypnotic or anxiolytic dependence, uncomplicated: Secondary | ICD-10-CM | POA: Diagnosis present

## 2023-03-28 DIAGNOSIS — Z9151 Personal history of suicidal behavior: Secondary | ICD-10-CM | POA: Diagnosis not present

## 2023-03-28 LAB — COMPREHENSIVE METABOLIC PANEL
ALT: 15 U/L (ref 0–44)
AST: 18 U/L (ref 15–41)
Albumin: 3.8 g/dL (ref 3.5–5.0)
Alkaline Phosphatase: 55 U/L (ref 38–126)
Anion gap: 12 (ref 5–15)
BUN: 6 mg/dL (ref 6–20)
CO2: 24 mmol/L (ref 22–32)
Calcium: 8.8 mg/dL — ABNORMAL LOW (ref 8.9–10.3)
Chloride: 103 mmol/L (ref 98–111)
Creatinine, Ser: 0.75 mg/dL (ref 0.61–1.24)
GFR, Estimated: >60 mL/min (ref >60)
Glucose, Bld: 106 mg/dL — ABNORMAL HIGH (ref 70–99)
Potassium: 3.4 mmol/L — ABNORMAL LOW (ref 3.5–5.1)
Sodium: 139 mmol/L (ref 135–145)
Total Bilirubin: 0.4 mg/dL (ref 0.0–1.2)
Total Protein: 7.4 g/dL (ref 6.5–8.1)

## 2023-03-28 LAB — CBC
HCT: 42.1 % (ref 39.0–52.0)
Hemoglobin: 14.2 g/dL (ref 13.0–17.0)
MCH: 31.2 pg (ref 26.0–34.0)
MCHC: 33.7 g/dL (ref 30.0–36.0)
MCV: 92.5 fL (ref 80.0–100.0)
Platelets: 383 10*3/uL (ref 150–400)
RBC: 4.55 MIL/uL (ref 4.22–5.81)
RDW: 12.8 % (ref 11.5–15.5)
WBC: 6.1 10*3/uL (ref 4.0–10.5)
nRBC: 0 % (ref 0.0–0.2)

## 2023-03-28 LAB — SALICYLATE LEVEL: Salicylate Lvl: <7 mg/dL — ABNORMAL LOW (ref 7.0–30.0)

## 2023-03-28 LAB — ETHANOL: Alcohol, Ethyl (B): 14 mg/dL — ABNORMAL HIGH (ref <10)

## 2023-03-28 LAB — RAPID URINE DRUG SCREEN, HOSP PERFORMED
Amphetamines: NOT DETECTED
Barbiturates: NOT DETECTED
Benzodiazepines: NOT DETECTED
Cocaine: NOT DETECTED
Opiates: NOT DETECTED
Tetrahydrocannabinol: POSITIVE — AB

## 2023-03-28 LAB — ACETAMINOPHEN LEVEL: Acetaminophen (Tylenol), Serum: <10 ug/mL — ABNORMAL LOW (ref 10–30)

## 2023-03-28 MED ORDER — HYDROXYZINE HCL 25 MG PO TABS: 25.0000 mg | ORAL_TABLET | Freq: Three times a day (TID) | ORAL | Status: DC | PRN

## 2023-03-28 MED ORDER — OLANZAPINE 5 MG PO TBDP
5.0000 mg | ORAL_TABLET | Freq: Three times a day (TID) | ORAL | Status: DC | PRN
  Administered 2023-03-28 – 2023-03-30 (×2): 5 mg via ORAL
  Filled 2023-03-28 (×2): qty 1

## 2023-03-28 MED ORDER — DIPHENHYDRAMINE HCL 50 MG/ML IJ SOLN: 50.0000 mg | Freq: Three times a day (TID) | INTRAMUSCULAR | Status: DC | PRN

## 2023-03-28 MED ORDER — HYDROXYZINE HCL 25 MG PO TABS: 25.0000 mg | ORAL_TABLET | Freq: Once | ORAL | Status: DC

## 2023-03-28 MED ORDER — HALOPERIDOL LACTATE 5 MG/ML IJ SOLN: 10.0000 mg | Freq: Three times a day (TID) | INTRAMUSCULAR | Status: DC | PRN

## 2023-03-28 MED ORDER — HALOPERIDOL LACTATE 5 MG/ML IJ SOLN: 5.0000 mg | Freq: Three times a day (TID) | INTRAMUSCULAR | Status: DC | PRN

## 2023-03-28 MED ORDER — QUETIAPINE FUMARATE 100 MG PO TABS
100.0000 mg | ORAL_TABLET | Freq: Every day | ORAL | Status: DC
Start: 2023-03-28 — End: 2023-03-28

## 2023-03-28 MED ORDER — NICOTINE 14 MG/24HR TD PT24
14.0000 mg | MEDICATED_PATCH | Freq: Every day | TRANSDERMAL | Status: DC
  Administered 2023-03-29 – 2023-04-03 (×6): 14 mg via TRANSDERMAL
  Filled 2023-03-28 (×6): qty 1
  Filled 2023-03-28: qty 14
  Filled 2023-03-28 (×2): qty 1

## 2023-03-28 MED ORDER — LORAZEPAM 0.5 MG PO TABS
0.5000 mg | ORAL_TABLET | Freq: Once | ORAL | Status: AC
  Administered 2023-03-28: 0.5 mg via ORAL
  Filled 2023-03-28: qty 1

## 2023-03-28 MED ORDER — NICOTINE 21 MG/24HR TD PT24
21.0000 mg | MEDICATED_PATCH | Freq: Every day | TRANSDERMAL | Status: DC
  Administered 2023-03-28: 21 mg via TRANSDERMAL
  Filled 2023-03-28: qty 1

## 2023-03-28 MED ORDER — HYDROXYZINE HCL 25 MG PO TABS
25.0000 mg | ORAL_TABLET | Freq: Three times a day (TID) | ORAL | Status: DC | PRN
  Administered 2023-03-28 – 2023-04-01 (×5): 25 mg via ORAL
  Filled 2023-03-28 (×3): qty 1
  Filled 2023-03-28: qty 20
  Filled 2023-03-28 (×2): qty 1

## 2023-03-28 MED ORDER — VENLAFAXINE HCL ER 75 MG PO CP24
75.0000 mg | ORAL_CAPSULE | Freq: Every day | ORAL | Status: DC
Start: 2023-03-28 — End: 2023-03-28
  Administered 2023-03-28: 75 mg via ORAL
  Filled 2023-03-28: qty 1

## 2023-03-28 MED ORDER — QUETIAPINE FUMARATE 50 MG PO TABS
50.0000 mg | ORAL_TABLET | Freq: Three times a day (TID) | ORAL | Status: DC | PRN
  Administered 2023-03-28: 50 mg via ORAL
  Filled 2023-03-28: qty 1

## 2023-03-28 MED ORDER — QUETIAPINE FUMARATE 50 MG PO TABS
50.0000 mg | ORAL_TABLET | Freq: Every day | ORAL | Status: DC
Start: 2023-03-28 — End: 2023-03-28

## 2023-03-28 MED ORDER — GABAPENTIN 100 MG PO CAPS
200.0000 mg | ORAL_CAPSULE | Freq: Three times a day (TID) | ORAL | Status: DC
  Administered 2023-03-28: 200 mg via ORAL
  Filled 2023-03-28: qty 2

## 2023-03-28 MED ORDER — LORAZEPAM 2 MG/ML IJ SOLN: 2.0000 mg | Freq: Three times a day (TID) | INTRAMUSCULAR | Status: DC | PRN

## 2023-03-28 MED ORDER — BUSPIRONE HCL 10 MG PO TABS
10.0000 mg | ORAL_TABLET | Freq: Three times a day (TID) | ORAL | Status: DC
  Administered 2023-03-28: 10 mg via ORAL
  Filled 2023-03-28: qty 1

## 2023-03-28 NOTE — Group Note (Signed)
 Date:  03/28/2023 Time:  10:50 PM  Group Topic/Focus:  Alcoholics Anonymous (AA) Meeting    Participation Level:  Minimal  Participation Quality:  Appropriate  Affect:  Appropriate  Cognitive:  Appropriate  Insight: Appropriate  Engagement in Group:  Engaged  Modes of Intervention:  Socialization and Support  Additional Comments:  Patient attended some of AA but did not stay the entire time  Kennieth Francois 03/28/2023, 10:50 PM

## 2023-03-28 NOTE — Tx Team (Signed)
 Initial Treatment Plan 03/28/2023 5:00 PM [name] ZOX:[id number]    PATIENT STRESSORS: Financial difficulties   Substance abuse     PATIENT STRENGTHS: Motivation for treatment/growth    PATIENT IDENTIFIED PROBLEMS: Anxiety   Depression  Substance use  Homelessness  Hopeless  Sadness  Panick         DISCHARGE CRITERIA:  Withdrawal symptoms are absent or subacute and managed without 24-hour nursing intervention  PRELIMINARY DISCHARGE PLAN: Outpatient therapy  PATIENT/FAMILY INVOLVEMENT: This treatment plan has been presented to and reviewed with the patient, Michael [name], has been given the opportunity to ask questions and make suggestions.  Melvenia Needles, RN 03/28/2023, 5:00 PM

## 2023-03-28 NOTE — Consult Note (Signed)
 [facility] Psychiatric Consult Initial  Patient Name: .Michael [name]  MRN: [id number]  DOB: 01/05/1976  Consult Order details:  Orders (From admission, onward)     Start     Ordered   03/28/23 0654  CONSULT TO CALL ACT TEAM       Ordering Provider: Smitty Knudsen, PA-C  Provider:  (Not yet assigned)  Question:  Reason for Consult?  Answer:  Psych consult   03/28/23 0653             Mode of Visit: In person    Psychiatry Consult Evaluation  Service Date: March 28, 2023 LOS:  LOS: 0 days  Chief Complaint Panic attack/Disorder, GAD  Primary Psychiatric Diagnoses  Panic Disorder 2.  GAD 3.  Recurrent Major depressive disorder, severe without Psychotic features.  Assessment  Michael [name] is a 48 y.o. male admitted: Presented to the EDfor 03/28/2023  5:27 AM for panic attack, SI and severe anxiety. He carries the psychiatric diagnoses of MDD, GAD, Panic disorder and has a past medical history of  none.   His current presentation of anxiety, depressed mood  is most consistent with diagnosis of GAD and Panic disorder.Marland Kitchen He meets criteria for inpatient hospitalization  based on his presentation of severe anxiety, not able to function, racing thoughts and lack of concentration. .  Current outpatient psychotropic medications include Seroquel, Gabapentin, Hydroxyzine and Buspirone and historically he has had a unknown  response to these medications. He was not compliant with these Medications as reported by him because they are not effective he is not compliant with medications prior to admission as evidenced by his symptoms and reports . On initial examination, patient was tearful, reports feeling restless, experiencing poor memory and cognition and unable to concentrate. . Please see plan below for detailed recommendations.   Diagnoses:  Active Hospital problems: Principal Problem:   Panic disorder Active Problems:   GAD (generalized anxiety disorder)    Plan   ##  Psychiatric Medication Recommendations:  Resume Seroquel 00 mg at bed time and 50 mg three times daily for mood/Anxiety. Effexor XR 24 hr po daily for depression/anxiety Gabapentin 200 mg po three times a day for mood control/anxiety Hydroxyzine 25 mg po tid as needed for anxiety.  ## Medical Decision Making Capacity:  Patient is his own guardian and makes his own Decision  ## Further Work-up:  - -- most recent EKG on 03/13/2023 had QtC of 454 -- Pertinent labwork reviewed earlier this admission includes: CBC, CMP, Alcohol level, UDS-Positive for Cannabis.   ## Disposition:-- We recommend transfer to [facility]  ## Behavioral / Environmental: -To minimize splitting of staff, assign one staff person to communicate all information from the team when feasible. or Utilize compassion and acknowledge the patient's experiences while setting clear and realistic expectations for care.    ## Safety and Observation Level:  - Based on my clinical evaluation, I estimate the patient to be at low risk of self harm in the current setting. - At this time, we recommend  routine. This decision is based on my review of the chart including patient's history and current presentation, interview of the patient, mental status examination, and consideration of suicide risk including evaluating suicidal ideation, plan, intent, suicidal or self-harm behaviors, risk factors, and protective factors. This judgment is based on our ability to directly address suicide risk, implement suicide prevention strategies, and develop a safety plan while the patient is in the clinical setting. Please  contact our team if there is a concern that risk level has changed.  CSSR Risk Category:C-SSRS RISK CATEGORY: Moderate Risk  Suicide Risk Assessment: Patient has following modifiable risk factors for suicide: untreated depression, under treated depression , medication noncompliance, and lack of access  to outpatient mental health resources, which we are addressing by recommending treatment at [facility] for stabilization.. Patient has following non-modifiable or demographic risk factors for suicide: male gender and psychiatric hospitalization Patient has the following protective factors against suicide: no history of suicide attempts  Thank you for this consult request. Recommendations have been communicated to the primary team.  We will recommend admission at [facility] while we resume home medications here in the ER at this time.   Earney Navy, NP-PMHNP-BC       History of Present Illness  Relevant Aspects of Hospital ED Course:  Admitted on 03/28/2023 for SI.Depression, anxiety disorder, Panic attack.   Patient, Michael [name], 48 years old was brought in by EMS for SI with no plan due to anxiety and panic attack.  Patient was seen this morning by this provider and patient presented anxious affect, ringing his fingers and tearful.  Patient reports that he was diagnosed with anxiety disorder at age 57 and that he tried many Medications but failed.  Nothing was effective until he was started on Xanax.  He was on Xanax for some time but same was discontinued last October when he was hospitalized in [facility].  Patient was seen early this moth at [facility] for anxiety stating at the tine that his current Medications were not effective and that Xanax was the only effective Medication.  He was discharged on Seroquel and Gabapentin with Hydroxyzine and Venlafaxine.  He reports he took them but he did not get getter and he felt more anxious.  Review of  PDMP shows he was given 12 tablets of Xanax  in January for weaning process.  After using the Xanax he came back to the ER.  Patient insists this morning that Xanax is the only Medication that works for him.  Provider educated patient that he has developed tolerance for Xanax leading to his being addicted to it.  He would not say if he has been taking is recent Medications  prescribed for him.  Patient reports poor sleep, severe anxiety, poor concentration and poor Memory as his symptoms.  He is unemployed, he is homeless and reports he has no family members.  When he was discharged on the 9th he was advised to follow up with outpatient Psychiatry and possible counseling but he said he did not do so.. Patient is alert and oriented x4, he denies SI/HI/AVH but reports feeling hopeless and helpless due to severe anxiety and panic attack.  We will admit at [facility] for another round of inpatient treatment and monitoring of patient.  Psych ROS:  Depression: yes Anxiety:  yes Mania (lifetime and current): na Psychosis: (lifetime and current): na  Collateral information:  Contacted :NA, States he has no family members.  Review of Systems  Constitutional: Negative.      Psychiatric and Social History  Psychiatric History:  Information collected from patient/Record Review  Prev Dx/Sx: MDD, Anxiety, Panic Disorder Current Psych Provider: na Home Meds (current): see above Previous Med Trials: Xanax, Mirtazapine, Effexor, Hydroxyzine. Therapy: Denies  Prior Psych Hospitalization: twice-[facility], [facility]  Prior Self Harm: Denies Prior Violence: denies  Family Psych History: Depression Family Hx suicide: denies  Social History:  Developmental Hx: normal Educational Hx: GED Occupational  Hx: Unemployed Legal Hx: denies Living Situation: homeless Spiritual Hx: denies Access to weapons/lethal means: na   Substance History Alcohol: Denies  Illicit drugs: Cannabis Prescription drug abuse:  Denies Rehab hx: denies  Exam Findings  Physical Exam:  Vital Signs:  Temp:  [98 F (36.7 C)] 98 F (36.7 C) (02/17 0532) Pulse Rate:  [89] 89 (02/17 0532) Resp:  [16] 16 (02/17 0532) BP: (134)/(94) 134/94 (02/17 0532) SpO2:  [100 %] 100 % (02/17 0532) Weight:  [81.6 kg] 81.6 kg (02/17 0529) Blood pressure (!) 134/94, pulse 89, temperature 98 F (36.7 C), resp. rate 16,  height 5\' 11"  (1.803 m), weight 81.6 kg, SpO2 100%. Body mass index is 25.09 kg/m.  Physical Exam Constitutional:      Appearance: Normal appearance. He is normal weight.  HENT:     Nose: Nose normal.  Cardiovascular:     Rate and Rhythm: Normal rate and regular rhythm.  Pulmonary:     Effort: Pulmonary effort is normal.  Musculoskeletal:        General: Normal range of motion.  Skin:    General: Skin is dry.  Neurological:     Mental Status: He is alert and oriented to person, place, and time.  Psychiatric:        Attention and Perception: Attention and perception normal.        Mood and Affect: Mood is anxious and depressed.        Speech: Speech normal.        Behavior: Behavior normal. Behavior is cooperative.        Thought Content: Thought content normal.        Cognition and Memory: Cognition and memory normal.        Judgment: Judgment is impulsive.     Mental Status Exam: General Appearance: Casual, Guarded, and Neat  Orientation:  Full (Time, Place, and Person)  Memory:  Immediate;   Good Recent;   Good Remote;   Good  Concentration:  Concentration: Good and Attention Span: Good  Recall:  Good  Attention  Fair  Eye Contact:  Fair  Speech:  Clear and Coherent and Normal Rate  Language:  Good  Volume:  Normal  Mood: "anxious, depressed"  Affect:  Congruent  Thought Process:  Coherent  Thought Content:  Logical  Suicidal Thoughts:  No  Homicidal Thoughts:  No  Judgement:  Fair  Insight:  Fair  Psychomotor Activity:  Psychomotor Retardation  Akathisia:  NA  Fund of Knowledge:  Fair      Assets:  Communication Skills  Cognition:  WNL  ADL's:  Intact  AIMS (if indicated):        Other History   These have been pulled in through the EMR, reviewed, and updated if appropriate.  Family History:  The patient's family history is not on file.  Medical History: Past Medical History:  Diagnosis Date   Anxiety    TGA (transient global amnesia)    TIA  (transient ischemic attack)     Surgical History: History reviewed. No pertinent surgical history.   Medications:   Current Facility-Administered Medications:    busPIRone (BUSPAR) tablet 10 mg, 10 mg, Oral, TID, Barrett, Jamie N, PA-C, 10 mg at 03/28/23 1027   gabapentin (NEURONTIN) capsule 200 mg, 200 mg, Oral, TID, Barrett, Jamie N, PA-C, 200 mg at 03/28/23 1027   hydrOXYzine (ATARAX) tablet 25 mg, 25 mg, Oral, TID PRN, Welford Roche, Arilla Hice C, NP   nicotine (NICODERM CQ - dosed in mg/24  hours) patch 21 mg, 21 mg, Transdermal, Daily, Barrett, Jamie N, PA-C, 21 mg at 03/28/23 1033   QUEtiapine (SEROQUEL) tablet 100 mg, 100 mg, Oral, QHS **AND** QUEtiapine (SEROQUEL) tablet 50 mg, 50 mg, Oral, TID PRN, Barrett, Jamie N, PA-C, 50 mg at 03/28/23 1027   venlafaxine XR (EFFEXOR-XR) 24 hr capsule 75 mg, 75 mg, Oral, Q breakfast, Barrett, Jamie N, PA-C, 75 mg at 03/28/23 0756  Current Outpatient Medications:    busPIRone (BUSPAR) 10 MG tablet, Take 1 tablet (10 mg total) by mouth 3 (three) times daily. (Patient not taking: Reported on 03/28/2023), Disp: 90 tablet, Rfl: 0   gabapentin (NEURONTIN) 100 MG capsule, Take 2 capsules (200 mg total) by mouth 3 (three) times daily. (Patient not taking: Reported on 03/28/2023), Disp: 180 capsule, Rfl: 0   nicotine (NICODERM CQ - DOSED IN MG/24 HOURS) 21 mg/24hr patch, Place 1 patch (21 mg total) onto the skin daily. (Patient not taking: Reported on 03/28/2023), Disp: 28 patch, Rfl: 0   QUEtiapine (SEROQUEL) 100 MG tablet, Take 1 tablet (100 mg total) by mouth at bedtime. May also take 0.5 tablets (50 mg total) 3 (three) times daily as needed (severe anxiety). (Patient not taking: Reported on 03/28/2023), Disp: 60 tablet, Rfl: 0   venlafaxine XR (EFFEXOR-XR) 75 MG 24 hr capsule, Take 1 capsule (75 mg total) by mouth daily with breakfast. (Patient not taking: Reported on 03/28/2023), Disp: 30 capsule, Rfl: 0  Allergies: No Known Allergies  Earney Navy,  NP-PMHNP-BC

## 2023-03-28 NOTE — Progress Notes (Signed)
 Admission note: Patient is a 48 year old caucasian male, who voluntarily  admitted from [facility] for suicidal ideation with no plan. Patient arrived to the unit via safe transport at 1435, awake, alert, and oriented X's 4. Patient presented with a flat affect and depressed mood on arrival, he denies SI/HI/AVH, but endorses anxiety during admission assessment with him. When asked what brought patient to the [facility] unit, patient states "I came off of Xanax and ever since then I've been unable to focus, I have ringing in the ears," Patient further reports "I'm trying to see what is going on with me, trying to manage my anxiety." Patient reports being homeless, he states to living in the street. Although patient denies any drug use, but UDS was positive for mariajuana. Patient states he vapes every day, and doesn't know the amounts of vape he takes.   Pt  has orientation to unit, room and routine. Information packet given to patient and safety information discussed with him.  Admission INP armband ID verified with patient, and in place, fall risk assessment completed with Patient and  he verbalized understanding of risks associated with falls. No contraband found during skin assessment, Skin, clean-dry- intact without evidence of bruising, or skin tears and tracks marks. No acute distress noted at this time. Q 15 minutes safety observation in place. Staff will continue to provide support to patient.

## 2023-03-28 NOTE — ED Provider Notes (Signed)
 [facility] EMERGENCY DEPARTMENT AT [facility] Provider Note   CSN: [id number] Arrival date & time: 03/28/23  4098     History  Chief Complaint  Patient presents with   Suicidal    Michael [name] is a 48 y.o. male with past medical history of major depressive disorder severe and recurrent presenting to emergency room with complaint of suicidal ideation. Patient reports that he may have thought about hurting himself this morning, but he had no plan, he was just feeling hopeless, reports he does not want to kill himself now. Patient reports that he does not feel like he is "present" and he feels like he is "crazy".  Reports that he has had severe anxiety as well as depression and feeling numb.  Reports since he has been discharged recently from behavioral health stay has not been taking any of his medicines because they do not help. Reports drinking EtOH to cope with anxiety. He reports very poor sleep. Reports he does not feel like he can function. Denies regular EtOH use. Denies drug use.   Denies any chest pain, shortness of breath, fevers or chills, abdominal pain nausea vomiting or diarrhea.  HPI     Home Medications Prior to Admission medications   Medication Sig Start Date End Date Taking? Authorizing Provider  busPIRone (BUSPAR) 10 MG tablet Take 1 tablet (10 mg total) by mouth 3 (three) times daily. 03/20/23 04/19/23  Park Pope, MD  gabapentin (NEURONTIN) 100 MG capsule Take 2 capsules (200 mg total) by mouth 3 (three) times daily. 03/20/23 04/19/23  Park Pope, MD  nicotine (NICODERM CQ - DOSED IN MG/24 HOURS) 21 mg/24hr patch Place 1 patch (21 mg total) onto the skin daily. 03/21/23   Park Pope, MD  QUEtiapine (SEROQUEL) 100 MG tablet Take 1 tablet (100 mg total) by mouth at bedtime. May also take 0.5 tablets (50 mg total) 3 (three) times daily as needed (severe anxiety). 03/20/23   Park Pope, MD  venlafaxine XR (EFFEXOR-XR) 75 MG 24 hr capsule Take 1 capsule (75 mg  total) by mouth daily with breakfast. 03/21/23   Park Pope, MD      Allergies    Patient has no active allergies.    Review of Systems   Review of Systems  Psychiatric/Behavioral:  Positive for agitation.     Physical Exam Updated Vital Signs BP (!) 134/94   Pulse 89   Temp 98 F (36.7 C)   Resp 16   Ht 5\' 11"  (1.803 m)   Wt 81.6 kg   SpO2 100%   BMI 25.09 kg/m  Physical Exam Vitals and nursing note reviewed.  Constitutional:      General: He is not in acute distress.    Appearance: He is not toxic-appearing.     Comments: Appears anxious, alert, oriented and answering questions appropriately.   HENT:     Head: Normocephalic and atraumatic.  Eyes:     General: No scleral icterus.    Conjunctiva/sclera: Conjunctivae normal.  Cardiovascular:     Rate and Rhythm: Normal rate and regular rhythm.     Pulses: Normal pulses.     Heart sounds: Normal heart sounds.  Pulmonary:     Effort: Pulmonary effort is normal. No respiratory distress.     Breath sounds: Normal breath sounds.  Abdominal:     General: Abdomen is flat. Bowel sounds are normal.     Palpations: Abdomen is soft.     Tenderness: There is no abdominal tenderness.  Musculoskeletal:     Right lower leg: No edema.     Left lower leg: No edema.  Skin:    General: Skin is warm and dry.     Findings: No lesion.  Neurological:     General: No focal deficit present.     Mental Status: He is alert and oriented to person, place, and time. Mental status is at baseline.     ED Results / Procedures / Treatments   Labs (all labs ordered are listed, but only abnormal results are displayed) Labs Reviewed  COMPREHENSIVE METABOLIC PANEL - Abnormal; Notable for the following components:      Result Value   Potassium 3.4 (*)    Glucose, Bld 106 (*)    Calcium 8.8 (*)    All other components within normal limits  ETHANOL - Abnormal; Notable for the following components:   Alcohol, Ethyl (B) 14 (*)    All other  components within normal limits  SALICYLATE LEVEL - Abnormal; Notable for the following components:   Salicylate Lvl <7.0 (*)    All other components within normal limits  ACETAMINOPHEN LEVEL - Abnormal; Notable for the following components:   Acetaminophen (Tylenol), Serum <10 (*)    All other components within normal limits  RAPID URINE DRUG SCREEN, HOSP PERFORMED - Abnormal; Notable for the following components:   Tetrahydrocannabinol POSITIVE (*)    All other components within normal limits  CBC    EKG None  Radiology No results found.  Procedures Procedures    Medications Ordered in ED Medications - No data to display  ED Course/ Medical Decision Making/ A&P Clinical Course as of 03/28/23 0908  Mon Mar 28, 2023  6045 Patient is getting agitated and anxiously pacing room. Reporting he can't calm down. Requesting anxiety medications and raising voice at staff - thus medication ordered.  [JB]    Clinical Course User Index [JB] Yosef Krogh, Horald Chestnut, PA-C                                 Medical Decision Making Amount and/or Complexity of Data Reviewed Labs: ordered.  Risk OTC drugs. Prescription drug management.   This patient presents to the ED for concern of SI, this involves an extensive number of treatment options, and is a complaint that carries with it a high risk of complications and morbidity.  The differential diagnosis includes Depression, Panic, medication side effect, psychosis, ingestion, anxiety    Co morbidities that complicate the patient evaluation  Depression Panic    Additional history obtained:  Additional history obtained from 03/13/23 [facility]   Lab Tests:  I personally interpreted labs.  The pertinent results include:   CBC without leukocytosis and no anemia.  EtOH is slightly elevated at 14, patient reports he drank 1 pack around 1 AM.  CMP without significant electrolyte abnormality.  Potassium 3.4 this will be orally supplemented.   Salicylate and acetaminophen within normal limits rapid drug screen positive for [facility]   Cardiac Monitoring: / EKG:  The patient was maintained on a cardiac monitor.     Consultations Obtained:  I requested consultation with the [facility],  and discussed lab and imaging findings as well as pertinent plan - they recommend: inpatient stay.    Problem List / ED Course / Critical interventions / Medication management  Reporting with complaint of SI.  Patient has past medical history of suicidal ideation that recently required admission  and short behavioral health stay.  He reports that since he has been discharged she has not been taking his medications regularly and his symptoms have subsequently returned.  Reports severe anxiety and depression that have led to thoughts of SI.  Also reports poor sleep.  On physical exam he is alert oriented not expressing any other complaints.  Vitals are stable.  Reviewed his labs.  He is stable from medical standpoint for behavioral health evaluation. I ordered medication including hydroxyzine  Reevaluation of the patient after these medicines showed that the patient stayed the same I have reviewed the patients home medicines and have made adjustments as needed   Plan  [facility] admission.  Ordered diet and home medications Medically cleared          Final Clinical Impression(s) / ED Diagnoses Final diagnoses:  Suicidal ideation    Rx / DC Orders ED Discharge Orders     None         Smitty Knudsen, PA-C 03/28/23 1205    Alvira Monday, MD 03/28/23 2357

## 2023-03-28 NOTE — [facility] Group Notes (Signed)
 [facility] Group Notes:  (Nursing/MHT/Case Management/Adjunct)  Date:  03/28/2023  Time:  9:02 PM  Type of Therapy:  Psychoeducational Skills  Participation Level:  Did Not Attend  Participation Quality:  Resistant  Affect:  Resistant  Cognitive:  Lacking  Insight:  None  Engagement in Group:  None  Modes of Intervention:  Education  Summary of Progress/Problems: The patient did not attend group this evening.   Michael [name] S 03/28/2023, 9:02 PM

## 2023-03-28 NOTE — ED Notes (Signed)
 Pt report was called to [facility] and safe transport was contacted. EKG being sent with pt.

## 2023-03-28 NOTE — ED Triage Notes (Signed)
 Patient BIB EMS c/o SI. Patient report thoughts on hurting himself without plans. Patient stated increase anxiety after he got Discharge at [facility] few weeks ago. Patient calm and cooperative at this time.

## 2023-03-29 DIAGNOSIS — F332 Major depressive disorder, recurrent severe without psychotic features: Principal | ICD-10-CM

## 2023-03-29 MED ORDER — GABAPENTIN 100 MG PO CAPS
200.0000 mg | ORAL_CAPSULE | Freq: Three times a day (TID) | ORAL | Status: DC
  Administered 2023-03-29 – 2023-04-03 (×17): 200 mg via ORAL
  Filled 2023-03-29 (×3): qty 2
  Filled 2023-03-29 (×2): qty 84
  Filled 2023-03-29 (×4): qty 2
  Filled 2023-03-29: qty 84
  Filled 2023-03-29 (×2): qty 2
  Filled 2023-03-29 (×3): qty 84
  Filled 2023-03-29: qty 2
  Filled 2023-03-29: qty 84
  Filled 2023-03-29: qty 2
  Filled 2023-03-29 (×2): qty 84
  Filled 2023-03-29 (×4): qty 2
  Filled 2023-03-29 (×3): qty 84
  Filled 2023-03-29: qty 2

## 2023-03-29 MED ORDER — BUSPIRONE HCL 10 MG PO TABS
10.0000 mg | ORAL_TABLET | Freq: Three times a day (TID) | ORAL | Status: DC
  Administered 2023-03-29 – 2023-04-03 (×17): 10 mg via ORAL
  Filled 2023-03-29: qty 42
  Filled 2023-03-29: qty 1
  Filled 2023-03-29: qty 42
  Filled 2023-03-29: qty 1
  Filled 2023-03-29: qty 42
  Filled 2023-03-29 (×2): qty 1
  Filled 2023-03-29 (×3): qty 42
  Filled 2023-03-29: qty 1
  Filled 2023-03-29 (×4): qty 42
  Filled 2023-03-29: qty 2
  Filled 2023-03-29 (×2): qty 42
  Filled 2023-03-29 (×5): qty 1
  Filled 2023-03-29: qty 2
  Filled 2023-03-29 (×2): qty 1

## 2023-03-29 MED ORDER — TRAZODONE HCL 50 MG PO TABS
50.0000 mg | ORAL_TABLET | Freq: Every evening | ORAL | Status: DC | PRN
  Administered 2023-03-29 – 2023-04-03 (×6): 50 mg via ORAL
  Filled 2023-03-29 (×3): qty 1
  Filled 2023-03-29: qty 14
  Filled 2023-03-29 (×3): qty 1

## 2023-03-29 MED ORDER — MAGNESIUM HYDROXIDE 400 MG/5ML PO SUSP: 30.0000 mL | Freq: Every day | ORAL | Status: DC | PRN

## 2023-03-29 MED ORDER — QUETIAPINE FUMARATE 25 MG PO TABS: 25.0000 mg | ORAL_TABLET | Freq: Three times a day (TID) | ORAL | Status: DC | PRN

## 2023-03-29 MED ORDER — VENLAFAXINE HCL ER 75 MG PO CP24
75.0000 mg | ORAL_CAPSULE | Freq: Every day | ORAL | Status: DC
  Administered 2023-03-30: 75 mg via ORAL
  Filled 2023-03-29 (×3): qty 1

## 2023-03-29 MED ORDER — ALUM & MAG HYDROXIDE-SIMETH 200-200-20 MG/5ML PO SUSP: 30.0000 mL | ORAL | Status: DC | PRN

## 2023-03-29 MED ORDER — ACETAMINOPHEN 325 MG PO TABS
650.0000 mg | ORAL_TABLET | Freq: Four times a day (QID) | ORAL | Status: DC | PRN
  Administered 2023-04-02: 650 mg via ORAL
  Filled 2023-03-29: qty 2

## 2023-03-29 MED ORDER — QUETIAPINE FUMARATE 50 MG PO TABS
50.0000 mg | ORAL_TABLET | Freq: Every day | ORAL | Status: DC
  Administered 2023-03-29: 50 mg via ORAL
  Filled 2023-03-29 (×3): qty 1

## 2023-03-29 NOTE — Plan of Care (Signed)
  Problem: Safety: Goal: Periods of time without injury will increase Outcome: Progressing   Problem: Coping: Goal: Ability to verbalize frustrations and anger appropriately will improve Outcome: Progressing   Problem: Physical Regulation: Goal: Ability to maintain clinical measurements within normal limits will improve Outcome: Progressing

## 2023-03-29 NOTE — B[facility] Group Notes (Signed)
 [facility] Group Notes:  (Nursing/MHT/Case Management/Adjunct) Adult Psychoeducational Group Note  Date:  03/29/2023 Time:  12:09 PM  Group Topic/Focus:  Goals Group:   The focus of this group is to help patients establish daily goals to achieve during treatment and discuss how the patient can incorporate goal setting into their daily lives to aide in recovery. Orientation:   The focus of this group is to educate the patient on the purpose and policies of crisis stabilization and provide a format to answer questions about their admission.  The group details unit policies and expectations of patients while admitted.  Participation Level:  Did Not Attend    Additional Comments:  Did not attend  Michael [name] 03/29/2023, 12:09 PM

## 2023-03-29 NOTE — BHH Suicide Risk Assessment (Signed)
 [facility] INPATIENT:  Family/Significant Other Suicide Prevention Education  Suicide Prevention Education:  Patient Refusal for Family/Significant Other Suicide Prevention Education: The patient Michael [name] has refused to provide written consent for family/significant other to be provided Family/Significant Other Suicide Prevention Education during admission and/or prior to discharge.  Physician notified.  Pt stated "It's not that I am declining, I just don't have anyone at all for you to call."  Kathi Der 03/29/2023, 4:02 PM
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
## 2023-03-29 NOTE — Progress Notes (Signed)
   03/29/23 0800  Psych Admission Type (Psych Patients Only)  Admission Status Voluntary  Psychosocial Assessment  Patient Complaints Anxiety;Depression  Eye Contact Fair  Facial Expression Anxious  Affect Depressed  Speech Logical/coherent  Interaction Assertive  Motor Activity Fidgety  Appearance/Hygiene Unremarkable  Behavior Characteristics Appropriate to situation  Mood Pleasant  Thought Process  Coherency WDL  Content WDL  Delusions None reported or observed  Perception WDL  Hallucination None reported or observed  Judgment WDL  Confusion None  Danger to Self  Current suicidal ideation? Denies  Agreement Not to Harm Self Yes  Description of Agreement verbal
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
## 2023-03-29 NOTE — [facility] Group Notes (Signed)

## 2023-03-29 NOTE — Group Note (Signed)
 LCSW Group Therapy Note   Group Date: 03/29/2023 Start Time: 1100 End Time: 1200  Participation:  patient was present, actively listened, and was respectful, but didn't participate in the conversation.    Type of Therapy:  Group Therapy  Title: "Healing Flames: Navigating Anger with Compassion"  Objective:  Foster self-awareness and promote compassion toward oneself and others when dealing with anger.  Goals:  Help participants understand the underlying emotions and needs fueling anger. Provide coping strategies for healthier emotional expression and anger management.  Summary: This session explored anger as a volcano--an explosion driven by deeper feelings and unmet needs. Participants learned to identify anger triggers and underlying emotions, then practiced coping strategies like deep breathing, physical activity, and journaling. The group discussed healthy ways to manage anger before it escalates, using both personal reflection and shared experiences.  Therapeutic Modalities: Cognitive Behavioral Therapy (CBT): Challenging thoughts that fuel anger. Mindfulness: Increasing awareness of emotions and sensations.   Alla Feeling, LCSWA 03/29/2023  7:51 PM

## 2023-03-29 NOTE — [facility] Suicide Risk Assessment (Signed)
 Suicide Risk Assessment  Admission Assessment    [facility] Admission Suicide Risk Assessment   Nursing information obtained from:    Demographic factors:  Male, Caucasian, Unemployed, Living alone Current Mental Status:  NA Loss Factors:  Loss of significant relationship Historical Factors:  NA Risk Reduction Factors:  Sense of responsibility to family  Total Time spent with patient: 45 minutes Principal Problem: Major depressive disorder, recurrent, severe without psychotic features (HCC) Diagnosis:  Principal Problem:   Major depressive disorder, recurrent, severe without psychotic features (HCC) Active Problems:   GAD (generalized anxiety disorder)   Panic disorder   Severe benzodiazepine use disorder (HCC)   MDD (major depressive disorder)   Subjective Data:   Michael [name] is a 48 y.o., male with a past psychiatric history significant for MDD, GAD who presents to the [facility] Voluntary from [facility] Emergency Department for evaluation and management of severe anxiety, not able to function, racing thoughts and lack of concentration.    Initial assessment on 2/18, patient was evaluated on the inpatient unit, the patient reports being on Xanax for a long time. He states he came here in October to get off Xanax which he was okay with. However since he has been off Xanax, he feels "not myself. I feel dissociated, unbalanced, my ears are ringing". He states having high anxiety that he is not able to do anything. He states that hydroxyzine and buspar have not been helpful. He feels his mind is racing and started getting worse since October. He states when he was discharged in October, he took the medication regimen until he ran out of the medication, about 30 days. He feel his body is trembling. He notes static vision. He notes prior to hospitalization, he felt frozen which led him to feel he needs more intensive treatment. He states "I never wanted to be dead". Denies SI,  HI. He contracts for safety.    He reports low mood and that started when he stopped taking his psychotropic medications which was in November. He reports anhedonia, poor sleep (difficult falling asleep), feelings of hopelessness, poor concentration, denies psychomotor changes. Regarding Xanax use, he states he started in his 56s until October 2024. He states the last time he had Xanax was in January.  He states during that time, his panic attacks and feelings of dissociation were much worse.  He was recently admitted to the facility based crisis from 2/2 - 2/9 due to worsening anxiety and panic attacks.  From there he was discharged on gabapentin 200 mg 3 times daily, BuSpar 10 mg 3 times daily, Seroquel 100 mg nightly +50 mg every 6 hours as needed for intrusive memories, insomnia, and anxiety, and Effexor 75 mg.  He was intended to follow-up with [facility] but seems to have been lost to follow-up.  Right before he went to the [facility] long ED, he states he was feeling hopeless and did not want to kill himself.  He reported feeling severely anxious, numb, and depressed.  He states that since he was discharged from the [facility] he was not taking his medications because "they do not help".  He states that he was drinking a beer to cope with his anxiety and states that regularly he does not drink alcohol.  I discussed the importance of staying compliant on his medication as most psychotropic medications take multiple weeks to fully take into effect and he seemed understanding.  I also discussed with his chronic benzodiazepine use that after psychiatric  stabilization it would benefit him to going to an inpatient rehabilitation program afterwards and he seemed receptive.  He states that he will think about it and let us know.    Continued Clinical Symptoms:  Alcohol Use Disorder Identification Test Final Score (AUDIT): 0 The "Alcohol Use Disorders Identification Test", Guidelines for Use in Primary Care, Second Edition.   World Science writer [facility]). Score between 0-7:  no or low risk or alcohol related problems. Score between 8-15:  moderate risk of alcohol related problems. Score between 16-19:  high risk of alcohol related problems. Score 20 or above:  warrants further diagnostic evaluation for alcohol dependence and treatment.   CLINICAL FACTORS:   Severe Anxiety and/or Agitation Depression:   Anhedonia Hopelessness Impulsivity Insomnia   Musculoskeletal: Strength & Muscle Tone: within normal limits Gait & Station: normal Patient leans: N/A  Psychiatric Specialty Exam:  Presentation  General Appearance:  Fairly Groomed; Appropriate for Environment     Eye Contact: Fair     Speech: Clear and Coherent; Slow     Speech Volume: Normal     Handedness: Right     Mood and Affect  Mood: Depressed; Hopeless; Anxious     Affect: Depressed; Congruent; Restricted       Thought Process  Thought Processes: Coherent; Linear; Goal Directed     Duration of Psychotic Symptoms: No data recorded Past Diagnosis of Schizophrenia or Psychoactive disorder:  No   Descriptions of Associations: Intact     Orientation: Full (Time, Place and Person)     Thought Content: Logical     Hallucinations:Hallucinations: None     Ideas of Reference: None     Suicidal Thoughts:Suicidal Thoughts: No     Homicidal Thoughts:Homicidal Thoughts: No       Sensorium  Memory: Remote Good     Judgment: Impaired     Insight: Fair; Lacking       Executive Functions  Concentration: Fair     Attention Span: Fair     Recall: [employer] of Knowledge: Fair     Language: Fair       Psychomotor Activity  Psychomotor Activity:Psychomotor Activity: Normal       Assets  Assets: Resilience; Desire for Improvement       Sleep  Sleep:Sleep: Fair     Physical Exam Vitals reviewed.  Constitutional:      Appearance: Normal appearance.  HENT:      Head: Normocephalic and atraumatic.  Cardiovascular:     Rate and Rhythm: Normal rate.  Pulmonary:     Effort: Pulmonary effort is normal.  Neurological:     Mental Status: He is alert and oriented to person, place, and time.      Review of Systems  Constitutional:  Negative for chills and fever.  Respiratory:  Negative for shortness of breath.   Cardiovascular:  Negative for chest pain and palpitations.  Gastrointestinal:  Negative for nausea and vomiting.  Neurological:  Negative for headaches.      COGNITIVE FEATURES THAT CONTRIBUTE TO RISK:  Closed-mindedness    SUICIDE RISK:  Moderate:  Frequent suicidal ideation with limited intensity, and duration, some specificity in terms of plans, no associated intent, good self-control, limited dysphoria/symptomatology, some risk factors present, and identifiable protective factors, including available and accessible social support.  PLAN OF CARE: See H&P for assessment and plan.   I certify that inpatient services furnished can reasonably be expected to improve the patient's condition.   Barnett Hatter  Oda Cogan, MD 03/29/2023, 11:51 AM

## 2023-03-29 NOTE — Progress Notes (Signed)
   03/28/23 2132  Psych Admission Type (Psych Patients Only)  Admission Status Voluntary  Psychosocial Assessment  Patient Complaints Depression;Anxiety  Eye Contact Fair  Facial Expression Anxious  Affect Depressed  Speech Logical/coherent  Interaction Assertive  Motor Activity Fidgety  Appearance/Hygiene Unremarkable  Behavior Characteristics Appropriate to situation  Mood Pleasant  Thought Process  Coherency WDL  Content WDL  Delusions None reported or observed  Perception WDL  Hallucination None reported or observed  Judgment WDL  Confusion None  Danger to Self  Current suicidal ideation? Denies  Agreement Not to Harm Self Yes  Description of Agreement verbal

## 2023-03-29 NOTE — [facility] Group Notes (Signed)
 [facility] Group Notes:  (Nursing/MHT/Case Management/Adjunct)  Date:  03/29/2023  Time:  8:57 PM  Type of Therapy:  Psychoeducational Skills  Participation Level:  Did Not Attend  Participation Quality:  Resistant  Affect:  Resistant  Cognitive:  Lacking  Insight:  None  Engagement in Group:  None  Modes of Intervention:  Education  Summary of Progress/Problems: The patient did not attend group this evening.   Michael [name] 03/29/2023, 8:57 PM

## 2023-03-30 ENCOUNTER — Encounter (HOSPITAL_COMMUNITY): Payer: Self-pay

## 2023-03-30 LAB — LIPID PANEL
Cholesterol: 254 mg/dL — ABNORMAL HIGH (ref 0–200)
HDL: 65 mg/dL (ref >40)
LDL Cholesterol: 160 mg/dL — ABNORMAL HIGH (ref 0–99)
Total CHOL/HDL Ratio: 3.9 {ratio}
Triglycerides: 144 mg/dL (ref <150)
VLDL: 29 mg/dL (ref 0–40)

## 2023-03-30 LAB — HEMOGLOBIN A1C
Hgb A1c MFr Bld: 5.2 % (ref 4.8–5.6)
Mean Plasma Glucose: 102.54 mg/dL

## 2023-03-30 LAB — TSH: TSH: 2.019 u[IU]/mL (ref 0.350–4.500)

## 2023-03-30 MED ORDER — VENLAFAXINE HCL ER 37.5 MG PO CP24
112.5000 mg | ORAL_CAPSULE | Freq: Every day | ORAL | Status: DC
  Administered 2023-03-31: 112.5 mg via ORAL
  Filled 2023-03-30 (×2): qty 3

## 2023-03-30 MED ORDER — QUETIAPINE FUMARATE 50 MG PO TABS
50.0000 mg | ORAL_TABLET | Freq: Every day | ORAL | Status: DC
Start: 2023-03-30 — End: 2023-03-31
  Administered 2023-03-30: 50 mg via ORAL
  Filled 2023-03-30 (×3): qty 1

## 2023-03-30 MED ORDER — QUETIAPINE FUMARATE 50 MG PO TABS
50.0000 mg | ORAL_TABLET | Freq: Two times a day (BID) | ORAL | Status: DC
  Administered 2023-03-30 – 2023-03-31 (×3): 50 mg via ORAL
  Filled 2023-03-30 (×6): qty 1

## 2023-03-30 NOTE — Progress Notes (Signed)
   03/30/23 1100  Psych Admission Type (Psych Patients Only)  Admission Status Voluntary  Psychosocial Assessment  Patient Complaints Depression  Eye Contact Fair  Facial Expression Anxious  Affect Depressed  Speech Logical/coherent  Interaction Assertive  Motor Activity Slow  Appearance/Hygiene Unremarkable  Behavior Characteristics Appropriate to situation  Mood Pleasant  Thought Process  Coherency WDL  Content WDL  Delusions None reported or observed  Perception WDL  Hallucination None reported or observed  Judgment WDL  Confusion None  Danger to Self  Current suicidal ideation? Denies  Danger to Others  Danger to Others None reported or observed   DAR NOTE: Patient presents with anxious affect and depressed mood.  Denies suicidal thoughts, pain, auditory and visual hallucinations.  Rates depression at 4, hopelessness at 7, and anxiety at 4.  Maintained on routine safety checks.  Medications given as prescribed.  Support and encouragement offered as needed.  Attended group and participated.  States goal for today is "making phone calls to get the ball rolling."  Patient visible in milieu with minimal interaction.  Offered no complaint.

## 2023-03-30 NOTE — [facility] Group Notes (Signed)
Spiritual care group facilitated by Chaplain Dyanne Carrel, [facility]  Group focused on topic of strength. Group members reflected on what thoughts and feelings emerge when they hear this topic. They then engaged in facilitated dialog around how strength is present in their lives. This dialog focused on representing what strength had been to them in their lives (images and patterns given) and what they saw as helpful in their life now (what they needed / wanted).  Activity drew on narrative framework.  Patient Progress: Michael [name] attended group and actively engaged and participated in group conversation and activities.

## 2023-03-30 NOTE — Plan of Care (Signed)
   Problem: Education: Goal: Emotional status will improve Outcome: Not Progressing Goal: Mental status will improve Outcome: Not Progressing

## 2023-03-30 NOTE — BH IP Treatment Plan (Signed)
 Interdisciplinary Treatment and Diagnostic Plan Update  03/30/2023 Time of Session: 1045AM Michael [name] MRN: [id number]  Principal Diagnosis: Major depressive disorder, recurrent, severe without psychotic features (HCC)  Secondary Diagnoses: Principal Problem:   Major depressive disorder, recurrent, severe without psychotic features (HCC) Active Problems:   GAD (generalized anxiety disorder)   Panic disorder   Severe benzodiazepine use disorder (HCC)   MDD (major depressive disorder)   Current Medications:  Current Facility-Administered Medications  Medication Dose Route Frequency Provider Last Rate Last Admin   acetaminophen (TYLENOL) tablet 650 mg  650 mg Oral Q6H PRN Lance Muss, MD       alum & mag hydroxide-simeth (MAALOX/MYLANTA) 200-200-20 MG/5ML suspension 30 mL  30 mL Oral Q4H PRN Lance Muss, MD       busPIRone (BUSPAR) tablet 10 mg  10 mg Oral TID Kizzie Ide B, MD   10 mg at 03/30/23 1153   haloperidol lactate (HALDOL) injection 5 mg  5 mg Intramuscular TID PRN Starleen Blue, NP       And   diphenhydrAMINE (BENADRYL) injection 50 mg  50 mg Intramuscular TID PRN Starleen Blue, NP       And   LORazepam (ATIVAN) injection 2 mg  2 mg Intramuscular TID PRN Starleen Blue, NP       haloperidol lactate (HALDOL) injection 10 mg  10 mg Intramuscular TID PRN Starleen Blue, NP       And   diphenhydrAMINE (BENADRYL) injection 50 mg  50 mg Intramuscular TID PRN Starleen Blue, NP       And   LORazepam (ATIVAN) injection 2 mg  2 mg Intramuscular TID PRN Starleen Blue, NP       gabapentin (NEURONTIN) capsule 200 mg  200 mg Oral TID Kizzie Ide B, MD   200 mg at 03/30/23 1153   hydrOXYzine (ATARAX) tablet 25 mg  25 mg Oral TID PRN Starleen Blue, NP   25 mg at 03/29/23 0805   magnesium hydroxide (MILK OF MAGNESIA) suspension 30 mL  30 mL Oral Daily PRN Lance Muss, MD       nicotine (NICODERM CQ - dosed in mg/24 hours) patch 14 mg  14 mg Transdermal Daily  Massengill, Harrold Donath, MD   14 mg at 03/30/23 0804   OLANZapine zydis (ZYPREXA) disintegrating tablet 5 mg  5 mg Oral TID PRN Starleen Blue, NP   5 mg at 03/30/23 1191   QUEtiapine (SEROQUEL) tablet 50 mg  50 mg Oral QHS Kizzie Ide B, MD       And   QUEtiapine (SEROQUEL) tablet 50 mg  50 mg Oral BID Kizzie Ide B, MD       traZODone (DESYREL) tablet 50 mg  50 mg Oral QHS PRN Kizzie Ide B, MD   50 mg at 03/29/23 2121   [START ON 03/31/2023] venlafaxine XR (EFFEXOR-XR) 24 hr capsule 112.5 mg  112.5 mg Oral Q breakfast Lance Muss, MD       PTA Medications: Medications Prior to Admission  Medication Sig Dispense Refill Last Dose/Taking   busPIRone (BUSPAR) 10 MG tablet Take 1 tablet (10 mg total) by mouth 3 (three) times daily. (Patient not taking: Reported on 03/28/2023) 90 tablet 0    gabapentin (NEURONTIN) 100 MG capsule Take 2 capsules (200 mg total) by mouth 3 (three) times daily. (Patient not taking: Reported on 03/28/2023) 180 capsule 0    nicotine (NICODERM CQ - DOSED IN MG/24 HOURS) 21 mg/24hr patch Place 1 patch (21  mg total) onto the skin daily. (Patient not taking: Reported on 03/28/2023) 28 patch 0    QUEtiapine (SEROQUEL) 100 MG tablet Take 1 tablet (100 mg total) by mouth at bedtime. May also take 0.5 tablets (50 mg total) 3 (three) times daily as needed (severe anxiety). (Patient not taking: Reported on 03/28/2023) 60 tablet 0    venlafaxine XR (EFFEXOR-XR) 75 MG 24 hr capsule Take 1 capsule (75 mg total) by mouth daily with breakfast. (Patient not taking: Reported on 03/28/2023) 30 capsule 0     Patient Stressors: Financial difficulties   Substance abuse    Patient Strengths: Motivation for treatment/growth   Treatment Modalities: Medication Management, Group therapy, Case management,  1 to 1 session with clinician, Psychoeducation, Recreational therapy.   Physician Treatment Plan for Primary Diagnosis: Major depressive disorder, recurrent, severe without psychotic  features (HCC) Long Term Goal(s):     Short Term Goals: Ability to identify changes in lifestyle to reduce recurrence of condition will improve Ability to verbalize feelings will improve Ability to disclose and discuss suicidal ideas Ability to demonstrate self-control will improve Ability to identify and develop effective coping behaviors will improve Ability to maintain clinical measurements within normal limits will improve Compliance with prescribed medications will improve Ability to identify triggers associated with substance abuse/mental health issues will improve  Medication Management: Evaluate patient's response, side effects, and tolerance of medication regimen.  Therapeutic Interventions: 1 to 1 sessions, Unit Group sessions and Medication administration.  Evaluation of Outcomes: Not Progressing  Physician Treatment Plan for Secondary Diagnosis: Principal Problem:   Major depressive disorder, recurrent, severe without psychotic features (HCC) Active Problems:   GAD (generalized anxiety disorder)   Panic disorder   Severe benzodiazepine use disorder (HCC)   MDD (major depressive disorder)  Long Term Goal(s):     Short Term Goals: Ability to identify changes in lifestyle to reduce recurrence of condition will improve Ability to verbalize feelings will improve Ability to disclose and discuss suicidal ideas Ability to demonstrate self-control will improve Ability to identify and develop effective coping behaviors will improve Ability to maintain clinical measurements within normal limits will improve Compliance with prescribed medications will improve Ability to identify triggers associated with substance abuse/mental health issues will improve     Medication Management: Evaluate patient's response, side effects, and tolerance of medication regimen.  Therapeutic Interventions: 1 to 1 sessions, Unit Group sessions and Medication administration.  Evaluation of Outcomes:  Not Progressing   RN Treatment Plan for Primary Diagnosis: Major depressive disorder, recurrent, severe without psychotic features (HCC) Long Term Goal(s): Knowledge of disease and therapeutic regimen to maintain health will improve  Short Term Goals: Ability to remain free from injury will improve, Ability to verbalize frustration and anger appropriately will improve, Ability to demonstrate self-control, Ability to participate in decision making will improve, Ability to verbalize feelings will improve, Ability to disclose and discuss suicidal ideas, Ability to identify and develop effective coping behaviors will improve, and Compliance with prescribed medications will improve  Medication Management: RN will administer medications as ordered by provider, will assess and evaluate patient's response and provide education to patient for prescribed medication. RN will report any adverse and/or side effects to prescribing provider.  Therapeutic Interventions: 1 on 1 counseling sessions, Psychoeducation, Medication administration, Evaluate responses to treatment, Monitor vital signs and CBGs as ordered, Perform/monitor CIWA, COWS, AIMS and Fall Risk screenings as ordered, Perform wound care treatments as ordered.  Evaluation of Outcomes: Not Progressing   LCSW Treatment Plan  for Primary Diagnosis: Major depressive disorder, recurrent, severe without psychotic features (HCC) Long Term Goal(s): Safe transition to appropriate next level of care at discharge, Engage patient in therapeutic group addressing interpersonal concerns.  Short Term Goals: Engage patient in aftercare planning with referrals and resources, Increase social support, Increase ability to appropriately verbalize feelings, Increase emotional regulation, Facilitate acceptance of mental health diagnosis and concerns, Facilitate patient progression through stages of change regarding substance use diagnoses and concerns, Identify triggers  associated with mental health/substance abuse issues, and Increase skills for wellness and recovery  Therapeutic Interventions: Assess for all discharge needs, 1 to 1 time with Social worker, Explore available resources and support systems, Assess for adequacy in community support network, Educate family and significant other(s) on suicide prevention, Complete Psychosocial Assessment, Interpersonal group therapy.  Evaluation of Outcomes: Not Progressing   Progress in Treatment: Attending groups: No. Participating in groups: No. Taking medication as prescribed: Yes. Toleration medication: Yes. Family/Significant other contact made: No, will contact:  pt declined consents Patient understands diagnosis: Yes. Discussing patient identified problems/goals with staff: Yes. Medical problems stabilized or resolved: Yes. Denies suicidal/homicidal ideation: Yes. Issues/concerns per patient self-inventory: No.  New problem(s) identified: No, Describe:  none reported  New Short Term/Long Term Goal(s): medication stabilization, elimination of SI thoughts, development of comprehensive mental wellness plan.    Patient Goals:  "Feel like I am more present, adjust my medication, and go to substance use treatment"  Discharge Plan or Barriers: Patient recently admitted. CSW will continue to follow and assess for appropriate referrals and possible discharge planning.    Reason for Continuation of Hospitalization: Anxiety Depression Medication stabilization Suicidal ideation  Estimated Length of Stay: 5-7 days  Last 3 [place] Suicide Severity Risk Score: Flowsheet Row [facility]Current[facility] Most recent reading at 03/28/2023  8:00 AM ED from 03/28/2023 in [facility] at [facility] Most recent reading at 03/28/2023  5:29 AM ED from 03/13/2023 in [facility] Most recent reading at  03/13/2023 12:27 PM  C-SSRS RISK CATEGORY Low Risk Moderate Risk Low Risk       Last PHQ 2/9 Scores:    03/20/2023    9:16 AM 03/13/2023   12:27 PM  Depression screen PHQ 2/9  Decreased Interest 2 2  Down, Depressed, Hopeless 2 2  PHQ - 2 Score 4 4  Altered sleeping  1  Tired, decreased energy  1  Change in appetite  1  Feeling bad or failure about yourself   2  Trouble concentrating  1  Moving slowly or fidgety/restless  2  Suicidal thoughts  1  PHQ-9 Score  13  Difficult doing work/chores  Very difficult    Scribe for Treatment Team: Kathi Der, LCSWA 03/30/2023 1:56 PM
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
## 2023-03-30 NOTE — Group Note (Signed)
 Date:  03/30/2023 Time:  9:07 AM  Group Topic/Focus:  Developing a Wellness Toolbox:   The focus of this group is to help patients develop a "wellness toolbox" with skills and strategies to promote recovery upon discharge.    Participation Level:  Did Not Attend   Erasmo Score 03/30/2023, 9:07 AM

## 2023-03-30 NOTE — Progress Notes (Signed)
   03/30/23 0535  Psych Admission Type (Psych Patients Only)  Admission Status Voluntary  Psychosocial Assessment  Patient Complaints Depression;Anxiety  Eye Contact Fair  Facial Expression Anxious  Affect Depressed  Speech Logical/coherent  Interaction Assertive  Motor Activity Fidgety  Appearance/Hygiene Unremarkable  Behavior Characteristics Appropriate to situation  Mood Pleasant  Thought Process  Coherency WDL  Content WDL  Delusions None reported or observed  Perception WDL  Hallucination None reported or observed  Judgment WDL  Confusion None  Danger to Self  Current suicidal ideation? Denies  Agreement Not to Harm Self Yes  Description of Agreement verbal  Danger to Others  Danger to Others None reported or observed

## 2023-03-31 DIAGNOSIS — F332 Major depressive disorder, recurrent severe without psychotic features: Secondary | ICD-10-CM | POA: Diagnosis not present

## 2023-03-31 MED ORDER — VENLAFAXINE HCL ER 150 MG PO CP24
150.0000 mg | ORAL_CAPSULE | Freq: Every day | ORAL | Status: DC
  Administered 2023-04-01 – 2023-04-03 (×3): 150 mg via ORAL
  Filled 2023-03-31: qty 1
  Filled 2023-03-31 (×2): qty 14
  Filled 2023-03-31: qty 1
  Filled 2023-03-31 (×2): qty 14

## 2023-03-31 MED ORDER — QUETIAPINE FUMARATE 50 MG PO TABS
50.0000 mg | ORAL_TABLET | ORAL | Status: DC
  Administered 2023-03-31 – 2023-04-03 (×11): 50 mg via ORAL
  Filled 2023-03-31 (×6): qty 42
  Filled 2023-03-31: qty 1
  Filled 2023-03-31 (×9): qty 42
  Filled 2023-03-31: qty 1

## 2023-03-31 NOTE — Progress Notes (Signed)
 D: Pt alert and oriented. Pt rates depression 0/10 and anxiety 5/10.  Pt denies experiencing any SI/HI, or AVH at this time.   A: Scheduled medications administered to pt, per MD orders. PRN hydroxyzine administered. Support and encouragement provided. Frequent verbal contact made. Routine safety checks conducted q15 minutes.   R: No adverse drug reactions noted. Pt verbally contracts for safety at this time. Pt complaint with medications and treatment plan. Pt interacts well with others on the unit. Pt remains safe at this time. Will continue to monitor.

## 2023-03-31 NOTE — Progress Notes (Signed)
 [facility] Resource:   Contact information was provided to pt to complete phone screening for [facility]. Pt was informed to update CSW regarding completion to f/u.   Rapheal Masso LCSW 03/31/2023 @0913 

## 2023-03-31 NOTE — Plan of Care (Signed)
  Problem: Education: Goal: Emotional status will improve Outcome: Progressing   Problem: Activity: Goal: Interest or engagement in activities will improve Outcome: Progressing   Problem: Coping: Goal: Ability to demonstrate self-control will improve Outcome: Progressing   Problem: Health Behavior/Discharge Planning: Goal: Compliance with treatment plan for underlying cause of condition will improve Outcome: Progressing   Problem: Safety: Goal: Periods of time without injury will increase Outcome: Progressing

## 2023-03-31 NOTE — Progress Notes (Signed)
     03/31/2023       8:10 AM   Kriston R Sedgwick   Type of Note: Substance use referrals  [facility]: Faxed 03/31/23  [facility]: Faxed 03/30/23. Pt given number to complete phone screen.  Update: Pt called & [facility] reports they do not accept his BCBS health insurance plan.    Signed:  Marlinda Miranda, LCSW-A 03/31/2023  8:10 AM

## 2023-03-31 NOTE — Group Note (Signed)
 Date:  03/31/2023 Time:  4:19 PM  Group Topic/Focus:  Goals Group:   The focus of this group is to help patients establish daily goals to achieve during treatment and discuss how the patient can incorporate goal setting into their daily lives to aide in recovery. Orientation:   The focus of this group is to educate the patient on the purpose and policies of crisis stabilization and provide a format to answer questions about their admission.  The group details unit policies and expectations of patients while admitted.    Participation Level:  Active  Participation Quality:  Appropriate  Affect:  Appropriate  Cognitive:  Appropriate  Insight: Appropriate  Engagement in Group:  Engaged  Modes of Intervention:  Orientation  Additional Comments:    Michael [name] D Venson Ferencz 03/31/2023, 4:19 PM

## 2023-03-31 NOTE — Group Note (Signed)
 Date:  03/31/2023 Time:  4:26 PM  Group Topic/Focus:  Self Care:   The focus of this group is to help patients understand the importance of self-care in order to improve or restore emotional, physical, spiritual, interpersonal, and financial health.    Participation Level:  Minimal  Participation Quality:  Appropriate  Affect:  Appropriate  Cognitive:  Appropriate  Insight: Appropriate  Engagement in Group:  Limited  Modes of Intervention:  Discussion and Education  Additional Comments:    Michael [name] 03/31/2023, 4:26 PM
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
## 2023-03-31 NOTE — Progress Notes (Signed)
   03/31/23 0800  Psych Admission Type (Psych Patients Only)  Admission Status Voluntary  Psychosocial Assessment  Patient Complaints Anxiety;Depression  Eye Contact Fair  Facial Expression Anxious;Sad  Affect Anxious;Depressed  Speech Logical/coherent  Interaction Assertive  Motor Activity Slow  Appearance/Hygiene Unremarkable  Behavior Characteristics Appropriate to situation  Mood Pleasant  Thought Process  Coherency WDL  Content WDL  Delusions None reported or observed  Perception WDL  Hallucination None reported or observed  Judgment WDL  Confusion None  Danger to Self  Current suicidal ideation? Denies  Agreement Not to Harm Self Yes  Description of Agreement verbal  Danger to Others  Danger to Others None reported or observed

## 2023-03-31 NOTE — Progress Notes (Deleted)
 Housing Resources:   [facility] vacancies:   CSW provided a Producer, television/film/video for the [place] locations. CSW informed pt to call and conduct interview screening required for admission.    [facility]  [place] [phone] Interviews Daily 6:00pm  [facility] number: [phone]   [place] [phone]  Interviews Tue 5:30pm   [place]  [phone]  Interviews Daily 9:30pm   [place]  [phone]      Interviews Daily 7:00pm  Joya Gaskins  [place] [phone] Caryn Bee [phone] Interviews Wynelle Link 1:00pm  [place] [phone]  [place] [phone]  Interviews Wynelle Link 5:00pm  Debbora Lacrosse [place]  [phone] [place] [phone]  Interviews Wynelle Link 5:00pm   [place] [phone]  Dorene Sorrow [phone]  Interviews Mon 6:00pm   [place] [phone] Marita [place] [phone]  Interviews Daily 7:00pm  Charna Elizabeth  [phone]  Trey Paula [phone]  InterviewsSun 9:00pm  Steffanie Dunn LCSWA

## 2023-03-31 NOTE — Group Note (Signed)
 LCSW Group Therapy Note   Group Date: 03/31/2023 Start Time: 1100 End Time: 1200  Participation:  patient arrived 20 minutes before the class ended.  He actively listened and somewhat participated in the conversation.    Type of Therapy:  Group Therapy   Topic:  Finding Balance: Using Wise Mind for Thoughtful Decisions  Objective: This class aims to help participants understand the concept of [place] Mind and apply it in real-life situations for more balanced, thoughtful decision-making. Participants will gain tools to manage emotions, consider logic, and find a middle ground for healthier responses and outcomes.  Goals: 1.  Understand Wise Mind: Learn the difference between Emotional Mind, Reasonable Mind, and [place] Mind, and how [place] Mind helps balance emotions and logic for thoughtful decisions. 2.  Recognize Emotional and Reasonable Mind: Identify signs of Emotional Mind and Reasonable Mind in personal reactions and how to shift into Wise Mind for balanced responses. 3.  Practice Wise Mind: Engage in scenarios and activities to apply [employer], combining emotions and logic for balanced, thoughtful decisions.  Summary In this class, we explored Wise Mind as the balance between Emotional Mind (impulsive reactions) and Reasonable Mind (logic-focused responses). We discussed how to identify when we're in each state and practiced using Wise Mind to make more balanced, thoughtful decisions in everyday situations, improving decision-making and relationships.  Therapeutic Modalities This class incorporates Dialectical Behavior Therapy (DBT), mindfulness techniques, and emotion regulation strategies to help participants balance emotional and logical responses in their decision-making process.   Alla Feeling, LCSWA 03/31/2023  7:57 PM

## 2023-03-31 NOTE — Group Note (Signed)
 Date:  03/31/2023 Time:  10:21 PM  Group Topic/Focus:  Wrap-Up Group:   The focus of this group is to help patients review their daily goal of treatment and discuss progress on daily workbooks.    Participation Level:  Active  Participation Quality:  Appropriate and Attentive  Affect:  Appropriate  Cognitive:  Appropriate  Insight: Appropriate and Good  Engagement in Group:  Engaged  Modes of Intervention:  Discussion  Additional Comments:  Patient stated that he had a good day.  Patient rated his day a 7 out of 10.  Patient just stated that he is happy to be here.  Alfonse Ras 03/31/2023, 10:21 PM

## 2023-03-31 NOTE — Progress Notes (Signed)
     03/31/2023       12:58 PM   Michael [name]   Type of Note: [facility]   Spoke with Marcelino Duster at [facility], pt was accepted to their facility and is due to admit there on Monday 04/04/23 by 9:00AM. Pt needs to arrive with 14 day supply of medications and 30 day scripts. MD and patient aware and agreeable.   Weekend CSW team to complete discharge Sunday evening and arrange taxi for Monday at 8:00AM.   Signed:  Maricia Scotti, LCSW-A 03/31/2023  12:58 PM

## 2023-04-01 DIAGNOSIS — F332 Major depressive disorder, recurrent severe without psychotic features: Secondary | ICD-10-CM | POA: Diagnosis not present

## 2023-04-01 NOTE — Plan of Care (Signed)
   Problem: Education: Goal: Emotional status will improve Outcome: Progressing Goal: Mental status will improve Outcome: Progressing Goal: Verbalization of understanding the information provided will improve Outcome: Progressing   Problem: Activity: Goal: Interest or engagement in activities will improve Outcome: Progressing

## 2023-04-01 NOTE — Progress Notes (Signed)
   04/01/23 0800  Psych Admission Type (Psych Patients Only)  Admission Status Voluntary  Psychosocial Assessment  Patient Complaints Anxiety;Depression  Eye Contact Fair  Facial Expression Anxious;Animated  Affect Anxious;Depressed  Speech Logical/coherent  Interaction Assertive  Motor Activity Other (Comment) (WNL)  Appearance/Hygiene Unremarkable  Behavior Characteristics Cooperative;Appropriate to situation  Mood Pleasant  Thought Process  Coherency WDL  Content WDL  Delusions None reported or observed  Perception WDL  Hallucination None reported or observed  Judgment WDL  Confusion None  Danger to Self  Current suicidal ideation? Denies  Agreement Not to Harm Self Yes  Description of Agreement verbal  Danger to Others  Danger to Others None reported or observed

## 2023-04-01 NOTE — Group Note (Signed)
 Date:  04/01/2023 Time:  9:38 PM  Group Topic/Focus:  Alcoholics Anonymous (AA) Meeting    Participation Level:  Active  Participation Quality:  Appropriate  Affect:  Appropriate  Cognitive:  Appropriate  Insight: Appropriate  Engagement in Group:  Engaged  Modes of Intervention:  Socialization and Support  Additional Comments:  Patient attended Alcoholics Anonymous (AA) Meeting  Michael [name] 04/01/2023, 9:38 PM

## 2023-04-01 NOTE — Plan of Care (Signed)
 Pt denies SI/HI/AV. Pt is pleasant and cooperative. Pt was offered support and encouragement. Pt was given scheduled medications. Q 15 minute safety checks performed/ongoing. Pt attends groups and interacts well with peers and staff. Pt compliant w/med and plan of care. Pt has no complaints. Pt receptive to treatment and safety maintained on unit.  Problem: Education: Goal: Knowledge of [place] [facility] Education information/materials will improve Outcome: Progressing Goal: Emotional status will improve Outcome: Progressing Goal: Mental status will improve Outcome: Progressing Goal: Verbalization of understanding the information provided will improve Outcome: Progressing   Problem: Activity: Goal: Interest or engagement in activities will improve Outcome: Progressing Goal: Sleeping patterns will improve Outcome: Progressing   Problem: Coping: Goal: Ability to verbalize frustrations and anger appropriately will improve Outcome: Progressing Goal: Ability to demonstrate self-control will improve Outcome: Progressing   Problem: Health Behavior/Discharge Planning: Goal: Identification of resources available to assist in meeting health care needs will improve Outcome: Progressing Goal: Compliance with treatment plan for underlying cause of condition will improve Outcome: Progressing   Problem: Physical Regulation: Goal: Ability to maintain clinical measurements within normal limits will improve Outcome: Progressing   Problem: Safety: Goal: Periods of time without injury will increase Outcome: Progressing
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
## 2023-04-01 NOTE — Progress Notes (Signed)
   03/31/23 2142  Psych Admission Type (Psych Patients Only)  Admission Status Voluntary  Psychosocial Assessment  Patient Complaints Anxiety;Depression  Eye Contact Fair  Facial Expression Anxious;Sad  Affect Anxious;Depressed  Speech Logical/coherent  Interaction Assertive  Motor Activity Slow  Appearance/Hygiene Unremarkable  Behavior Characteristics Appropriate to situation  Mood Pleasant  Aggressive Behavior  Effect No apparent injury  Thought Process  Coherency WDL  Content WDL  Delusions None reported or observed  Perception WDL  Hallucination None reported or observed  Judgment WDL  Confusion None  Danger to Self  Current suicidal ideation? Denies  Agreement Not to Harm Self Yes  Description of Agreement Verbal  Danger to Others  Danger to Others None reported or observed

## 2023-04-01 NOTE — Group Note (Signed)
 Recreation Therapy Group Note   Group Topic:Team Building  Group Date: 04/01/2023 Start Time: 0930 End Time: 0956 Facilitators: Caylah Plouff-McCall, LRT,CTRS Location: [address] Dayroom   Group Topic: Communication, Team Building, Problem Solving  Goal Area(s) Addresses:  Patient will effectively work with peer towards shared goal.  Patient will identify skills used to make activity successful.  Patient will identify how skills used during activity can be used to reach post d/c goals.   Intervention: STEM Activity  Activity: Stage manager. In teams of 3-5, patients were given 12 plastic drinking straws and an equal length of masking tape. Using the materials provided, patients were asked to build a landing pad to catch a golf ball dropped from approximately 5 feet in the air. All materials were required to be used by the team in their design. LRT facilitated post-activity discussion.  Education: Pharmacist, community, Scientist, physiological, Discharge Planning   Education Outcome: Acknowledges education/In group clarification offered/Needs additional education.    Affect/Mood: N/A   Participation Level: Did not attend    Clinical Observations/Individualized Feedback:      Plan: Continue to engage patient in RT group sessions 2-3x/week.   Jatara Huettner-McCall, LRT,CTRS 04/01/2023 12:22 PM

## 2023-04-01 NOTE — Progress Notes (Signed)
   04/01/23 4098  15 Minute Checks  Location Dayroom  Visual Appearance Calm  Behavior Composed  Sleep (Behavioral Health Patients Only)  Calculate sleep? (Click Yes once per 24 hr at 0600 safety check) Yes  Documented sleep last 24 hours 8

## 2023-04-02 DIAGNOSIS — F332 Major depressive disorder, recurrent severe without psychotic features: Secondary | ICD-10-CM | POA: Diagnosis not present

## 2023-04-02 NOTE — Plan of Care (Signed)
  Problem: Activity: Goal: Interest or engagement in activities will improve Outcome: Progressing Goal: Sleeping patterns will improve Outcome: Progressing   Problem: Coping: Goal: Ability to verbalize frustrations and anger appropriately will improve Outcome: Progressing Goal: Ability to demonstrate self-control will improve Outcome: Progressing   Problem: Physical Regulation: Goal: Ability to maintain clinical measurements within normal limits will improve Outcome: Progressing   Problem: Safety: Goal: Periods of time without injury will increase Outcome: Progressing
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
## 2023-04-02 NOTE — Progress Notes (Signed)
 Patient rated his anxiety level 3/10 and his depression level 3/10 with 10 being the highest and 0 none. Patient identified his goal for today as, " Continue going to groups". Medication and group compliant. Pt observed interacting well with some peers. Appetite good on shift. Safety maintained.  04/02/23 0920  Psych Admission Type (Psych Patients Only)  Admission Status Voluntary  Psychosocial Assessment  Patient Complaints Anxiety;Depression  Eye Contact Fair  Facial Expression Animated  Affect Appropriate to circumstance  Speech Logical/coherent  Interaction Assertive  Motor Activity Other (Comment) (WNL)  Appearance/Hygiene Unremarkable  Behavior Characteristics Cooperative  Mood Pleasant  Thought Process  Coherency WDL  Content WDL  Delusions None reported or observed  Perception WDL  Hallucination None reported or observed  Judgment WDL  Confusion None  Danger to Self  Current suicidal ideation? Denies  Agreement Not to Harm Self Yes  Description of Agreement Verbal  Danger to Others  Danger to Others None reported or observed
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
## 2023-04-02 NOTE — Group Note (Signed)
 LCSW Group Therapy Note   Group Date: 04/02/2023 Start Time: 1000 End Time: 1100   Type of Therapy and Topic:  Group Therapy:   Participation Level:  Did Not Attend   Corie Chiquito, LCSWA 04/02/2023  12:39 PM

## 2023-04-02 NOTE — Progress Notes (Signed)
 Adult Psychoeducational Group Note  Date:  04/02/2023 Time:  1:19 PM  Group Topic/Focus:  Goals Group:   The focus of this group is to help patients establish daily goals to achieve during treatment and discuss how the patient can incorporate goal setting into their daily lives to aide in recovery.  Participation Level:  Active  Participation Quality:  Appropriate  Affect:  Appropriate  Cognitive:  Appropriate  Insight: Appropriate  Engagement in Group:  Engaged  Modes of Intervention:  Discussion  Additional Comments:  Pt stated his goal for the day was to attend all groups.  Wynema Birch D 04/02/2023, 1:19 PM

## 2023-04-02 NOTE — Progress Notes (Signed)
 Pt did attend meditation group.

## 2023-04-02 NOTE — Group Note (Signed)
 Date:  04/02/2023 Time:  3:22 PM  Group Topic/Focus:  Self Esteem:   Neomia Dear can help build self-esteem and confidence through singing, social engagement, and positive reinforcement. It can also help people express themselves and release tension   Participation Level:  Active  Participation Quality:  Appropriate and Attentive  Affect:  Appropriate  Cognitive:  Alert and Appropriate  Insight: Appropriate  Engagement in Group:  Engaged  Modes of Intervention:  Activity, Exploration, Rapport Building, Socialization, and Support  Additional Comments:    Shela Nevin 04/02/2023, 3:22 PM

## 2023-04-03 DIAGNOSIS — F332 Major depressive disorder, recurrent severe without psychotic features: Secondary | ICD-10-CM | POA: Diagnosis not present

## 2023-04-03 MED ORDER — HYDROXYZINE HCL 25 MG PO TABS: 25.0000 mg | ORAL_TABLET | Freq: Three times a day (TID) | ORAL | 0 refills | Status: DC | PRN

## 2023-04-03 MED ORDER — BUSPIRONE HCL 10 MG PO TABS: 10.0000 mg | ORAL_TABLET | Freq: Three times a day (TID) | ORAL | 0 refills | Status: AC

## 2023-04-03 MED ORDER — TRAZODONE HCL 50 MG PO TABS: 50.0000 mg | ORAL_TABLET | Freq: Every evening | ORAL | 0 refills | Status: DC | PRN

## 2023-04-03 MED ORDER — QUETIAPINE FUMARATE 50 MG PO TABS: 50.0000 mg | ORAL_TABLET | ORAL | 0 refills | Status: DC

## 2023-04-03 MED ORDER — GABAPENTIN 100 MG PO CAPS
200.0000 mg | ORAL_CAPSULE | Freq: Three times a day (TID) | ORAL | 0 refills | Status: DC
Start: 2023-04-03 — End: 2023-05-31

## 2023-04-03 MED ORDER — NICOTINE 14 MG/24HR TD PT24: 14.0000 mg | MEDICATED_PATCH | Freq: Every day | TRANSDERMAL | 0 refills | Status: DC

## 2023-04-03 MED ORDER — VENLAFAXINE HCL ER 150 MG PO CP24: 150.0000 mg | ORAL_CAPSULE | Freq: Every day | ORAL | 0 refills | Status: DC

## 2023-04-03 NOTE — Plan of Care (Signed)
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
## 2023-04-03 NOTE — Group Note (Unsigned)
 Date:  04/04/2023 Time:  1:00 AM  Group Topic/Focus:  Wrap-Up Group:   The focus of this group is to help patients review their daily goal of treatment and discuss progress on daily workbooks.    Participation Level:  Active  Participation Quality:  Appropriate and Sharing  Affect:  Appropriate  Cognitive:  Appropriate  Insight: Appropriate  Engagement in Group:  Engaged  Modes of Intervention:  Discussion and Socialization  Additional Comments:  Patients used wrap up group sheets during group and shared. Patient rated his day a 5/10. Patient goal for today "phone calls". Patient did achieve goal. Coping skills the patient finds most helpful, "deep breathing". Something the patient likes about himself, "open minded".   Michael [name] 04/04/2023, 1:00 AM

## 2023-04-03 NOTE — Progress Notes (Signed)
   04/03/23 2028  Psych Admission Type (Psych Patients Only)  Admission Status Voluntary  Psychosocial Assessment  Patient Complaints None  Eye Contact Fair  Facial Expression Animated  Affect Appropriate to circumstance  Speech Logical/coherent  Interaction Assertive  Motor Activity Slow  Appearance/Hygiene Unremarkable  Behavior Characteristics Cooperative;Appropriate to situation  Mood Pleasant  Thought Process  Coherency WDL  Content WDL  Delusions None reported or observed  Perception WDL  Hallucination None reported or observed  Judgment WDL  Confusion None  Danger to Self  Current suicidal ideation? Denies  Description of Suicide Plan none  Agreement Not to Harm Self Yes  Description of Agreement verbal  Danger to Others  Danger to Others None reported or observed

## 2023-04-03 NOTE — Progress Notes (Signed)
   04/02/23 2100  Psych Admission Type (Psych Patients Only)  Admission Status Voluntary  Psychosocial Assessment  Patient Complaints Anhedonia  Eye Contact Fair  Facial Expression Animated  Affect Appropriate to circumstance  Speech Logical/coherent  Interaction Assertive  Motor Activity Slow  Appearance/Hygiene Unremarkable  Behavior Characteristics Cooperative;Appropriate to situation  Mood Pleasant  Thought Process  Coherency WDL  Content WDL  Delusions None reported or observed  Perception WDL  Hallucination None reported or observed  Judgment WDL  Confusion None  Danger to Self  Current suicidal ideation? Denies  Description of Suicide Plan none  Agreement Not to Harm Self Yes  Description of Agreement verbal  Danger to Others  Danger to Others None reported or observed

## 2023-04-03 NOTE — Plan of Care (Signed)
   Problem: Education: Goal: Emotional status will improve Outcome: Progressing Goal: Mental status will improve Outcome: Progressing Goal: Verbalization of understanding the information provided will improve Outcome: Progressing

## 2023-04-03 NOTE — [facility] Group Notes (Signed)
 Pt attended and participated in group activity.

## 2023-04-03 NOTE — [facility] Group Notes (Signed)
 [facility] Group Notes:  (Nursing/MHT/Case Management/Adjunct)  Date:  04/03/2023  Time:  9:05 AM  Type of Therapy:   Goals  Participation Level:  Active  Participation Quality:  Appropriate  Affect:  Appropriate  Cognitive:  Appropriate  Insight:  Appropriate  Engagement in Group:  Engaged  Modes of Intervention:  Orientation  Summary of Progress/Problems:  Azalee Course 04/03/2023, 9:05 AM

## 2023-04-03 NOTE — [facility] Suicide Risk Assessment (Signed)
 [facility] INPATIENT:  Family/Significant Other Suicide Prevention Education  Suicide Prevention Education:  Education Completed; Patient-Michael [name]   The suicide prevention education provided includes the following: Suicide risk factors Suicide prevention and interventions National Suicide Hotline telephone number [facility] assessment telephone number [facility] Emergency Assistance 911 [facility] and/or Residential Mobile Crisis Unit telephone number  Request made of patient to: Remove weapons (e.g., guns, rifles, knives), all items previously/currently identified as safety concern.   Remove drugs/medications (over-the-counter, prescriptions, illicit drugs), all items previously/currently identified as a safety concern.  The patient verbalizes understanding of the suicide prevention education information provided.  The patient does agrees to remove the items of safety concern listed above.  Verna Czech [place] 04/03/2023, 4:08 PM

## 2023-04-03 NOTE — Progress Notes (Signed)
  [facility] Case Management Discharge Plan :  Will you be returning to the same living situation after discharge:  No. At discharge, do you have transportation home?: Yes,  Patient will be transported by Taxi to [facility] on 04/04/23  Do you have the ability to pay for your medications: Yes,  Patient has private insurance  Release of information consent forms completed and in the chart;  Patient's signature needed at discharge.  Patient to Follow up at:  Follow-up Information     [facility] Follow up.   Why: Please call this provider to personally schedule an appointment for therapy services after completing [facility]. Contact information: [address] [place], [place] [address] [phone]         [facility] Follow up.   Why: Please call this provider to schedule an appointment for medication management after completing [facility]. Contact information: [address] [place] [address] [phone]         [facility], [facility] Follow up.   Why: You have been accepted to this treatment facility for substance use. You will begin treatment on 04/04/23 at 9:00AM. You will receive therapy and medication management at this program. Contact information: [address] [place] [address] [phone]                 Next level of care provider has access to [facility] Link:no  Safety Planning and Suicide Prevention discussed: Yes,  completed with patient     Has patient been referred to the Quitline?: Patient does not use tobacco/nicotine products  Patient has been referred for addiction treatment: Patient will present to the [facility] on 04/04/23 at [facility] [place], [place] 04/03/2023, 3:34 PM

## 2023-04-03 NOTE — Group Note (Signed)
 Date:  04/03/2023 Time:  2:48 PM  Group Topic/Focus:  Building Self Esteem:   The Focus of this group is helping patients become aware of the effects of self-esteem on their lives, the things they and others do that enhance or undermine their self-esteem, seeing the relationship between their level of self-esteem and the choices they make and learning ways to enhance self-esteem. Managing Feelings:   The focus of this group is to identify what feelings patients have difficulty handling and develop a plan to handle them in a healthier way upon discharge.    Participation Level:  Active  Participation Quality:  Appropriate  Affect:  Appropriate  Cognitive:  Appropriate  Insight: Appropriate  Engagement in Group:  Engaged  Modes of Intervention:  Socialization  Additional Comments:    [name] 04/03/2023, 2:48 PM

## 2023-04-03 NOTE — [facility] Suicide Risk Assessment (Signed)
 Suicide Risk Assessment  Discharge Assessment    [facility] Discharge Suicide Risk Assessment   Principal Problem: Major depressive disorder, recurrent, severe without psychotic features (HCC) Discharge Diagnoses: Principal Problem:   Major depressive disorder, recurrent, severe without psychotic features (HCC) Active Problems:   GAD (generalized anxiety disorder)   Panic disorder   Severe benzodiazepine use disorder (HCC)   MDD (major depressive disorder)   Total Time spent with patient: 30 minutes  Michael [name] is a 48 y.o. male with a history of MDD, GAD, who was initially admitted for inpatient psychiatric hospitalization on 03/28/2023 for management of severe anxiety, not able to function, racing thoughts and lack of concentration.   Hospital Course  During the patient's hospitalization, patient had extensive initial psychiatric evaluation, and follow-up psychiatric evaluations every day.   Psychiatric diagnoses provided upon initial assessment: Principal Problem:   Major depressive disorder, recurrent, severe without psychotic features (HCC) Active Problems:   GAD (generalized anxiety disorder)   Panic disorder   Severe benzodiazepine use disorder (HCC)   MDD (major depressive disorder)    The following medications were managed:   Upon admission, the following medications were changed / started / discontinued: -Continue home Seroquel 25 mg 3 times daily as needed for anxiety and 50 mg nightly for depression and insomnia -Continue home BuSpar 10 mg 3 times daily for anxiety -Continue home Neurontin 200 mg 3 times daily for neuropathic pain and anxiety -Continue home Effexor ER 75 mg   During the patient's stay, the following medications were changed / started / discontinued, with final adjustments by discharge: -- change seroquel to 50 mg TID for anxiety -- gradually increased effexor to 150 mg   During the hospitalization, patient had the following lab / imaging /  testing abnormalities which require further evaluation / management / treatment: none   Patient's care was discussed during the interdisciplinary team meeting every day during the hospitalization.   The patient denies any side effects to prescribed psychiatric medication.   Gradually, patient started adjusting to milieu. The patient was evaluated each day by a clinical provider to ascertain response to treatment. Improvement was noted by the patient's report of decreasing symptoms, improved sleep and appetite, affect, medication tolerance, behavior, and participation in unit programming.  Patient was asked each day to complete a self inventory noting mood, mental status, pain, new symptoms, anxiety and concerns.     Symptoms were reported as significantly decreased or resolved completely by discharge.    On day of discharge, the patient reports that their mood is stable. The patient denied having suicidal thoughts for more than 48 hours prior to discharge.  Patient denies having homicidal thoughts.  Patient denies having auditory hallucinations.  Patient denies any visual hallucinations or other symptoms of psychosis. The patient was motivated to continue taking medication with a goal of continued improvement in mental health.    The patient reports their target psychiatric symptoms of depression and anxiety responded well to the psychiatric medications, and the patient reports overall benefit other psychiatric hospitalization. Supportive psychotherapy was provided to the patient. The patient also participated in regular group therapy while hospitalized. Coping skills, problem solving as well as relaxation therapies were also part of the unit programming.   Labs were reviewed with the patient, and abnormal results were discussed with the patient.   The patient is able to verbalize their individual safety plan to this provider.   Behavioral Events: none   Restraints: none   # It is  recommended  to the patient to continue psychiatric medications as prescribed, after discharge from the hospital.     # It is recommended to the patient to follow up with your outpatient psychiatric provider and PCP.   # It was discussed with the patient, the impact of alcohol, drugs, tobacco have been there overall psychiatric and medical wellbeing, and total abstinence from substance use was recommended to the patient.   # Prescriptions provided or sent directly to preferred pharmacy at discharge. Patient agreeable to plan. Given opportunity to ask questions. Appears to feel comfortable with discharge.    # In the event of worsening symptoms, the patient is instructed to call the crisis hotline, 911 and or go to the nearest ED for appropriate evaluation and treatment of symptoms. To follow-up with primary care provider for other medical issues, concerns and or health care needs   # [facility]  with a plan to follow up as noted below.   Physical Findings: AIMS:  , ,  ,  ,    CIWA:    COWS:      Mental Status Exam: General Appearance: appears at stated age, casually dressed and groomed    Behavior: pleasant and cooperative    Psychomotor Activity: no psychomotor agitation or retardation noted    Eye Contact: fair  Speech: normal amount, tone, volume and fluency      Mood: euthymic  Affect: congruent, pleasant and interactive    Thought Process: linear, goal directed, no circumstantial or tangential thought process noted, no racing thoughts or flight of ideas  Descriptions of Associations: intact  Thought Content: no bizarre content, logical and future-oriented  Hallucinations: denies AH, VH , does not appear responding to stimuli  Delusions: no paranoia, delusions of control, grandeur, ideas of reference, thought broadcasting, and magical thinking  Suicidal Thoughts: denies SI, intention, plan  Homicidal Thoughts: denies HI, intention, plan    Alertness/Orientation:  alert and fully oriented    Insight: fair, improved  Judgment: fair, improved    Memory: intact    Executive Functions  Concentration: intact  Attention Span: fair  Recall: intact  Fund of Knowledge: fair      Physical Exam  General: Pleasant, well-appearing. No acute distress. Pulmonary: Normal effort. No wheezing or rales. Skin: No obvious rash or lesions. Neuro: A&Ox3.No focal deficit.     Review of Systems  No reported symptoms Blood pressure 108/72, pulse 78, temperature 97.7 F (36.5 C), temperature source Oral, resp. rate 16, height 6\' 3"  (1.905 m), weight 82.8 kg, SpO2 98%. Body mass index is 22.81 kg/m.  Mental Status Per Nursing Assessment::   On Admission:  NA  Demographic Factors:  Male, Caucasian, and Low socioeconomic status Loss Factors: Financial problems/change in socioeconomic status Historical Factors: Impulsivity Risk Reduction Factors:   Positive social support and Positive coping skills or problem solving skills   Continued Clinical Symptoms:  Severe Anxiety and/or Agitation Depression:   Recent sense of peace/wellbeing  Cognitive Features That Contribute To Risk:  Closed-mindedness    Suicide Risk:  Mild: There are no identifiable suicide plans, no associated intent, mild dysphoria and related symptoms, good self-control (both objective and subjective assessment), few other risk factors, and identifiable protective factors, including available and accessible social support.    Follow-up Information     [facility] Follow up.   Why: Please call this provider to personally schedule an appointment for therapy services after completing [facility]. Contact information:  [address], [place] [address] [phone]         [facility] Follow up.   Why: Please call this provider to schedule an appointment for medication management after completing [facility]. Contact information: [address] [place] [address] [phone]         [facility], [facility] Follow up.   Why: You have been accepted to this treatment facility for substance use. You will begin treatment on 04/04/23 at 9:00AM. You will receive therapy and medication management at this program. Contact information: [address] [place] [address] [phone]                 Plan Of Care/Follow-up recommendations:  Activity: as tolerated   Diet: heart healthy   # It is recommended to the patient to continue psychiatric medications as prescribed, after discharge from the hospital.     # It is recommended to the patient to follow up with your outpatient psychiatric provider -instructions on appointment date, time, and address (location) are provided to you in discharge paperwork   # Follow-up with outpatient primary care doctor and other specialists -for management of chronic medical disease, including: HLD   # Testing: Follow-up with outpatient provider for abnormal lab results: high cholesterol   # It was discussed with the patient, the impact of alcohol, drugs, tobacco have been there overall psychiatric and medical wellbeing, and total abstinence from substance use was recommended to the patient.   # Prescriptions provided or sent directly to preferred pharmacy at discharge. Patient agreeable to plan. Given opportunity to ask questions. Appears to feel comfortable with discharge.    # In the event of worsening symptoms, the patient is instructed to call the crisis hotline, 911, and or go to the nearest ED for appropriate evaluation and treatment of symptoms. To follow-up with primary care provider for other medical issues, concerns and or health care needs   Patient agrees with D/C instructions and plan.    I discussed my assessment, planned testing and intervention for the patient with Dr. Abbott Pao who agrees with my  formulated course of action.    Lance Muss, MD 04/03/2023, 3:18 PM

## 2023-04-03 NOTE — Progress Notes (Signed)
   04/03/23 1040  Psych Admission Type (Psych Patients Only)  Admission Status Voluntary  Psychosocial Assessment  Patient Complaints None  Eye Contact Fair  Facial Expression Animated  Affect Appropriate to circumstance  Speech Logical/coherent  Interaction Assertive  Motor Activity Other (Comment) (WDL)  Appearance/Hygiene Unremarkable  Behavior Characteristics Cooperative;Appropriate to situation  Mood Pleasant  Thought Process  Coherency WDL  Content WDL  Delusions None reported or observed  Perception WDL  Hallucination None reported or observed  Judgment Impaired  Confusion None  Danger to Self  Current suicidal ideation? Denies  Agreement Not to Harm Self Yes  Description of Agreement Verbal  Danger to Others  Danger to Others None reported or observed

## 2023-04-04 NOTE — Progress Notes (Signed)
 Pt discharged to lobby waiting taxi.  Pt denies SI/HI/AVH.  Pt feels future directed and stated very grateful for his time here.  Pt denied questions at this time.    04/04/23 0705  AVS(After Visit Summary)/Transition Record with all 11 required elements/ DC SRA(discharge suicide risk assessment) reviewed/given to:  AVS/Transition Record/DC SRA review/given to Patient  (Document Only) If transitioning to an acute care facility  Patient received all 11 transition record data elements. Receiving facility has access to EMR Yes  Patient received all 11 transition record data elements.  Contact for pending studies, follow-up provider, 24 hr/7 day emergency contact, and follow-up plan of care was given to receiving facility Yes

## 2023-04-04 NOTE — Progress Notes (Signed)
   04/04/23 0529  15 Minute Checks  Location Bedroom  Visual Appearance Calm  Behavior Sleeping  Sleep (Behavioral Health Patients Only)  Calculate sleep? (Click Yes once per 24 hr at 0600 safety check) Yes  Documented sleep last 24 hours 7.75

## 2023-05-26 ENCOUNTER — Inpatient Hospital Stay (HOSPITAL_COMMUNITY)
Admission: AD | Admit: 2023-05-26 | Discharge: 2023-05-31 | DRG: 885 | Disposition: A | Discharge status: Home / Self Care | Source: Intra-hospital | Attending: Psychiatry | Admitting: Psychiatry

## 2023-05-26 ENCOUNTER — Emergency Department (HOSPITAL_COMMUNITY)
Admission: EM | Admit: 2023-05-26 | Discharge: 2023-05-26 | Disposition: A | Discharge status: Other Acute Inpatient Hospital | Payer: Self-pay | Attending: Emergency Medicine | Admitting: Emergency Medicine

## 2023-05-26 ENCOUNTER — Other Ambulatory Visit: Payer: Self-pay

## 2023-05-26 ENCOUNTER — Encounter (HOSPITAL_COMMUNITY): Payer: Self-pay | Admitting: Emergency Medicine

## 2023-05-26 DIAGNOSIS — Z8673 Personal history of transient ischemic attack (TIA), and cerebral infarction without residual deficits: Secondary | ICD-10-CM

## 2023-05-26 DIAGNOSIS — Z560 Unemployment, unspecified: Secondary | ICD-10-CM | POA: Diagnosis not present

## 2023-05-26 DIAGNOSIS — F332 Major depressive disorder, recurrent severe without psychotic features: Principal | ICD-10-CM | POA: Diagnosis present

## 2023-05-26 DIAGNOSIS — Z818 Family history of other mental and behavioral disorders: Secondary | ICD-10-CM | POA: Diagnosis not present

## 2023-05-26 DIAGNOSIS — Z5986 Financial insecurity: Secondary | ICD-10-CM | POA: Diagnosis not present

## 2023-05-26 DIAGNOSIS — Z5900 Homelessness unspecified: Secondary | ICD-10-CM

## 2023-05-26 DIAGNOSIS — Z811 Family history of alcohol abuse and dependence: Secondary | ICD-10-CM | POA: Diagnosis not present

## 2023-05-26 DIAGNOSIS — Z79899 Other long term (current) drug therapy: Secondary | ICD-10-CM | POA: Diagnosis not present

## 2023-05-26 DIAGNOSIS — Z5982 Transportation insecurity: Secondary | ICD-10-CM

## 2023-05-26 DIAGNOSIS — Z5941 Food insecurity: Secondary | ICD-10-CM

## 2023-05-26 DIAGNOSIS — F410 Panic disorder [episodic paroxysmal anxiety] without agoraphobia: Secondary | ICD-10-CM | POA: Diagnosis present

## 2023-05-26 DIAGNOSIS — Z91141 Patient's other noncompliance with medication regimen due to financial hardship: Secondary | ICD-10-CM

## 2023-05-26 DIAGNOSIS — F411 Generalized anxiety disorder: Secondary | ICD-10-CM | POA: Diagnosis present

## 2023-05-26 DIAGNOSIS — F17290 Nicotine dependence, other tobacco product, uncomplicated: Secondary | ICD-10-CM | POA: Diagnosis present

## 2023-05-26 DIAGNOSIS — F17210 Nicotine dependence, cigarettes, uncomplicated: Secondary | ICD-10-CM | POA: Diagnosis present

## 2023-05-26 DIAGNOSIS — Y900 Blood alcohol level of less than 20 mg/100 ml: Secondary | ICD-10-CM | POA: Insufficient documentation

## 2023-05-26 DIAGNOSIS — R45851 Suicidal ideations: Secondary | ICD-10-CM | POA: Insufficient documentation

## 2023-05-26 DIAGNOSIS — Z62810 Personal history of physical and sexual abuse in childhood: Secondary | ICD-10-CM

## 2023-05-26 DIAGNOSIS — Z62811 Personal history of psychological abuse in childhood: Secondary | ICD-10-CM

## 2023-05-26 HISTORY — DX: Depression, unspecified: F32.A

## 2023-05-26 HISTORY — DX: Panic disorder (episodic paroxysmal anxiety): F41.0

## 2023-05-26 LAB — COMPREHENSIVE METABOLIC PANEL WITH GFR
ALT: 14 U/L (ref 0–44)
AST: 18 U/L (ref 15–41)
Albumin: 4.2 g/dL (ref 3.5–5.0)
Alkaline Phosphatase: 55 U/L (ref 38–126)
Anion gap: 12 (ref 5–15)
BUN: 11 mg/dL (ref 6–20)
CO2: 25 mmol/L (ref 22–32)
Calcium: 8.7 mg/dL — ABNORMAL LOW (ref 8.9–10.3)
Chloride: 101 mmol/L (ref 98–111)
Creatinine, Ser: 0.57 mg/dL — ABNORMAL LOW (ref 0.61–1.24)
GFR, Estimated: >60 mL/min (ref >60)
Glucose, Bld: 98 mg/dL (ref 70–99)
Potassium: 3.1 mmol/L — ABNORMAL LOW (ref 3.5–5.1)
Sodium: 138 mmol/L (ref 135–145)
Total Bilirubin: 0.9 mg/dL (ref 0.0–1.2)
Total Protein: 7.6 g/dL (ref 6.5–8.1)

## 2023-05-26 LAB — CBC WITH DIFFERENTIAL/PLATELET
Abs Immature Granulocytes: 0.02 10*3/uL (ref 0.00–0.07)
Basophils Absolute: 0.1 10*3/uL (ref 0.0–0.1)
Basophils Relative: 1 %
Eosinophils Absolute: 0.1 10*3/uL (ref 0.0–0.5)
Eosinophils Relative: 2 %
HCT: 42.3 % (ref 39.0–52.0)
Hemoglobin: 14 g/dL (ref 13.0–17.0)
Immature Granulocytes: 0 %
Lymphocytes Relative: 31 %
Lymphs Abs: 2.1 10*3/uL (ref 0.7–4.0)
MCH: 31.4 pg (ref 26.0–34.0)
MCHC: 33.1 g/dL (ref 30.0–36.0)
MCV: 94.8 fL (ref 80.0–100.0)
Monocytes Absolute: 0.5 10*3/uL (ref 0.1–1.0)
Monocytes Relative: 8 %
Neutro Abs: 4 10*3/uL (ref 1.7–7.7)
Neutrophils Relative %: 58 %
Platelets: 276 10*3/uL (ref 150–400)
RBC: 4.46 MIL/uL (ref 4.22–5.81)
RDW: 13.2 % (ref 11.5–15.5)
WBC: 6.9 10*3/uL (ref 4.0–10.5)
nRBC: 0 % (ref 0.0–0.2)

## 2023-05-26 LAB — ETHANOL: Alcohol, Ethyl (B): <10 mg/dL (ref <10)

## 2023-05-26 LAB — RAPID URINE DRUG SCREEN, HOSP PERFORMED
Amphetamines: NOT DETECTED
Barbiturates: NOT DETECTED
Benzodiazepines: NOT DETECTED
Cocaine: NOT DETECTED
Opiates: NOT DETECTED
Tetrahydrocannabinol: NOT DETECTED

## 2023-05-26 MED ORDER — HALOPERIDOL LACTATE 5 MG/ML IJ SOLN: 5.0000 mg | Freq: Three times a day (TID) | INTRAMUSCULAR | Status: DC | PRN

## 2023-05-26 MED ORDER — TRAZODONE HCL 50 MG PO TABS
50.0000 mg | ORAL_TABLET | Freq: Every evening | ORAL | Status: DC | PRN
  Filled 2023-05-26: qty 1

## 2023-05-26 MED ORDER — HALOPERIDOL LACTATE 5 MG/ML IJ SOLN: 10.0000 mg | Freq: Three times a day (TID) | INTRAMUSCULAR | Status: DC | PRN

## 2023-05-26 MED ORDER — POTASSIUM CHLORIDE CRYS ER 20 MEQ PO TBCR
30.0000 meq | EXTENDED_RELEASE_TABLET | Freq: Two times a day (BID) | ORAL | Status: DC
  Administered 2023-05-26: 30 meq via ORAL
  Filled 2023-05-26: qty 1

## 2023-05-26 MED ORDER — HYDROXYZINE HCL 25 MG PO TABS
25.0000 mg | ORAL_TABLET | Freq: Three times a day (TID) | ORAL | Status: DC | PRN
Start: 2023-05-26 — End: 2023-05-26
  Administered 2023-05-26 (×2): 25 mg via ORAL
  Filled 2023-05-26 (×2): qty 1

## 2023-05-26 MED ORDER — DIPHENHYDRAMINE HCL 50 MG/ML IJ SOLN: 50.0000 mg | Freq: Three times a day (TID) | INTRAMUSCULAR | Status: DC | PRN

## 2023-05-26 MED ORDER — LORAZEPAM 2 MG/ML IJ SOLN: 2.0000 mg | Freq: Three times a day (TID) | INTRAMUSCULAR | Status: DC | PRN

## 2023-05-26 MED ORDER — ALUM & MAG HYDROXIDE-SIMETH 200-200-20 MG/5ML PO SUSP: 30.0000 mL | ORAL | Status: DC | PRN

## 2023-05-26 MED ORDER — VENLAFAXINE HCL ER 75 MG PO CP24: 150.0000 mg | ORAL_CAPSULE | Freq: Every day | ORAL | Status: DC

## 2023-05-26 MED ORDER — MAGNESIUM HYDROXIDE 400 MG/5ML PO SUSP: 30.0000 mL | Freq: Every day | ORAL | Status: DC | PRN

## 2023-05-26 MED ORDER — ACETAMINOPHEN 325 MG PO TABS
650.0000 mg | ORAL_TABLET | Freq: Four times a day (QID) | ORAL | Status: DC | PRN
  Administered 2023-05-27 – 2023-05-31 (×3): 650 mg via ORAL
  Filled 2023-05-26 (×3): qty 2

## 2023-05-26 MED ORDER — NICOTINE 14 MG/24HR TD PT24
14.0000 mg | MEDICATED_PATCH | Freq: Every day | TRANSDERMAL | Status: DC
  Administered 2023-05-26: 14 mg via TRANSDERMAL
  Filled 2023-05-26: qty 1

## 2023-05-26 MED ORDER — DIPHENHYDRAMINE HCL 25 MG PO CAPS: 50.0000 mg | ORAL_CAPSULE | Freq: Three times a day (TID) | ORAL | Status: DC | PRN

## 2023-05-26 MED ORDER — QUETIAPINE FUMARATE 50 MG PO TABS
50.0000 mg | ORAL_TABLET | ORAL | Status: DC
  Administered 2023-05-26 (×2): 50 mg via ORAL
  Filled 2023-05-26 (×2): qty 1

## 2023-05-26 MED ORDER — HALOPERIDOL 5 MG PO TABS: 5.0000 mg | ORAL_TABLET | Freq: Three times a day (TID) | ORAL | Status: DC | PRN

## 2023-05-26 NOTE — ED Notes (Signed)
 TTS in progress

## 2023-05-26 NOTE — ED Notes (Signed)
 Pt woke up requesting snack. Snack provided. Vitals obtained. Meds administered.

## 2023-05-26 NOTE — ED Notes (Signed)
 Attempted to wake patient and offer snack, pt continued to sleep

## 2023-05-26 NOTE — ED Notes (Signed)
Pt wanded by security with no findings. 

## 2023-05-26 NOTE — ED Notes (Addendum)
 Pt has x2 belonging bags, placed in [address] hall c belonging cabinet.

## 2023-05-26 NOTE — ED Triage Notes (Signed)
 Pt arrived POV for SI, reports he "hates life, I don't wanna be here anymore", feeling exhausted, feels life is "not worth living anymore" Pt st he has not plans, has not acted on any St attempts in the past. Denies drug use, does not drink, but did take "one shot tonight". Denies HI. Pt calm and cooperative at this time. A&O x4. Hx of depression and anxiety.
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
## 2023-05-26 NOTE — ED Notes (Signed)
 Pt advises being unable to void at this time, and is aware need of urine specimen.

## 2023-05-26 NOTE — Consult Note (Signed)
 [facility] Psychiatric Consult Initial  Patient Name: .Michael [name]  MRN: [id number]  DOB: [birth year]  Consult Order details:  Orders (From admission, onward)     Start     Ordered   05/26/23 0401  CONSULT TO CALL ACT TEAM       Ordering Provider: Pete Pelt, PA  Provider:  (Not yet assigned)  Question:  Reason for Consult?  Answer:  SI   05/26/23 0400             Mode of Visit: In person    Psychiatry Consult Evaluation  Service Date: May 26, 2023 LOS:  LOS: 0 days  Chief Complaint The patient is a 48 year old male with history of depression and suicidal ideations. He is voluntary and is requesting help.   Primary Psychiatric Diagnoses  Major depressive disorder without psychotic features 2.  Generalized anxiety disorder 3. Panic Disorder  Assessment  Michael [name] is a 48 y.o. male admitted: Presented to the ED on 05/26/2023  2:55 AM for suicidal ideations reporting that he takes And she does not want to be here anymore, feels that life is not worth living anymore. He carries the psychiatric diagnoses of major depressive disorder, and anxiety, panic disorder and has a past medical history of headaches, TGA.   His current presentation of hopelessness, low mood, changes in appetite and sleep is most consistent with known diagnosis of MDD. Current outpatient psychotropic medications include gabapentin, Atarax, Seroquel, Effexor, and trazodone and historically he has had a positive response to these medications. He was non compliant with medications prior to admission as evidenced by patient report. On initial examination, patient pleasant and cooperative. Please see plan below for detailed recommendations.   Diagnoses:  Active Hospital problems: Principal Problem:   Major depressive disorder, recurrent severe without psychotic features (HCC)    Plan   ## Psychiatric Medication Recommendations:  Continue patient's home medications  ## Medical Decision Making  Capacity: Not specifically addressed in this encounter  ## Further Work-up:  -- No further workup needed at this time EKG or UDS -- Updated EKG ordered -- Pertinent labwork reviewed earlier this admission includes: EKG, CBC, UDS, CMP   ## Disposition:-- We recommend inpatient psychiatric hospitalization. Patient is under voluntary admission status at this time; please IVC if attempts to leave hospital.  ## Behavioral / Environmental: -To minimize splitting of staff, assign one staff person to communicate all information from the team when feasible. or Utilize compassion and acknowledge the patient's experiences while setting clear and realistic expectations for care.    ## Safety and Observation Level:  - Based on my clinical evaluation, I estimate the patient to be at low risk of self harm in the current setting. - At this time, we recommend  routine. This decision is based on my review of the chart including patient's history and current presentation, interview of the patient, mental status examination, and consideration of suicide risk including evaluating suicidal ideation, plan, intent, suicidal or self-harm behaviors, risk factors, and protective factors. This judgment is based on our ability to directly address suicide risk, implement suicide prevention strategies, and develop a safety plan while the patient is in the clinical setting. Please contact our team if there is a concern that risk level has changed.  CSSR Risk Category:C-SSRS RISK CATEGORY: Low Risk  Suicide Risk Assessment: Patient has following modifiable risk factors for suicide: active suicidal ideation, untreated depression, social isolation, and medication noncompliance, which we are addressing by recommended inpatient  psychiatric admission. Patient has following non-modifiable or demographic risk factors for suicide: male gender, separation or divorce, history of suicide attempt, and psychiatric hospitalization Patient  has the following protective factors against suicide: Supportive friends  Thank you for this consult request. Recommendations have been communicated to the primary team.  We will recommend inpatient psychiatric admission and continue to follow patient at this time.   Alona Bene, PMHNP       History of Present Illness  Relevant Aspects of Hospital ED Course:  Admitted on 05/26/2023 for suicidal thoughts, reporting stating life and does not want to be here anymore, does not feel like life is worth living.  Patient Report:  [name], 48 y.o., male patient seen face to face by this provider, consulted with Dr. Enedina Finner; and chart reviewed on 05/26/23.  On evaluation Michael [name] reports that he was diagnosed with anxiety disorder at age 38 and that he tried many Medications but failed.  Nothing was effective until he was started on Xanax.  He was on Xanax for some time but same was discontinued last October when he was hospitalized in [facility].  Patient states that he was just discharged from behavioral health [facility] and February 2025, states that since he has been discharged in February he has not been compliant with his medications, due to not being able to afford them.  He states that he has been staying with a friend, the living situation has not been going well due to his decline in mental health, and depression and he states that his friend told him that he had to leave the home.  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  He states that he has a 3 year old daughter that he does not talk to, due to his current life situation of being "down and out "he states he does not want his daughter to see him like this.   Patient reports poor sleep, severe anxiety, poor concentration and poor memory as his symptoms.  He reports he has no family members.  Patient is alert  and oriented x4, he denies SI/HI/AVH but reports feeling hopeless and helpless due to severe anxiety and panic attack. He has normal speech, and behavior.  Objectively there is no evidence of psychosis/mania or delusional thinking.  Patient is able to converse coherently, goal directed thoughts, no distractibility, or pre-occupation.  He denies self-harm/homicidal ideation, psychosis, and paranoia.  Patient does endorse SI with a plan, would not disclose this plan, but says he is always feels suicidal but now he has a plan and is thinking about it and it scares him, "I do not see any other, or any way out.  "He is states that he lost everything during COVID, and cannot seem to catch up to try and get it back. PHQ-9 score is a 15.     Psych ROS:  Depression: yes Anxiety:  yes Mania (lifetime and current): na Psychosis: (lifetime and current): na   Collateral information:  Contacted :NA, States he has no family members.   Review of Systems  Psychiatric/Behavioral:  Positive for depression and suicidal ideas.      Psychiatric and Social History  Psychiatric History:  Information collected from patient/Record Review   Prev Dx/Sx: MDD, Anxiety, Panic Disorder Current Psych Provider: na Home Meds (current): see above Previous Med Trials: Xanax, Mirtazapine, Effexor, Hydroxyzine. Therapy:  Denies   Prior Psych Hospitalization: [facility], [facility]  Prior Self Harm: Denies Prior Violence: denies   Family Psych History: Depression Family Hx suicide: denies   Social History:  Developmental Hx: normal Educational Hx: GED Occupational Hx: Unemployed Legal Hx: denies Living Situation: homeless Spiritual Hx: denies Access to weapons/lethal means: na    Substance History Alcohol: Denies  Illicit drugs: Cannabis Prescription drug abuse:  Denies Rehab hx: denies  Exam Findings  Physical Exam:  Vital Signs:  Temp:  [97.8 F (36.6 C)-98.1 F (36.7 C)] 97.8 F (36.6 C) (04/17 1119) Pulse Rate:   [69-79] 79 (04/17 1119) Resp:  [17-18] 17 (04/17 1119) BP: (108-137)/(74-89) 108/74 (04/17 1119) SpO2:  [99 %-100 %] 99 % (04/17 1119) Weight:  [86.2 kg] 86.2 kg (04/17 0313) Blood pressure 108/74, pulse 79, temperature 97.8 F (36.6 C), temperature source Oral, resp. rate 17, height 6\' 3"  (1.905 m), weight 86.2 kg, SpO2 99%. Body mass index is 23.75 kg/m.  Physical Exam Vitals and nursing note reviewed. Exam conducted with a chaperone present.  Neurological:     Mental Status: He is alert.  Psychiatric:        Attention and Perception: Attention normal.        Mood and Affect: Mood is depressed. Affect is flat.        Speech: Speech normal.        Behavior: Behavior is cooperative.        Thought Content: Thought content includes suicidal ideation. Thought content includes suicidal plan.        Cognition and Memory: Memory normal.        Judgment: Judgment is impulsive.     Mental Status Exam: General Appearance: Casual, Guarded, and Neat  Orientation:  Full (Time, Place, and Person)  Memory:  Immediate;   Good Recent;   Good Remote;   Good  Concentration:  Concentration: Good and Attention Span: Good  Recall:  Good  Attention  Fair  Eye Contact:  Fair  Speech:  Clear and Coherent and Normal Rate  Language:  Good  Volume:  Normal  Mood: "anxious, depressed"  Affect:  Congruent  Thought Process:  Coherent  Thought Content:  Logical  Suicidal Thoughts:  No  Homicidal Thoughts:  No  Judgement:  Fair  Insight:  Fair  Psychomotor Activity:  Psychomotor Retardation  Akathisia:  NA  Fund of Knowledge:  Fair     Assets:  Communication Skills  Cognition:  WNL  ADL's:  Intact  AIMS (if indicated):        Other History   These have been pulled in through the EMR, reviewed, and updated if appropriate.  Family History:  The patient's family history is not on file.  Medical History: Past Medical History:  Diagnosis Date   Anxiety    Depression    Panic attack     TGA (transient global amnesia)    TIA (transient ischemic attack)     Surgical History: History reviewed. No pertinent surgical history.   Medications:  No current facility-administered medications for this encounter.  Current Outpatient Medications:    gabapentin (NEURONTIN) 100 MG capsule, Take 2 capsules (200 mg total) by mouth 3 (three) times daily., Disp: 180 capsule, Rfl: 0   hydrOXYzine (ATARAX) 25 MG tablet, Take 1 tablet (25 mg total) by mouth 3 (three) times daily as needed for anxiety., Disp: 30 tablet, Rfl: 0   nicotine (NICODERM CQ - DOSED IN MG/24 HOURS) 14 mg/24hr patch, Place 1 patch (14  mg total) onto the skin daily., Disp: 28 patch, Rfl: 0   QUEtiapine (SEROQUEL) 50 MG tablet, Take 1 tablet (50 mg total) by mouth 2 (two) times daily and at bedtime., Disp: 90 tablet, Rfl: 0   traZODone (DESYREL) 50 MG tablet, Take 1 tablet (50 mg total) by mouth at bedtime as needed for sleep., Disp: 30 tablet, Rfl: 0   venlafaxine XR (EFFEXOR-XR) 150 MG 24 hr capsule, Take 1 capsule (150 mg total) by mouth daily with breakfast., Disp: 30 capsule, Rfl: 0  Allergies: No Known Allergies  Kaidan Harpster MOTLEY-MANGRUM, PMHNP

## 2023-05-26 NOTE — ED Notes (Signed)
 Pt resting, eyes closed, respirations equal and unlabored, NAD noted, care ongoing.

## 2023-05-26 NOTE — Progress Notes (Signed)
 [facility] LCSW Progress Note   05/26/2023    6:37 PM  Michael [name]   [id number]   Type of Contact and Topic:  Psychiatric Bed Placement   Pt accepted to [facility] 403-2   Patient meets inpatient criteria per [name], PMHNP    The attending provider will be Dr. Linnie Riches  Call report to [phone]    Ailene Housekeeper, EMT @ [facility] ED notified.     Pt scheduled  to arrive at [facility] TODAY @ 2230.   Phares Brasher, MSW, LCSW-A  6:38 PM 05/26/2023

## 2023-05-26 NOTE — ED Provider Notes (Signed)
 [facility] AT [facility] Provider Note   CSN: [id number] Arrival date & time: 05/26/23  0249     History  Chief Complaint  Patient presents with   Suicidal    Michael [name] is a 48 y.o. male, history of anxiety, TIA, who presents to the ED secondary to suicidal ideation for several months.  He states for the last few months he is wanting to kill himself, but does not have a plan.  Denies any auditory or visual hallucinations or homicidal ideation.  He states he so depressed because he lost his job and he is homeless.  He states he does drink from time to time, but does not drink daily.  Does not use any IV drugs.  States he would like some help, because he does not want to kill himself.  Home Medications Prior to Admission medications   Medication Sig Start Date End Date Taking? Authorizing Provider  gabapentin  (NEURONTIN ) 100 MG capsule Take 2 capsules (200 mg total) by mouth 3 (three) times daily. 04/03/23 05/03/23  Hoang, Daniela B, MD  hydrOXYzine  (ATARAX ) 25 MG tablet Take 1 tablet (25 mg total) by mouth 3 (three) times daily as needed for anxiety. 04/03/23   Joice Nares, MD  nicotine  (NICODERM CQ  - DOSED IN MG/24 HOURS) 14 mg/24hr patch Place 1 patch (14 mg total) onto the skin daily. 04/04/23   Joice Nares, MD  QUEtiapine  (SEROQUEL ) 50 MG tablet Take 1 tablet (50 mg total) by mouth 2 (two) times daily and at bedtime. 04/03/23   Hoang, Daniela B, MD  traZODone  (DESYREL ) 50 MG tablet Take 1 tablet (50 mg total) by mouth at bedtime as needed for sleep. 04/03/23   Joice Nares, MD  venlafaxine  XR (EFFEXOR -XR) 150 MG 24 hr capsule Take 1 capsule (150 mg total) by mouth daily with breakfast. 04/04/23   Joice Nares, MD      Allergies    Patient has no known allergies.    Review of Systems   Review of Systems  Psychiatric/Behavioral:  Positive for suicidal ideas. Negative for hallucinations.     Physical Exam Updated Vital Signs BP  137/89 (BP Location: Right Arm)   Pulse 71   Temp 98.1 F (36.7 C) (Oral)   Resp 18   Ht 6\' 3"  (1.905 m)   Wt 86.2 kg   SpO2 100%   BMI 23.75 kg/m  Physical Exam Vitals and nursing note reviewed.  Constitutional:      General: He is not in acute distress.    Appearance: He is well-developed.  HENT:     Head: Normocephalic and atraumatic.  Eyes:     Conjunctiva/sclera: Conjunctivae normal.  Cardiovascular:     Rate and Rhythm: Normal rate and regular rhythm.     Heart sounds: No murmur heard. Pulmonary:     Effort: Pulmonary effort is normal. No respiratory distress.     Breath sounds: Normal breath sounds.  Abdominal:     Palpations: Abdomen is soft.     Tenderness: There is no abdominal tenderness.  Musculoskeletal:        General: No swelling.     Cervical back: Neck supple.  Skin:    General: Skin is warm and dry.     Capillary Refill: Capillary refill takes less than 2 seconds.  Neurological:     Mental Status: He is alert.  Psychiatric:        Mood and Affect: Mood normal.  ED Results / Procedures / Treatments   Labs (all labs ordered are listed, but only abnormal results are displayed) Labs Reviewed  COMPREHENSIVE METABOLIC PANEL WITH GFR - Abnormal; Notable for the following components:      Result Value   Potassium 3.1 (*)    Creatinine, Ser 0.57 (*)    Calcium 8.7 (*)    All other components within normal limits  ETHANOL  CBC WITH DIFFERENTIAL/PLATELET  RAPID URINE DRUG SCREEN, HOSP PERFORMED    EKG None  Radiology No results found.  Procedures Procedures    Medications Ordered in ED Medications - No data to display  ED Course/ Medical Decision Making/ A&P                                 Medical Decision Making Patient is here for suicidal ideation without plan.  He is well-appearing, and states he is homeless and has a new job, which is making him want to kill himself.  We will medically evaluate him, and then have psychiatry  see.  Amount and/or Complexity of Data Reviewed Labs: ordered.    Details: Labs unremarkable Discussion of management or test interpretation with external provider(s): Patient's labs are unremarkable, he is medically cleared, should be seen by psychiatry.  Has not had any head trauma, or any fevers or chills.  No confusion well-appearing, and makes coherent statements.   Final Clinical Impression(s) / ED Diagnoses Final diagnoses:  None    Rx / DC Orders ED Discharge Orders     None         Haileyann Staiger, Dwaine Gip, PA 05/26/23 0401    Lindle Rhea, MD 05/27/23 1821

## 2023-05-27 ENCOUNTER — Encounter (HOSPITAL_COMMUNITY): Payer: Self-pay

## 2023-05-27 DIAGNOSIS — F332 Major depressive disorder, recurrent severe without psychotic features: Secondary | ICD-10-CM | POA: Diagnosis not present

## 2023-05-27 LAB — BASIC METABOLIC PANEL WITH GFR
Anion gap: 10 (ref 5–15)
BUN: 13 mg/dL (ref 6–20)
CO2: 25 mmol/L (ref 22–32)
Calcium: 8.8 mg/dL — ABNORMAL LOW (ref 8.9–10.3)
Chloride: 103 mmol/L (ref 98–111)
Creatinine, Ser: 0.91 mg/dL (ref 0.61–1.24)
GFR, Estimated: >60 mL/min (ref >60)
Glucose, Bld: 109 mg/dL — ABNORMAL HIGH (ref 70–99)
Potassium: 3.8 mmol/L (ref 3.5–5.1)
Sodium: 138 mmol/L (ref 135–145)

## 2023-05-27 LAB — TSH: TSH: 2.185 u[IU]/mL (ref 0.350–4.500)

## 2023-05-27 LAB — VITAMIN B12: Vitamin B-12: 173 pg/mL — ABNORMAL LOW (ref 180–914)

## 2023-05-27 LAB — VITAMIN D 25 HYDROXY (VIT D DEFICIENCY, FRACTURES): Vit D, 25-Hydroxy: 35.74 ng/mL (ref 30–100)

## 2023-05-27 LAB — FOLATE: Folate: 9.9 ng/mL (ref >5.9)

## 2023-05-27 MED ORDER — GABAPENTIN 100 MG PO CAPS: 200.0000 mg | ORAL_CAPSULE | Freq: Three times a day (TID) | ORAL | Status: DC

## 2023-05-27 MED ORDER — QUETIAPINE FUMARATE 100 MG PO TABS
100.0000 mg | ORAL_TABLET | ORAL | Status: DC
  Administered 2023-05-27 – 2023-05-28 (×3): 100 mg via ORAL
  Filled 2023-05-27 (×11): qty 1

## 2023-05-27 MED ORDER — HYDROXYZINE HCL 25 MG PO TABS
25.0000 mg | ORAL_TABLET | ORAL | Status: DC | PRN
  Administered 2023-05-28 – 2023-05-31 (×8): 25 mg via ORAL
  Filled 2023-05-27 (×8): qty 1

## 2023-05-27 MED ORDER — GABAPENTIN 100 MG PO CAPS
200.0000 mg | ORAL_CAPSULE | Freq: Three times a day (TID) | ORAL | Status: DC
  Administered 2023-05-27 – 2023-05-29 (×7): 200 mg via ORAL
  Filled 2023-05-27 (×13): qty 2

## 2023-05-27 MED ORDER — QUETIAPINE FUMARATE 50 MG PO TABS: 50.0000 mg | ORAL_TABLET | ORAL | Status: DC

## 2023-05-27 MED ORDER — DESVENLAFAXINE SUCCINATE ER 50 MG PO TB24
50.0000 mg | ORAL_TABLET | Freq: Every day | ORAL | Status: DC
  Administered 2023-05-27 – 2023-05-29 (×3): 50 mg via ORAL
  Filled 2023-05-27 (×5): qty 1

## 2023-05-27 MED ORDER — NICOTINE 14 MG/24HR TD PT24
14.0000 mg | MEDICATED_PATCH | Freq: Every day | TRANSDERMAL | Status: DC
  Administered 2023-05-27 – 2023-05-31 (×5): 14 mg via TRANSDERMAL
  Filled 2023-05-27 (×7): qty 1

## 2023-05-27 MED ORDER — TRAZODONE HCL 50 MG PO TABS
50.0000 mg | ORAL_TABLET | Freq: Every evening | ORAL | Status: DC | PRN
Start: 2023-05-27 — End: 2023-05-31
  Administered 2023-05-27 – 2023-05-30 (×4): 50 mg via ORAL
  Filled 2023-05-27 (×3): qty 1
  Filled 2023-05-27: qty 7
  Filled 2023-05-27: qty 1

## 2023-05-27 NOTE — Group Note (Signed)
 Date:  05/27/2023 Time:  4:38 PM  Setbacks in Recovery: The purpose of the group was to utilize concepts from CBT therapy to promote emotional wellness. More specifically, we identified and defined unhelpful thought patterns that could be the first step in changing the way participants think while in recovery from a mental health crisis.    Participation Level:  Active  Participation Quality:  Appropriate  Affect:  Appropriate  Cognitive:  Appropriate  Insight: Appropriate  Engagement in Group:  Engaged  Modes of Intervention:  Discussion  Additional Comments:    Michael [name] 05/27/2023, 4:38 PM

## 2023-05-27 NOTE — Progress Notes (Signed)
 Patient ID: Michael [name], male   DOB: February 18, 1975, 48 y.o.   MRN: [id number]  Admission Note:  D:47 yr male who presents [facility] in no acute distress for the treatment of SI and Depression. Pt appears flat and depressed. Pt was calm and cooperative with admission process. Pt presents with passive SI and contracts for safety upon admission. Pt denies AVH / Pain at this time . Pt UDS -ve, but pt stated he was using Kratom before admission. Pt stated he stopped taking his meds because he could not afford them , then he became suicidal and started to use substances.   Per Assessment: Michael [name] is a 48 y.o. male, history of anxiety, TIA, who presents to the ED secondary to suicidal ideation for several months.  He states for the last few months he is wanting to kill himself, but does not have a plan.  Denies any auditory or visual hallucinations or homicidal ideation.  He states he so depressed because he lost his job and he is homeless.  He states he does drink from time to time, but does not drink daily.  Does not use any IV drugs.  States he would like some help, because he does not want to kill himself.   Pt was feeling desparate and came to [facility]. He says he lost his job this week. He feels like he is on a roller coaster emotionally. At times he feels like his wants to kill himself. He has a lot of anxiety and sadness. "I'm okay if my life is over." Except for his daughter he feels like there is nothing left in life for him. He has been thinking about "I would just take some pills." Patient says that he has not had a previous attempt but he has put "a lot of thought into this." Pt denies any HI or A/V hallucinations. Denies access to guns. Patient went to [facility] on Frebruary 17-24, '25. He followed up with going to [facility] for 2.5 weeks. He was supposed to follow up with [facility] and [facility] but he was not aware of that follow up when he left [facility].   A: Skin was assessed  and found to be clear of any abnormal marks apart from Tattoos both arms. PT searched and no contraband found, POC and unit policies explained and understanding verbalized. Consents obtained. Food and fluids offered, and  accepted.   R:Pt had no additional questions or concerns.

## 2023-05-27 NOTE — Progress Notes (Signed)
 Patient rated his depression level 6/10 and his anxiety level 5/10 with 10 being the highest and 0 none. Pt identified his goal for today as, "Finding somewhere to live". Medication and group compliant. Pt observed interacting well with some peers. Appetite good on shift. Safety maintained.  05/27/23 0925  Psych Admission Type (Psych Patients Only)  Admission Status Voluntary  Psychosocial Assessment  Patient Complaints Anxiety;Depression  Eye Contact Fair  Facial Expression Anxious;Sad  Affect Anxious;Depressed  Speech Logical/coherent  Interaction Assertive  Motor Activity Other (Comment) (WNL)  Appearance/Hygiene In scrubs  Behavior Characteristics Cooperative;Appropriate to situation  Mood Depressed;Anxious;Pleasant  Thought Process  Coherency WDL  Content WDL  Delusions None reported or observed  Perception WDL  Hallucination None reported or observed  Judgment Impaired  Confusion None  Danger to Self  Current suicidal ideation? Denies  Agreement Not to Harm Self Yes  Description of Agreement Verbal  Danger to Others  Danger to Others None reported or observed

## 2023-05-27 NOTE — Group Note (Signed)
 Date:  05/27/2023 Time:  10:11 AM  Group Topic/Focus:  Goals Group:   The focus of this group is to help patients establish daily goals to achieve during treatment and discuss how the patient can incorporate goal setting into their daily lives to aide in recovery. Orientation:   The focus of this group is to educate the patient on the purpose and policies of crisis stabilization and provide a format to answer questions about their admission.  The group details unit policies and expectations of patients while admitted.    Participation Level:  Active  Participation Quality:  Appropriate  Affect:  Appropriate  Cognitive:  Appropriate  Insight: Appropriate  Engagement in Group:  Engaged  Modes of Intervention:  Discussion and Orientation  Additional Comments:    Jordain Radin D Klynn Linnemann 05/27/2023, 10:11 AM

## 2023-05-27 NOTE — [facility] Suicide Risk Assessment (Signed)
 [facility] INPATIENT:  Family/Significant Other Suicide Prevention Education  Suicide Prevention Education:  Patient Refusal for Family/Significant Other Suicide Prevention Education: The patient Michael [name] has refused to provide written consent for family/significant other to be provided Family/Significant Other Suicide Prevention Education during admission and/or prior to discharge.  Physician notified.  Vonzell Guerin 05/27/2023, 1:17 PM
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
## 2023-05-27 NOTE — Group Note (Unsigned)
 Occupational Therapy Group Note  Group Topic:Coping Skills  Group Date: 05/27/2023 Start Time: 1400 End Time: 1430 Facilitators: Dot [place] MATSU, OT   Group Description: Group encouraged increased engagement and participation through discussion and activity focused on Coping Ahead. Patients were split up into teams and selected a card from a stack of positive coping strategies. Patients were instructed to act out/charade the coping skill for other peers to guess and receive points for their team. Discussion followed with a focus on identifying additional positive coping strategies and patients shared how they were going to cope ahead over the weekend while continuing hospitalization stay.  Therapeutic Goal(s): Identify positive vs negative coping strategies. Identify coping skills to be used during hospitalization vs coping skills outside of hospital/at home Increase participation in therapeutic group environment and promote engagement in treatment   Participation Level: {OT [facility] Participation Ozczo:73732}   Participation Quality: {OT [facility] Participation Quality:26268}   Behavior: {[facility] OT Group Behavior:26269}   Speech/Thought Process: {[facility] OT Speech/Thought Process:26270}   Affect/Mood: {OT [facility] Affect/Mood:26271}   Insight: {OT [facility] Insight:26272}   Judgement: {OT [facility] Judgement:26272}   Individualization: *** was *** in their participation of group discussion/activity. *** identified  Modes of Intervention: {[facility] MODES OF INTERVENTION:26273}  Patient Response to Interventions:  {[facility] OT Patient Response to Interventions:26274}   Plan: Continue to engage patient in OT groups 2 - 3x/week.  05/27/2023  [place] MATSU Dot, OT

## 2023-05-27 NOTE — Tx Team (Signed)
 Initial Treatment Plan 05/27/2023 1:03 AM Michael [name] FMW:[id number]    PATIENT STRESSORS: Financial difficulties   Marital or family conflict   Medication change or noncompliance     PATIENT STRENGTHS: General fund of knowledge  Motivation for treatment/growth    PATIENT IDENTIFIED PROBLEMS: Risk for SI  Depression  SA (Kratom)  Getting my life together               DISCHARGE CRITERIA:  Adequate post-discharge living arrangements Improved stabilization in mood, thinking, and/or behavior Verbal commitment to aftercare and medication compliance  PRELIMINARY DISCHARGE PLAN: Attend aftercare/continuing care group Attend PHP/IOP Outpatient therapy  PATIENT/FAMILY INVOLVEMENT: This treatment plan has been presented to and reviewed with the patient, Michael [name].  The patient and family have been given the opportunity to ask questions and make suggestions.  Orlando Ozell LABOR, RN 05/27/2023, 1:03 AM

## 2023-05-27 NOTE — Group Note (Signed)
 Occupational Therapy Group Note  Group Topic:Coping Skills  Group Date: 05/26/2023 Start Time: 1400 End Time: 1430 Facilitators: Lynnda Sas, OT   Group Description: Group encouraged increased engagement and participation through discussion and activity focused on "Coping Ahead." Patients were split up into teams and selected a card from a stack of positive coping strategies. Patients were instructed to act out/charade the coping skill for other peers to guess and receive points for their team. Discussion followed with a focus on identifying additional positive coping strategies and patients shared how they were going to cope ahead over the weekend while continuing hospitalization stay.  Therapeutic Goal(s): Identify positive vs negative coping strategies. Identify coping skills to be used during hospitalization vs coping skills outside of hospital/at home Increase participation in therapeutic group environment and promote engagement in treatment   Participation Level: Minimal   Participation Quality: Independent   Behavior: Appropriate   Speech/Thought Process: Relevant   Affect/Mood: Flat   Insight: Fair   Judgement: Fair      Modes of Intervention: Education  Patient Response to Interventions:  Attentive   Plan: Continue to engage patient in OT groups 2 - 3x/week.  05/27/2023  Lynnda Sas, OT  Yorel Redder, OT
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
## 2023-05-27 NOTE — Group Note (Signed)
 Recreation Therapy Group Note   Group Topic:Leisure Education  Group Date: 05/27/2023 Start Time: 0930 End Time: 1030 Facilitators: Rye Dorado-McCall, LRT,CTRS Location: [address] Dayroom   Group Topic: Leisure Education   Goal Area(s) Addresses:  Patient will successfully identify positive leisure and recreation activities.  Patient will acknowledge benefits of participation in healthy leisure activities post discharge.  Patient will actively work with peers toward a shared goal.   Intervention: Competitive Group Game   Activity: Guess The Lyric. The game consisted of 6 categories ([address], [place], Indie, [employer], Hip Hop and Dance). In groups of 3-4, patients took turns flicking the spinner. Which every category, the team landed on, they had to finish the lyric to a song from that category. If they guessed the lyric correctly, the team keeps the card. There were also spaces on the spinner where a group could randomly pick a category of their choosing or steal a card from another team. The team with the most cards, wins the game.   Education:  Teacher, English As A Foreign Language, Stress Management, Discharge Planning  Education Outcome: Acknowledges education/In group clarification offered/Needs additional education   Affect/Mood: Appropriate   Participation Level: Engaged   Participation Quality: Independent   Behavior: Appropriate   Speech/Thought Process: Focused   Insight: Good   Judgement: Good   Modes of Intervention: Competitive Play   Patient Response to Interventions:  Engaged   Education Outcome:  In group clarification offered    Clinical Observations/Individualized Feedback: Pt came in half way through group. Pt jumped right in with the activity. Pt was engaged and able to get the majority of his lyrics correct. Pt was bright and social throughout group.     Plan: Continue to engage patient in RT group sessions 2-3x/week.   Porshia Blizzard-McCall,  LRT,CTRS 05/27/2023 1:11 PM

## 2023-05-27 NOTE — BHH Group Notes (Signed)
 BHH Group Notes:  (Nursing/MHT/Case Management/Adjunct)  Date:  05/27/2023  Time:  8:25 PM  Type of Therapy:   Wrap Up Group  Participation Level:  Active  Participation Quality:  Appropriate  Affect:  Appropriate  Cognitive:  Appropriate  Insight:  Appropriate and Improving  Engagement in Group:  Engaged  Modes of Intervention:  Discussion  Summary of Progress/Problems: Patient participated in group appropriately.  Michael [name] 05/27/2023, 8:25 PM

## 2023-05-27 NOTE — Progress Notes (Signed)
  Santino R Soller   Type of Note: Shelter resources  Pt was given list of shelters in the area. Pt encouraged to start calling for bed availability. Pt agreeable.  Signed:  Kylie Simmonds, LCSW-A 05/27/2023  12:04 PM

## 2023-05-27 NOTE — Plan of Care (Signed)
 ?  Problem: Activity: ?Goal: Interest or engagement in activities will improve ?Outcome: Progressing ?Goal: Sleeping patterns will improve ?Outcome: Progressing ?  ?Problem: Coping: ?Goal: Ability to verbalize frustrations and anger appropriately will improve ?Outcome: Progressing ?Goal: Ability to demonstrate self-control will improve ?Outcome: Progressing ?  ?Problem: Safety: ?Goal: Periods of time without injury will increase ?Outcome: Progressing ?  ?

## 2023-05-27 NOTE — Plan of Care (Signed)
   Problem: Safety: Goal: Periods of time without injury will increase Outcome: Progressing   Problem: Coping: Goal: Coping ability will improve Outcome: Progressing

## 2023-05-27 NOTE — Group Note (Deleted)
 Date:  05/27/2023 Time:  8:51 PM  Group Topic/Focus:  Wrap-Up Group:   The focus of this group is to help patients review their daily goal of treatment and discuss progress on daily workbooks.     Participation Level:  {BHH PARTICIPATION QMVHQ:46962}  Participation Quality:  {BHH PARTICIPATION QUALITY:22265}  Affect:  {BHH AFFECT:22266}  Cognitive:  {BHH COGNITIVE:22267}  Insight: {BHH Insight2:20797}  Engagement in Group:  {BHH ENGAGEMENT IN XBMWU:13244}  Modes of Intervention:  {BHH MODES OF INTERVENTION:22269}  Additional Comments:  ***  Dillard Frame 05/27/2023, 8:51 PM

## 2023-05-27 NOTE — BH IP Treatment Plan (Signed)
 Interdisciplinary Treatment and Diagnostic Plan Update  05/27/2023 Time of Session: 11:00 AM Michael [name] MRN: [id number]  Principal Diagnosis: MDD (major depressive disorder), recurrent severe, without psychosis (HCC)  Secondary Diagnoses: Principal Problem:   MDD (major depressive disorder), recurrent severe, without psychosis (HCC)   Current Medications:  Current Facility-Administered Medications  Medication Dose Route Frequency Provider Last Rate Last Admin   acetaminophen  (TYLENOL ) tablet 650 mg  650 mg Oral Q6H PRN Onuoha, Chinwendu V, NP   650 mg at 05/27/23 1318   alum & mag hydroxide-simeth (MAALOX/MYLANTA) 200-200-20 MG/5ML suspension 30 mL  30 mL Oral Q4H PRN Onuoha, Chinwendu V, NP       desvenlafaxine  (PRISTIQ ) 24 hr tablet 50 mg  50 mg Oral Daily Attiah, Nadir, MD       haloperidol  (HALDOL ) tablet 5 mg  5 mg Oral TID PRN Onuoha, Chinwendu V, NP       And   diphenhydrAMINE  (BENADRYL ) capsule 50 mg  50 mg Oral TID PRN Onuoha, Chinwendu V, NP       haloperidol  lactate (HALDOL ) injection 5 mg  5 mg Intramuscular TID PRN Onuoha, Chinwendu V, NP       And   diphenhydrAMINE  (BENADRYL ) injection 50 mg  50 mg Intramuscular TID PRN Onuoha, Chinwendu V, NP       And   LORazepam  (ATIVAN ) injection 2 mg  2 mg Intramuscular TID PRN Onuoha, Chinwendu V, NP       haloperidol  lactate (HALDOL ) injection 10 mg  10 mg Intramuscular TID PRN Onuoha, Chinwendu V, NP       And   diphenhydrAMINE  (BENADRYL ) injection 50 mg  50 mg Intramuscular TID PRN Onuoha, Chinwendu V, NP       And   LORazepam  (ATIVAN ) injection 2 mg  2 mg Intramuscular TID PRN Onuoha, Chinwendu V, NP       gabapentin  (NEURONTIN ) capsule 200 mg  200 mg Oral TID Nwoko, Agnes I, NP       hydrOXYzine  (ATARAX ) tablet 25 mg  25 mg Oral Q4H PRN Nwoko, Agnes I, NP       magnesium  hydroxide (MILK OF MAGNESIA) suspension 30 mL  30 mL Oral Daily PRN Onuoha, Chinwendu V, NP       nicotine  (NICODERM CQ  - dosed in mg/24 hours) patch 14  mg  14 mg Transdermal Daily Onuoha, Chinwendu V, NP   14 mg at 05/27/23 0802   QUEtiapine  (SEROQUEL ) tablet 100 mg  100 mg Oral [id number] Nwoko, Agnes I, NP       traZODone  (DESYREL ) tablet 50 mg  50 mg Oral QHS PRN Nwoko, Agnes I, NP       PTA Medications: Medications Prior to Admission  Medication Sig Dispense Refill Last Dose/Taking   traZODone  (DESYREL ) 50 MG tablet Take 1 tablet (50 mg total) by mouth at bedtime as needed for sleep. 30 tablet 0 05/27/2023   gabapentin  (NEURONTIN ) 100 MG capsule Take 2 capsules (200 mg total) by mouth 3 (three) times daily. 180 capsule 0    hydrOXYzine  (ATARAX ) 25 MG tablet Take 1 tablet (25 mg total) by mouth 3 (three) times daily as needed for anxiety. 30 tablet 0    QUEtiapine  (SEROQUEL ) 50 MG tablet Take 1 tablet (50 mg total) by mouth 2 (two) times daily and at bedtime. 90 tablet 0    venlafaxine  XR (EFFEXOR -XR) 150 MG 24 hr capsule Take 1 capsule (150 mg total) by mouth daily with breakfast. 30 capsule 0  Patient Stressors: Financial difficulties   Marital or family conflict   Medication change or noncompliance    Patient Strengths: Automotive engineer for treatment/growth   Treatment Modalities: Medication Management, Group therapy, Case management,  1 to 1 session with clinician, Psychoeducation, Recreational therapy.   Physician Treatment Plan for Primary Diagnosis: MDD (major depressive disorder), recurrent severe, without psychosis (HCC) Long Term Goal(s): Improvement in symptoms so as ready for discharge   Short Term Goals: Ability to identify changes in lifestyle to reduce recurrence of condition will improve Ability to verbalize feelings will improve Ability to disclose and discuss suicidal ideas Ability to demonstrate self-control will improve Ability to identify and develop effective coping behaviors will improve  Medication Management: Evaluate patient's response, side effects, and tolerance of medication  regimen.  Therapeutic Interventions: 1 to 1 sessions, Unit Group sessions and Medication administration.  Evaluation of Outcomes: Not Progressing  Physician Treatment Plan for Secondary Diagnosis: Principal Problem:   MDD (major depressive disorder), recurrent severe, without psychosis (HCC)  Long Term Goal(s): Improvement in symptoms so as ready for discharge   Short Term Goals: Ability to identify changes in lifestyle to reduce recurrence of condition will improve Ability to verbalize feelings will improve Ability to disclose and discuss suicidal ideas Ability to demonstrate self-control will improve Ability to identify and develop effective coping behaviors will improve     Medication Management: Evaluate patient's response, side effects, and tolerance of medication regimen.  Therapeutic Interventions: 1 to 1 sessions, Unit Group sessions and Medication administration.  Evaluation of Outcomes: Not Progressing   RN Treatment Plan for Primary Diagnosis: MDD (major depressive disorder), recurrent severe, without psychosis (HCC) Long Term Goal(s): Knowledge of disease and therapeutic regimen to maintain health will improve  Short Term Goals: Ability to remain free from injury will improve, Ability to verbalize frustration and anger appropriately will improve, Ability to demonstrate self-control, Ability to participate in decision making will improve, Ability to verbalize feelings will improve, Ability to disclose and discuss suicidal ideas, Ability to identify and develop effective coping behaviors will improve, and Compliance with prescribed medications will improve  Medication Management: RN will administer medications as ordered by provider, will assess and evaluate patient's response and provide education to patient for prescribed medication. RN will report any adverse and/or side effects to prescribing provider.  Therapeutic Interventions: 1 on 1 counseling sessions,  Psychoeducation, Medication administration, Evaluate responses to treatment, Monitor vital signs and CBGs as ordered, Perform/monitor CIWA, COWS, AIMS and Fall Risk screenings as ordered, Perform wound care treatments as ordered.  Evaluation of Outcomes: Not Progressing   LCSW Treatment Plan for Primary Diagnosis: MDD (major depressive disorder), recurrent severe, without psychosis (HCC) Long Term Goal(s): Safe transition to appropriate next level of care at discharge, Engage patient in therapeutic group addressing interpersonal concerns.  Short Term Goals: Engage patient in aftercare planning with referrals and resources, Increase social support, Increase ability to appropriately verbalize feelings, Increase emotional regulation, Facilitate acceptance of mental health diagnosis and concerns, Facilitate patient progression through stages of change regarding substance use diagnoses and concerns, Identify triggers associated with mental health/substance abuse issues, and Increase skills for wellness and recovery  Therapeutic Interventions: Assess for all discharge needs, 1 to 1 time with Social worker, Explore available resources and support systems, Assess for adequacy in community support network, Educate family and significant other(s) on suicide prevention, Complete Psychosocial Assessment, Interpersonal group therapy.  Evaluation of Outcomes: Not Progressing   Progress in Treatment: Attending groups:  Yes. Participating in groups: Yes. Taking medication as prescribed: Yes. Toleration medication: Yes. Family/Significant other contact made: Yes, individual(s) contacted:  Wife, Michael [name] [phone] Patient understands diagnosis: Yes. Discussing patient identified problems/goals with staff: Yes. Medical problems stabilized or resolved: Yes. Denies suicidal/homicidal ideation: Yes. Issues/concerns per patient self-inventory: No.  New problem(s) identified:  No  New Short Term/Long  Term Goal(s):    medication stabilization, elimination of SI thoughts, development of comprehensive mental wellness plan.   Patient Goals:  "I want to get my medicine adjusted."   Discharge Plan or Barriers:  Patient recently admitted. CSW will continue to follow and assess for appropriate referrals and possible discharge planning.   Reason for Continuation of Hospitalization: Anxiety Depression Medication stabilization Suicidal ideation  Estimated Length of Stay:  5 - 7 days  Last 3 [place] Suicide Severity Risk Score: Flowsheet Row [facility] Most recent reading at 05/27/2023  2:12 AM ED from 05/26/2023 in [facility] Most recent reading at 05/26/2023  3:13 AM Ad[facility] (Discharged) [facility] Most recent reading at 03/28/2023  8:00 AM  C-SSRS RISK CATEGORY Low Risk Low Risk Low Risk       Last PHQ 2/9 Scores:    05/26/2023   12:30 PM 03/20/2023    9:16 AM 03/13/2023   12:27 PM  Depression screen PHQ 2/9  Decreased Interest 2 2 2   Down, Depressed, Hopeless 2 2 2   PHQ - 2 Score 4 4 4   Altered sleeping 1  1  Tired, decreased energy 2  1  Change in appetite 1  1  Feeling bad or failure about yourself  2  2  Trouble concentrating 1  1  Moving slowly or fidgety/restless 1  2  Suicidal thoughts 3  1  PHQ-9 Score 15  13  Difficult doing work/chores   Very difficult    Scribe for Treatment Team: Sybil Shrader O Jermiah Soderman, LCSWA 05/27/2023 3:45 PM
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
## 2023-05-28 DIAGNOSIS — F332 Major depressive disorder, recurrent severe without psychotic features: Secondary | ICD-10-CM | POA: Diagnosis not present

## 2023-05-28 LAB — RPR: RPR Ser Ql: NONREACTIVE

## 2023-05-28 MED ORDER — QUETIAPINE FUMARATE 100 MG PO TABS
100.0000 mg | ORAL_TABLET | Freq: Every day | ORAL | Status: DC
  Administered 2023-05-29 – 2023-05-30 (×2): 100 mg via ORAL
  Filled 2023-05-28: qty 1
  Filled 2023-05-28: qty 7
  Filled 2023-05-28 (×2): qty 1

## 2023-05-28 NOTE — Plan of Care (Signed)
  Problem: Education: Goal: Emotional status will improve Outcome: Progressing Goal: Mental status will improve Outcome: Progressing   Problem: Safety: Goal: Periods of time without injury will increase Outcome: Progressing   Problem: Coping: Goal: Ability to demonstrate self-control will improve Outcome: Progressing

## 2023-05-28 NOTE — Progress Notes (Signed)
   05/28/23 2205  Psych Admission Type (Psych Patients Only)  Admission Status Voluntary  Psychosocial Assessment  Patient Complaints None  Eye Contact Fair  Facial Expression Animated  Affect Appropriate to circumstance  Speech Logical/coherent  Interaction Assertive  Motor Activity Other (Comment) (WDL)  Appearance/Hygiene Unremarkable  Behavior Characteristics Appropriate to situation  Mood Pleasant  Thought Process  Coherency WDL  Content WDL  Delusions None reported or observed  Perception WDL  Hallucination None reported or observed  Judgment Limited  Confusion None  Danger to Self  Current suicidal ideation? Denies  Self-Injurious Behavior No self-injurious ideation or behavior indicators observed or expressed   Agreement Not to Harm Self Yes  Description of Agreement verbal  Danger to Others  Danger to Others None reported or observed

## 2023-05-28 NOTE — Plan of Care (Signed)
   Problem: Education: Goal: Knowledge of [facility] Education information/materials will improve Outcome: Progressing Goal: Emotional status will improve Outcome: Progressing Goal: Mental status will improve Outcome: Progressing Goal: Verbalization of understanding the information provided will improve Outcome: Progressing

## 2023-05-28 NOTE — Progress Notes (Signed)
 Patient reports 5/10 for anxiety and 6/10 for depression. He denies SI/HI/AVH this morning  05/28/23 0800  Psych Admission Type (Psych Patients Only)  Admission Status Voluntary  Psychosocial Assessment  Patient Complaints Anxiety;Depression (Patient rates anxiety 5/10 and depression 6/10)  Eye Contact Fair  Facial Expression Anxious  Affect Appropriate to circumstance  Speech Logical/coherent  Interaction Assertive  Motor Activity Other (Comment) (WNL)  Appearance/Hygiene Unremarkable  Behavior Characteristics Appropriate to situation;Cooperative  Mood Anxious;Depressed  Thought Process  Content WDL  Delusions None reported or observed  Perception WDL  Hallucination None reported or observed  Judgment Limited  Confusion None  Danger to Self  Current suicidal ideation? Denies  Description of Suicide Plan no plane  Self-Injurious Behavior No self-injurious ideation or behavior indicators observed or expressed   Agreement Not to Harm Self Yes  Description of Agreement verbal

## 2023-05-28 NOTE — Group Note (Signed)
  LCSW Group Therapy Note  Group Date: 05/28/2023 Start Time: 10:00am End Time: 11:10am  Group Therapy:   Anger Management Skills  Participation Level:  Minimal  Description of Group:   In this group, patients learned how to recognize the physical, cognitive, emotional, and behavioral responses they have to anger-provoking situations.  They identified a recent time they became angry and how they reacted.  They analyzed how their reaction was possibly beneficial and how it was possibly unhelpful.  The group discussed a variety of healthier coping skills that could help with such a situation in the future.  Focus was placed on how helpful it is to recognize the underlying emotions to our anger, because working on those can lead to a more permanent solution as well as our ability to focus on the important rather than the urgent.  The group explored the issue of boundaries and long-term resentments as related to their anger.  CSW taught briefly about states of mind and reviewed TIPP skills, practicing a couple of these with the group as a whole.   Therapeutic Goals: Patients will identify someone/something that typically triggers them to anger as well as their typical coping skill, whether healthy or unhealthy. Patients will learn that anger is a secondary emotion and will see how working on the primary emotion is beneficial in the long-term to deal with angry reactions. Patients will learn basic CBT concept of Thought-Feeling-Action sequence and how helpful it is to challenge the thought in order to have a different feeling and behavior. Patients will be introduced to DBT concept of Emotional/Rational/Wise Mind and how to use TIPP skills to rapidly exit emotional state. Patients will explore possible new behaviors to use in future anger situations.   Summary of Patient Progress:  Patient was active during the group. Patient was not present during the first half or more of the group.  Once he  arrived, he demonstrated some insight into the subject matter, was respectful of peers, and participated minimally although he was attentive.  At the conclusion of group, patient shared a willingness to try relaxation techniques in the future to improve his anger management.  Therapeutic Modalities:   Cognitive Behavioral Therapy Dialectical Behavioral Therapy Processing  Michael Kass, LCSW 05/28/2023  12:54 PM

## 2023-05-28 NOTE — Progress Notes (Signed)
 [facility] MD Progress Note  05/28/2023 2:19 PM Michael [name]  MRN:  [id number]  Principal Problem: MDD (major depressive disorder), recurrent severe, without psychosis (HCC) Diagnosis: Principal Problem:   MDD (major depressive disorder), recurrent severe, without psychosis (HCC)   Reason for Admission:  Michael [name] is a 48 y.o.male with a history of major depressive disorder, generalized anxiety disorder with panic attacks who was admitted to [facility] due to worsening depression and SI.  (admitted on 05/26/2023, total  LOS: 2 days )  Chart Review from last 24 hours:  The patient's chart was reviewed and nursing notes were reviewed. The patient's case was discussed in multidisciplinary team meeting.   - Overnight events to report per chart review / staff report: no notable overnight events to report - Patient received all scheduled medications - Patient received the following PRN medications: trazodone   Information Obtained Today During Patient Interview: The patient was seen and evaluated on the unit. On assessment today the patient reports continuing to feel depressed and anxious.  He denies SI/HI/AVH but feels he will need additional support on discharge.  He reports that a lot of the stress was related to returning to work but also he needs to make money to be able to afford his bills and housing.  He reports sleep was fair and appetite was fair.  He denies acute somatic complaints from current medication regimen. Patient is attending groups.  Past Psychiatric History: see H&P Past Medical History:  Past Medical History:  Diagnosis Date   Anxiety    Depression    Panic attack    TGA (transient global amnesia)    TIA (transient ischemic attack)     Family Psychiatric History: see H&P Social History: see H&P  Current Medications: Current Facility-Administered Medications  Medication Dose Route Frequency Provider Last Rate Last Admin   acetaminophen  (TYLENOL ) tablet 650 mg  650 mg  Oral Q6H PRN Onuoha, Chinwendu V, NP   650 mg at 05/27/23 1318   alum & mag hydroxide-simeth (MAALOX/MYLANTA) 200-200-20 MG/5ML suspension 30 mL  30 mL Oral Q4H PRN Onuoha, Chinwendu V, NP       desvenlafaxine  (PRISTIQ ) 24 hr tablet 50 mg  50 mg Oral Daily Attiah, Nadir, MD   50 mg at 05/28/23 0759   haloperidol  (HALDOL ) tablet 5 mg  5 mg Oral TID PRN Onuoha, Chinwendu V, NP       And   diphenhydrAMINE  (BENADRYL ) capsule 50 mg  50 mg Oral TID PRN Onuoha, Chinwendu V, NP       haloperidol  lactate (HALDOL ) injection 5 mg  5 mg Intramuscular TID PRN Onuoha, Chinwendu V, NP       And   diphenhydrAMINE  (BENADRYL ) injection 50 mg  50 mg Intramuscular TID PRN Onuoha, Chinwendu V, NP       And   LORazepam  (ATIVAN ) injection 2 mg  2 mg Intramuscular TID PRN Onuoha, Chinwendu V, NP       haloperidol  lactate (HALDOL ) injection 10 mg  10 mg Intramuscular TID PRN Onuoha, Chinwendu V, NP       And   diphenhydrAMINE  (BENADRYL ) injection 50 mg  50 mg Intramuscular TID PRN Onuoha, Chinwendu V, NP       And   LORazepam  (ATIVAN ) injection 2 mg  2 mg Intramuscular TID PRN Onuoha, Chinwendu V, NP       gabapentin  (NEURONTIN ) capsule 200 mg  200 mg Oral TID Nwoko, Agnes I, NP   200 mg at 05/28/23 1210  hydrOXYzine  (ATARAX ) tablet 25 mg  25 mg Oral Q4H PRN Asuncion Layer I, NP   25 mg at 05/28/23 1044   magnesium  hydroxide (MILK OF MAGNESIA) suspension 30 mL  30 mL Oral Daily PRN Onuoha, Chinwendu V, NP       nicotine  (NICODERM CQ  - dosed in mg/24 hours) patch 14 mg  14 mg Transdermal Daily Onuoha, Chinwendu V, NP   14 mg at 05/28/23 0759   QUEtiapine  (SEROQUEL ) tablet 100 mg  100 mg Oral [id number] Nwoko, Devra Fontana I, NP   100 mg at 05/28/23 0759   traZODone  (DESYREL ) tablet 50 mg  50 mg Oral QHS PRN Asuncion Layer I, NP   50 mg at 05/27/23 2130    Lab Results:  Results for orders placed or performed during the hospital encounter of 05/26/23 (from the past 48 hours)  Folate     Status: None   Collection Time: 05/27/23   6:30 PM  Result Value Ref Range   Folate 9.9 >5.9 ng/mL    Comment: Performed at [facility], [address] [place], [place] [address]  RPR     Status: None   Collection Time: 05/27/23  6:30 PM  Result Value Ref Range   RPR Ser Ql NON REACTIVE NON REACTIVE    Comment: Performed at [facility], [address] [place], [place] [address]  TSH     Status: None   Collection Time: 05/27/23  6:30 PM  Result Value Ref Range   TSH 2.185 0.350 - 4.500 uIU/mL    Comment: Performed by a 3rd Generation assay with a functional sensitivity of <=0.01 uIU/mL. Performed at [facility], [address] [place], [place] [address]   Vitamin B12     Status: Abnormal   Collection Time: 05/27/23  6:30 PM  Result Value Ref Range   Vitamin B-12 173 (L) 180 - 914 pg/mL    Comment: (NOTE) This assay is not validated for testing neonatal or myeloproliferative syndrome specimens for Vitamin B12 levels. Performed at [facility], [address] [place], [place] [address]   VITAMIN D  25 Hydroxy (Vit-D Deficiency, Fractures)     Status: None   Collection Time: 05/27/23  6:30 PM  Result Value Ref Range   Vit D, 25-Hydroxy 35.74 30 - 100 ng/mL    Comment: (NOTE) Vitamin D  deficiency has been defined by the Institute of Medicine  and an Endocrine Society practice guideline as a level of serum 25-OH  vitamin D  less than 20 ng/mL (1,2). The Endocrine Society went on to  further define vitamin D  insufficiency as a level between 21 and 29  ng/mL (2).  1. IOM (Institute of Medicine). 2010. Dietary reference intakes for  calcium and D. [place]: [employer]. 2. Holick MF, Binkley Morral, Bischoff-Ferrari HA, et al. Evaluation,  treatment, and prevention of vitamin D  deficiency: an Endocrine  Society clinical practice guideline, JCEM. 2011 Jul; 96(7): 1911-30.  Performed at [facility], [address], [place],  [place] [address]   Basic metabolic panel     Status: Abnormal   Collection Time: 05/27/23  6:30 PM  Result Value Ref Range   Sodium 138 135 - 145 mmol/L   Potassium 3.8 3.5 - 5.1 mmol/L   Chloride 103 98 - 111 mmol/L   CO2 25 22 - 32 mmol/L   Glucose, Bld 109 (H) 70 - 99 mg/dL    Comment: Glucose reference range applies  only to samples taken after fasting for at least 8 hours.   BUN 13 6 - 20 mg/dL   Creatinine, Ser 9.60 0.61 - 1.24 mg/dL   Calcium 8.8 (L) 8.9 - 10.3 mg/dL   GFR, Estimated >45 >40 mL/min    Comment: (NOTE) Calculated using the CKD-EPI Creatinine Equation (2021)    Anion gap 10 5 - 15    Comment: Performed at [facility], [address], [place], [place] [address]    Blood Alcohol level:  Lab Results  Component Value Date   ETH <10 05/26/2023   ETH 14 (H) 03/28/2023    Metabolic Labs: Lab Results  Component Value Date   HGBA1C 5.2 03/30/2023   MPG 102.54 03/30/2023   No results found for: "PROLACTIN" Lab Results  Component Value Date   CHOL 254 (H) 03/30/2023   TRIG 144 03/30/2023   HDL 65 03/30/2023   CHOLHDL 3.9 03/30/2023   VLDL 29 03/30/2023   LDLCALC 160 (H) 03/30/2023    Physical Findings: AIMS: No  CIWA:    COWS:     Psychiatric Specialty Exam: General Appearance: Casual; Fairly Groomed   Eye Contact: Good   Speech: Clear and Coherent; Normal Rate   Volume: Normal   Mood: Anxious; Depressed; Hopeless   Affect: Congruent   Thought Content: Logical   Suicidal Thoughts: Suicidal Thoughts: Yes, Passive SI Passive Intent and/or Plan: Without Intent; Without Plan; Without Means to Carry Out; Without Access to Means   Homicidal Thoughts: Homicidal Thoughts: No   Thought Process: Coherent; Goal Directed; Linear   Orientation: Full (Time, Place and Person)     Memory: Immediate Good; Recent Good; Remote Good   Judgment: Fair   Insight: Fair   Concentration: Good   Recall: Good   Fund of Knowledge: Fair    Language: Good   Psychomotor Activity: Psychomotor Activity: Normal   Assets: Communication Skills; Desire for Improvement; Physical Health; Resilience; Social Support   Sleep: Sleep: Poor Number of Hours of Sleep: 4    Review of Systems ROS  Vital Signs: Blood pressure (!) 142/80, pulse 99, temperature 97.9 F (36.6 C), temperature source Oral, resp. rate 16, height 6\' 3"  (1.905 m), weight 86.2 kg, SpO2 98%. Body mass index is 23.75 kg/m. Physical Exam  Assets  Assets: Communication Skills; Desire for Improvement; Physical Health; Resilience; Social Support   Treatment Plan Summary: Daily contact with patient to assess and evaluate symptoms and progress in treatment and Medication management  Diagnoses / Active Problems: MDD (major depressive disorder), recurrent severe, without psychosis (HCC) Principal Problem:   MDD (major depressive disorder), recurrent severe, without psychosis (HCC)   ASSESSMENT: Patient presents depressed and anxious today.  No medication adjustments were made today given he was restarted on multiple of his medications and he had not been taking them for some time.  Unclear why he was on 100 mg of quetiapine  3 times daily but noted H&P plan for patient to be on 100 mg nightly.  PLAN: Safety and Monitoring:  -- Voluntary admission to inpatient psychiatric unit for safety, stabilization and treatment  -- Daily contact with patient to assess and evaluate symptoms and progress in treatment  -- Patient's case to be discussed in multi-disciplinary team meeting  -- Observation Level : q15 minute checks  -- Vital signs:  q12 hours  -- Precautions: suicide, elopement, and assault  2. Interventions (medications, psychoeducation, etc):   - Continue Pristiq  50 mg po daily for depression. - Continue on  Seroquel  at 100 mg po Q bedtime for mood stabilization.  - Continue on Trazodone  50 mg po q hs prn for insomnia.  - Continue on Gabapentin  at 200 mg po  tid for anxiety. -Continue Nicotine  patch 14 mg po Q 24 hours for nicotine  withdrawal.   --Haldol  5 mg, oral, 3 times daily as needed, mild agitation --Benadryl  50 mg, oral, 3 times daily as needed, mild agitation                                     OR --Haldol  injection 5 mg, IM, 3 times daily as needed, moderate agitation --Benadryl  injection 50 mg, IM, 3 times daily as needed, moderate agitation --Ativan  injection 2 mg, IM, 3 times daily as needed, moderate agitation                                     OR --Haldol  injection 10 mg, IM, 3 times daily as needed, severe agitation --Benadryl  injection 50 mg, IM, 3 times daily as needed, severe agitation --Ativan  injection 2 mg, IM, 3 times daily as needed, severe agitation   Other PRNS -Continue Tylenol  650 mg every 6 hours PRN for mild pain -Continue Maalox 30 ml Q 4 hrs PRN for indigestion -Continue MOM 30 ml po Q 6 hrs for constipation  PRN medications for symptomatic management:              -- continue acetaminophen  650 mg every 6 hours as needed for mild to moderate pain, fever, and headaches              -- continue hydroxyzine  25 mg three times a day as needed for anxiety              -- continue bismuth subsalicylate 524 mg oral chewable tablet every 3 hours as needed for indigestion              -- continue senna 8.6 mg oral at bedtime as needed and polyethylene glycol 17 g oral daily as needed for mild to moderate constipation              -- continue ondansetron  8 mg every 8 hours as needed for nausea or vomiting              -- continue aluminum-magnesium  hydroxide + simethicone 30 mL every 4 hours as needed for heartburn  -- As needed agitation protocol in-place  The risks/benefits/side-effects/alternatives to the above medication were discussed in detail with the patient and time was given for questions. The patient consents to medication trial. FDA black box warnings, if present, were discussed.  The patient is agreeable  with the medication plan, as above. We will monitor the patient's response to pharmacologic treatment, and adjust medications as necessary.  3. Routine and other pertinent labs:             -- Metabolic profile:  BMI: Body mass index is 23.75 kg/m.  Prolactin: No results found for: "PROLACTIN"  Lipid Panel: Lab Results  Component Value Date   CHOL 254 (H) 03/30/2023   TRIG 144 03/30/2023   HDL 65 03/30/2023   CHOLHDL 3.9 03/30/2023   VLDL 29 03/30/2023   LDLCALC 160 (H) 03/30/2023    HbgA1c: Hgb A1c MFr Bld (%)  Date Value  03/30/2023 5.2  TSH: TSH (uIU/mL)  Date Value  05/27/2023 2.185    EKG monitoring: QTc: 454  4. Group Therapy:  -- Encouraged patient to participate in unit milieu and in scheduled group therapies   -- Short Term Goals: Ability to identify changes in lifestyle to reduce recurrence of condition, verbalize feelings, identify and develop effective coping behaviors, maintain clinical measurements within normal limits, and identify triggers associated with substance abuse/mental health issues will improve. Improvement in ability to demonstrate self-control and comply with prescribed medications.  -- Long Term Goals: Improvement in symptoms so as ready for discharge -- Patient is encouraged to participate in group therapy while admitted to the psychiatric unit. -- We will address other chronic and acute stressors, which contributed to the patient's MDD (major depressive disorder), recurrent severe, without psychosis (HCC) in order to reduce the risk of self-harm at discharge.  5. Discharge Planning:   -- Social work and case management to assist with discharge planning and identification of hospital follow-up needs prior to discharge  -- Estimated LOS: 5 days  -- Discharge Concerns: Need to establish a safety plan; Medication compliance and effectiveness  -- Discharge Goals: Return home with outpatient referrals for mental health follow-up including  medication management/psychotherapy  I certify that inpatient services furnished can reasonably be expected to improve the patient's condition.   Signed: Augusta Blizzard, MD 05/28/2023, 2:19 PM

## 2023-05-28 NOTE — Progress Notes (Signed)
   05/28/23 0500  Psych Admission Type (Psych Patients Only)  Admission Status Voluntary  Psychosocial Assessment  Patient Complaints Anxiety;Depression  Eye Contact Fair  Facial Expression Anxious  Affect Anxious  Speech Logical/coherent  Interaction Assertive  Motor Activity Other (Comment) (WNL)  Appearance/Hygiene Unremarkable  Behavior Characteristics Cooperative  Mood Depressed;Pleasant  Thought Process  Coherency WDL  Content WDL  Delusions None reported or observed  Perception WDL  Hallucination None reported or observed  Judgment Impaired  Confusion None  Danger to Self  Current suicidal ideation? Denies  Description of Suicide Plan None  Self-Injurious Behavior No self-injurious ideation or behavior indicators observed or expressed   Agreement Not to Harm Self Yes  Description of Agreement Verbal contract for safety  Danger to Others  Danger to Others None reported or observed

## 2023-05-29 DIAGNOSIS — F332 Major depressive disorder, recurrent severe without psychotic features: Secondary | ICD-10-CM | POA: Diagnosis not present

## 2023-05-29 MED ORDER — GABAPENTIN 300 MG PO CAPS
300.0000 mg | ORAL_CAPSULE | Freq: Three times a day (TID) | ORAL | Status: DC
  Administered 2023-05-29 – 2023-05-31 (×6): 300 mg via ORAL
  Filled 2023-05-29 (×6): qty 1
  Filled 2023-05-29: qty 21
  Filled 2023-05-29 (×2): qty 1
  Filled 2023-05-29 (×2): qty 21

## 2023-05-29 MED ORDER — DESVENLAFAXINE SUCCINATE ER 50 MG PO TB24
100.0000 mg | ORAL_TABLET | Freq: Every day | ORAL | Status: DC
  Administered 2023-05-30 – 2023-05-31 (×2): 100 mg via ORAL
  Filled 2023-05-29: qty 1
  Filled 2023-05-29: qty 14
  Filled 2023-05-29 (×2): qty 1

## 2023-05-29 NOTE — Group Note (Signed)
 Date:  05/29/2023 Time:  9:21 AM  Group Topic/Focus:  Goals Group:   The focus of this group is to help patients establish daily goals to achieve during treatment and discuss how the patient can incorporate goal setting into their daily lives to aide in recovery. Orientation:   The focus of this group is to educate the patient on the purpose and policies of crisis stabilization and provide a format to answer questions about their admission.  The group details unit policies and expectations of patients while admitted.    Participation Level:  Did Not Attend

## 2023-05-29 NOTE — Group Note (Signed)
  LCSW Group Therapy Note  Group Date: 05/29/2023  Start Time: 10:00am End Time: 11:10am  Group Therapy:   Boundaries  Participation Level:  Active  Description of Group: This group was used to explore boundary styles (porous, rigid, healthy), boundary types (physical, emotional, intellectual, sexual, material, time), basics about setting boundaries, possible phrases to use, and guidelines of how to approach someone by planning ahead, being respectful, using confident body language, and compromising.  Handouts were used to further reinforce teaching and to remind patients after hospitalization.  Patients identified current problems with setting boundaries in their own lives and discussed what they have tried to date.  Patients were encouraged to ask questions, express concerns, and bring up specific situations for group discussion.  Humor and role play were used to further healthy discussion on the topic.  Therapeutic Goals:  1.  Patient will identify one areas in their life where setting clear boundaries have been needed but unsuccessful to date.   2.  Patient will learn about boundary styles, boundary types, and setting boundaries. 3.  Patient will be provided with multiple illustrations and examples of healthy boundary setting and how to make corrections when boundaries are violated 4.  Patient will demonstrate ability to recognize the need for boundaries and make a plan on how to set boundaries in vignettes provided.  Summary of Patient Progress:   Patient demonstrated good insight into the subject matter of boundaries, was respectful of peers, and was attentive.  He shared that he does not have difficulty with setting boundaries with people, just tells people where his boundaries are.   Therapeutic Modalities:   Psychoeducation Solution-focused Processing   Ancel Kass, LCSW 05/29/2023  12:06 PM

## 2023-05-29 NOTE — Progress Notes (Signed)
 Adult Psychoeducational Group Note  Date:  05/29/2023 Time:  12:23 AM  Group Topic/Focus:  Wrap-Up Group:   The focus of this group is to help patients review their daily goal of treatment and discuss progress on daily workbooks.  Participation Level:  Active  Participation Quality:  Appropriate  Affect:  Appropriate  Cognitive:  Appropriate  Insight: Appropriate  Engagement in Group:  Engaged  Modes of Intervention:  Discussion  Additional Comments:  Pt stated he had a good day.  Pt goal for the day was to talk to his daughter.  Pt met goal.  Michael [name] 05/29/2023, 12:23 AM

## 2023-05-29 NOTE — Progress Notes (Signed)
   05/29/23 0545  15 Minute Checks  Location Bedroom  Visual Appearance Calm  Behavior Sleeping  Sleep (Behavioral Health Patients Only)  Calculate sleep? (Click Yes once per 24 hr at 0600 safety check) Yes  Documented sleep last 24 hours 8.25

## 2023-05-29 NOTE — Progress Notes (Addendum)
 [facility] MD Progress Note  05/29/2023 12:56 PM Michael [name]  MRN:  [id number]  Principal Problem: MDD (major depressive disorder), recurrent severe, without psychosis (HCC) Diagnosis: Principal Problem:   MDD (major depressive disorder), recurrent severe, without psychosis (HCC) Active Problems:   GAD (generalized anxiety disorder)   Panic disorder   Reason for Admission:  Michael [name] is a 48 y.o.male with a history of major depressive disorder, generalized anxiety disorder with panic attacks who was admitted to [facility] due to worsening depression and SI.  (admitted on 05/26/2023, total  LOS: 3 days )  Chart Review from last 24 hours:  The patient's chart was reviewed and nursing notes were reviewed. The patient's case was discussed in multidisciplinary team meeting.   - Overnight events to report per chart review / staff report: no notable overnight events to report - Patient received all scheduled medications - Patient received the following PRN medications: trazodone   Information Obtained Today During Patient Interview: The patient was seen and evaluated on the unit. On assessment today the patient reports continuing to feel depressed and anxious but has improved compared to yesterday.  He is uncertain why but feels that the Pristiq  might be working. He denies SI/HI/AVH. Patient is attending groups.  Reports sleep and appetite have been appropriate.  Past Psychiatric History: see H&P Past Medical History:  Past Medical History:  Diagnosis Date   Anxiety    Depression    Panic attack    TGA (transient global amnesia)    TIA (transient ischemic attack)     Family Psychiatric History: see H&P Social History: see H&P  Current Medications: Current Facility-Administered Medications  Medication Dose Route Frequency Provider Last Rate Last Admin   acetaminophen  (TYLENOL ) tablet 650 mg  650 mg Oral Q6H PRN Onuoha, Chinwendu V, NP   650 mg at 05/27/23 1318   alum & mag  hydroxide-simeth (MAALOX/MYLANTA) 200-200-20 MG/5ML suspension 30 mL  30 mL Oral Q4H PRN Onuoha, Chinwendu V, NP       desvenlafaxine  (PRISTIQ ) 24 hr tablet 50 mg  50 mg Oral Daily Attiah, Nadir, MD   50 mg at 05/29/23 0759   haloperidol  (HALDOL ) tablet 5 mg  5 mg Oral TID PRN Onuoha, Chinwendu V, NP       And   diphenhydrAMINE  (BENADRYL ) capsule 50 mg  50 mg Oral TID PRN Onuoha, Chinwendu V, NP       haloperidol  lactate (HALDOL ) injection 5 mg  5 mg Intramuscular TID PRN Onuoha, Chinwendu V, NP       And   diphenhydrAMINE  (BENADRYL ) injection 50 mg  50 mg Intramuscular TID PRN Onuoha, Chinwendu V, NP       And   LORazepam  (ATIVAN ) injection 2 mg  2 mg Intramuscular TID PRN Onuoha, Chinwendu V, NP       haloperidol  lactate (HALDOL ) injection 10 mg  10 mg Intramuscular TID PRN Onuoha, Chinwendu V, NP       And   diphenhydrAMINE  (BENADRYL ) injection 50 mg  50 mg Intramuscular TID PRN Onuoha, Chinwendu V, NP       And   LORazepam  (ATIVAN ) injection 2 mg  2 mg Intramuscular TID PRN Onuoha, Chinwendu V, NP       gabapentin  (NEURONTIN ) capsule 200 mg  200 mg Oral TID Nwoko, Agnes I, NP   200 mg at 05/29/23 1207   hydrOXYzine  (ATARAX ) tablet 25 mg  25 mg Oral Q4H PRN Asuncion Layer I, NP   25 mg at 05/29/23 1212  magnesium  hydroxide (MILK OF MAGNESIA) suspension 30 mL  30 mL Oral Daily PRN Onuoha, Chinwendu V, NP       nicotine  (NICODERM CQ  - dosed in mg/24 hours) patch 14 mg  14 mg Transdermal Daily Onuoha, Chinwendu V, NP   14 mg at 05/29/23 0758   QUEtiapine  (SEROQUEL ) tablet 100 mg  100 mg Oral QHS Zimal Weisensel, MD       traZODone  (DESYREL ) tablet 50 mg  50 mg Oral QHS PRN Asuncion Layer I, NP   50 mg at 05/28/23 2110    Lab Results:  Results for orders placed or performed during the hospital encounter of 05/26/23 (from the past 48 hours)  Folate     Status: None   Collection Time: 05/27/23  6:30 PM  Result Value Ref Range   Folate 9.9 >5.9 ng/mL    Comment: Performed at [facility], [address] [place], [place] [address]  RPR     Status: None   Collection Time: 05/27/23  6:30 PM  Result Value Ref Range   RPR Ser Ql NON REACTIVE NON REACTIVE    Comment: Performed at [facility], [address], [place], [place] [address]  TSH     Status: None   Collection Time: 05/27/23  6:30 PM  Result Value Ref Range   TSH 2.185 0.350 - 4.500 uIU/mL    Comment: Performed by a 3rd Generation assay with a functional sensitivity of <=0.01 uIU/mL. Performed at [facility], [address], [place], [place] [address]   Vitamin B12     Status: Abnormal   Collection Time: 05/27/23  6:30 PM  Result Value Ref Range   Vitamin B-12 173 (L) 180 - 914 pg/mL    Comment: (NOTE) This assay is not validated for testing neonatal or myeloproliferative syndrome specimens for Vitamin B12 levels. Performed at [facility], [address], [place], [place] [address]   VITAMIN D  25 Hydroxy (Vit-D Deficiency, Fractures)     Status: None   Collection Time: 05/27/23  6:30 PM  Result Value Ref Range   Vit D, 25-Hydroxy 35.74 30 - 100 ng/mL    Comment: (NOTE) Vitamin D  deficiency has been defined by the Institute of Medicine  and an Endocrine Society practice guideline as a level of serum 25-OH  vitamin D  less than 20 ng/mL (1,2). The Endocrine Society went on to  further define vitamin D  insufficiency as a level between 21 and 29  ng/mL (2).  1. IOM (Institute of Medicine). 2010. Dietary reference intakes for  calcium and D. [place]: [employer]. 2. Holick MF, Binkley [place], Bischoff-Ferrari HA, et al. Evaluation,  treatment, and prevention of vitamin D  deficiency: an Endocrine  Society clinical practice guideline, JCEM. 2011 Jul; 96(7): 1911-30.  Performed at [facility], [address] [place], [place] [address]   Basic metabolic panel     Status: Abnormal   Collection Time: 05/27/23  6:30 PM  Result  Value Ref Range   Sodium 138 135 - 145 mmol/L   Potassium 3.8 3.5 - 5.1 mmol/L   Chloride 103 98 - 111 mmol/L   CO2 25 22 - 32 mmol/L   Glucose, Bld 109 (H) 70 - 99 mg/dL    Comment: Glucose reference range applies only to samples taken after fasting for at least 8 hours.   BUN 13 6 - 20 mg/dL   Creatinine, Ser 1.30 0.61 - 1.24 mg/dL  Calcium 8.8 (L) 8.9 - 10.3 mg/dL   GFR, Estimated >16 >10 mL/min    Comment: (NOTE) Calculated using the CKD-EPI Creatinine Equation (2021)    Anion gap 10 5 - 15    Comment: Performed at [facility], [address] [place], [place] [address]    Blood Alcohol level:  Lab Results  Component Value Date   ETH <10 05/26/2023   ETH 14 (H) 03/28/2023    Metabolic Labs: Lab Results  Component Value Date   HGBA1C 5.2 03/30/2023   MPG 102.54 03/30/2023   No results found for: "PROLACTIN" Lab Results  Component Value Date   CHOL 254 (H) 03/30/2023   TRIG 144 03/30/2023   HDL 65 03/30/2023   CHOLHDL 3.9 03/30/2023   VLDL 29 03/30/2023   LDLCALC 160 (H) 03/30/2023    Physical Findings: AIMS: No  CIWA:    COWS:     Psychiatric Specialty Exam: General Appearance: Casual; Fairly Groomed   Eye Contact: Good   Speech: Clear and Coherent; Normal Rate   Volume: Normal   Mood: Anxious; Depressed; Hopeless   Affect: Congruent   Thought Content: Logical   Suicidal Thoughts: No data recorded   Homicidal Thoughts: No data recorded   Thought Process: Coherent; Goal Directed; Linear   Orientation: Full (Time, Place and Person)     Memory: Immediate Good; Recent Good; Remote Good   Judgment: Fair   Insight: Fair   Concentration: Good   Recall: Good   Fund of Knowledge: Fair   Language: Good   Psychomotor Activity: No data recorded   Assets: Communication Skills; Desire for Improvement; Physical Health; Resilience; Social Support   Sleep: No data recorded    Review of Systems ROS  Vital Signs:  Blood pressure 109/87, pulse 87, temperature 97.9 F (36.6 C), temperature source Oral, resp. rate 16, height 6\' 3"  (1.905 m), weight 86.2 kg, SpO2 98%. Body mass index is 23.75 kg/m. Physical Exam  Assets  Assets: Communication Skills; Desire for Improvement; Physical Health; Resilience; Social Support   Treatment Plan Summary: Daily contact with patient to assess and evaluate symptoms and progress in treatment and Medication management  Diagnoses / Active Problems: MDD (major depressive disorder), recurrent severe, without psychosis (HCC) Principal Problem:   MDD (major depressive disorder), recurrent severe, without psychosis (HCC) Active Problems:   GAD (generalized anxiety disorder)   Panic disorder   ASSESSMENT: Patient presents depressed and anxious today.  No medication adjustments were made today given he was restarted on multiple of his medications and he had not been taking them for some time.  Unclear why he was on 100 mg of quetiapine  3 times daily but noted H&P plan for patient to be on 100 mg nightly.  PLAN: Safety and Monitoring:  -- Voluntary admission to inpatient psychiatric unit for safety, stabilization and treatment  -- Daily contact with patient to assess and evaluate symptoms and progress in treatment  -- Patient's case to be discussed in multi-disciplinary team meeting  -- Observation Level : q15 minute checks  -- Vital signs:  q12 hours  -- Precautions: suicide, elopement, and assault  2. Interventions (medications, psychoeducation, etc):   - Increase Pristiq  to 100 mg po daily for depression. - Continue on Seroquel  100 mg po Q bedtime for mood stabilization.  - Continue on Trazodone  50 mg po q hs prn for insomnia.  - Increase Gabapentin  to 300 mg po tid for anxiety. -Continue Nicotine  patch 14 mg po Q 24 hours  for nicotine  withdrawal.   --Haldol  5 mg, oral, 3 times daily as needed, mild agitation --Benadryl  50 mg, oral, 3 times daily as needed,  mild agitation                                     OR --Haldol  injection 5 mg, IM, 3 times daily as needed, moderate agitation --Benadryl  injection 50 mg, IM, 3 times daily as needed, moderate agitation --Ativan  injection 2 mg, IM, 3 times daily as needed, moderate agitation                                     OR --Haldol  injection 10 mg, IM, 3 times daily as needed, severe agitation --Benadryl  injection 50 mg, IM, 3 times daily as needed, severe agitation --Ativan  injection 2 mg, IM, 3 times daily as needed, severe agitation   Other PRNS -Continue Tylenol  650 mg every 6 hours PRN for mild pain -Continue Maalox 30 ml Q 4 hrs PRN for indigestion -Continue MOM 30 ml po Q 6 hrs for constipation  PRN medications for symptomatic management:              -- continue acetaminophen  650 mg every 6 hours as needed for mild to moderate pain, fever, and headaches              -- continue hydroxyzine  25 mg three times a day as needed for anxiety              -- continue bismuth subsalicylate 524 mg oral chewable tablet every 3 hours as needed for indigestion              -- continue senna 8.6 mg oral at bedtime as needed and polyethylene glycol 17 g oral daily as needed for mild to moderate constipation              -- continue ondansetron  8 mg every 8 hours as needed for nausea or vomiting              -- continue aluminum-magnesium  hydroxide + simethicone 30 mL every 4 hours as needed for heartburn  -- As needed agitation protocol in-place  The risks/benefits/side-effects/alternatives to the above medication were discussed in detail with the patient and time was given for questions. The patient consents to medication trial. FDA black box warnings, if present, were discussed.  The patient is agreeable with the medication plan, as above. We will monitor the patient's response to pharmacologic treatment, and adjust medications as necessary.  3. Routine and other pertinent labs:             --  Metabolic profile:  BMI: Body mass index is 23.75 kg/m.  Prolactin: No results found for: "PROLACTIN"  Lipid Panel: Lab Results  Component Value Date   CHOL 254 (H) 03/30/2023   TRIG 144 03/30/2023   HDL 65 03/30/2023   CHOLHDL 3.9 03/30/2023   VLDL 29 03/30/2023   LDLCALC 160 (H) 03/30/2023    HbgA1c: Hgb A1c MFr Bld (%)  Date Value  03/30/2023 5.2    TSH: TSH (uIU/mL)  Date Value  05/27/2023 2.185    EKG monitoring: QTc: 454  4. Group Therapy:  -- Encouraged patient to participate in unit milieu and in scheduled group therapies   -- Short Term Goals: Ability to identify changes  in lifestyle to reduce recurrence of condition, verbalize feelings, identify and develop effective coping behaviors, maintain clinical measurements within normal limits, and identify triggers associated with substance abuse/mental health issues will improve. Improvement in ability to demonstrate self-control and comply with prescribed medications.  -- Long Term Goals: Improvement in symptoms so as ready for discharge -- Patient is encouraged to participate in group therapy while admitted to the psychiatric unit. -- We will address other chronic and acute stressors, which contributed to the patient's MDD (major depressive disorder), recurrent severe, without psychosis (HCC) in order to reduce the risk of self-harm at discharge.  5. Discharge Planning:   -- Social work and case management to assist with discharge planning and identification of hospital follow-up needs prior to discharge  -- Estimated LOS: 5 days  -- Discharge Concerns: Need to establish a safety plan; Medication compliance and effectiveness  -- Discharge Goals: Return home with outpatient referrals for mental health follow-up including medication management/psychotherapy  I certify that inpatient services furnished can reasonably be expected to improve the patient's condition.   Signed: Augusta Blizzard, MD 05/29/2023, 12:56 PM

## 2023-05-29 NOTE — Progress Notes (Signed)
   05/29/23 2214  Psych Admission Type (Psych Patients Only)  Admission Status Voluntary  Psychosocial Assessment  Patient Complaints Anxiety  Eye Contact Fair  Facial Expression Animated  Affect Appropriate to circumstance  Speech Logical/coherent  Interaction Assertive  Motor Activity Other (Comment) (WDL)  Appearance/Hygiene Unremarkable  Behavior Characteristics Appropriate to situation  Mood Anxious;Pleasant  Thought Process  Coherency WDL  Content WDL  Delusions None reported or observed  Perception WDL  Hallucination None reported or observed  Judgment Limited  Confusion None  Danger to Self  Current suicidal ideation? Denies  Self-Injurious Behavior No self-injurious ideation or behavior indicators observed or expressed   Agreement Not to Harm Self Yes  Description of Agreement verbal  Danger to Others  Danger to Others None reported or observed

## 2023-05-29 NOTE — Plan of Care (Signed)
 Nurse discussed anxiety, depression and coping skills with patient.

## 2023-05-29 NOTE — Progress Notes (Signed)
 D:  Patient's self inventory sheet, patient sleeps good, no sleep medication given.  Good appetite, normal energy level.  Rated depression 3, hopeless 4, anxiety 5.  Denied withdrawals.  Denied physical problems.  Denied physical pain.  Goal is find place to stay after discharge.  Plans to make phone calls.  No discharge plans. A:  Medications administered per MD orders.  Emotional support and encouragement given patient. R:  Denied SI and HI, contracts for safety.  Denied A/V hallucinations.  Safety maintained with 15 minute checks.

## 2023-05-29 NOTE — Plan of Care (Signed)
   Problem: Education: Goal: Emotional status will improve Outcome: Progressing   Problem: Activity: Goal: Interest or engagement in activities will improve Outcome: Progressing

## 2023-05-30 DIAGNOSIS — F332 Major depressive disorder, recurrent severe without psychotic features: Secondary | ICD-10-CM | POA: Diagnosis not present

## 2023-05-30 NOTE — Plan of Care (Signed)
  Problem: Activity: Goal: Interest or engagement in activities will improve Outcome: Progressing Goal: Sleeping patterns will improve Outcome: Progressing   Problem: Coping: Goal: Ability to verbalize frustrations and anger appropriately will improve Outcome: Progressing Goal: Ability to demonstrate self-control will improve Outcome: Progressing   Problem: Activity: Goal: Interest or engagement in activities will improve Outcome: Progressing Goal: Sleeping patterns will improve Outcome: Progressing   Problem: Physical Regulation: Goal: Ability to maintain clinical measurements within normal limits will improve Outcome: Progressing   Problem: Safety: Goal: Periods of time without injury will increase Outcome: Progressing

## 2023-05-30 NOTE — Progress Notes (Signed)
   05/30/23 2045  Psych Admission Type (Psych Patients Only)  Admission Status Voluntary  Psychosocial Assessment  Patient Complaints Anxiety  Eye Contact Fair  Facial Expression Animated  Affect Appropriate to circumstance  Speech Logical/coherent  Interaction Assertive  Motor Activity Slow  Appearance/Hygiene Unremarkable  Behavior Characteristics Cooperative  Mood Pleasant;Anxious  Aggressive Behavior  Effect No apparent injury  Thought Process  Coherency WDL  Content WDL  Delusions WDL  Perception WDL  Hallucination None reported or observed  Judgment Poor  Confusion WDL  Danger to Self  Current suicidal ideation? Denies  Danger to Others  Danger to Others None reported or observed

## 2023-05-30 NOTE — Progress Notes (Signed)
   05/30/23 0545  15 Minute Checks  Location Bedroom  Visual Appearance Calm  Behavior Sleeping  Sleep (Behavioral Health Patients Only)  Calculate sleep? (Click Yes once per 24 hr at 0600 safety check) Yes  Documented sleep last 24 hours 8

## 2023-05-30 NOTE — [facility] Group Notes (Signed)
 Spiritual care group on grief and loss facilitated by Haskell Linker, M.Div.  Group Goal: Support / Education around grief and loss Members engage in facilitated group support and psycho-social education.  Group Description: Following introductions and group rules, group members engaged in facilitated group dialog and support around topic of loss, with particular support around experiences of loss in their lives. Group Identified types of loss (relationships / self / things) and identified patterns, circumstances, and changes that precipitate losses. Reflected on thoughts / feelings around loss, normalized grief responses, and recognized variety in grief experience. Group noted Worden's four tasks of grief in discussion.  Group facilitation based upon theories of Derrell Flight; drawing also upon Adlerian / Rogerian, narrative, MI.  Observations: Mr. Matters was an active participant in the group discussion.  Joselynn Amoroso L. Minetta Aly, M.Div [phone]

## 2023-05-30 NOTE — Plan of Care (Signed)
  Problem: Activity: Goal: Sleeping patterns will improve Outcome: Progressing   Problem: Health Behavior/Discharge Planning: Goal: Identification of resources available to assist in meeting health care needs will improve Outcome: Progressing

## 2023-05-30 NOTE — [facility] Group Notes (Signed)
 [facility] Group Notes:  (Nursing/MHT/Case Management/Adjunct)  Date:  05/30/2023  Time:  9:54 PM  Type of Therapy:  Psychoeducational Skills  Participation Level:  Minimal  Participation Quality:  Inattentive  Affect:  Appropriate  Cognitive:  Appropriate  Insight:  Limited  Engagement in Group:  Distracting and Resistant  Modes of Intervention:  Education  Summary of Progress/Problems: The patient attended the group but had multiple side conversations.   Shaunita Seney S 05/30/2023, 9:54 PM

## 2023-05-30 NOTE — Plan of Care (Signed)
   Problem: Education: Goal: Emotional status will improve Outcome: Progressing Goal: Mental status will improve Outcome: Progressing   Problem: Activity: Goal: Interest or engagement in activities will improve Outcome: Progressing Goal: Sleeping patterns will improve Outcome: Progressing   Problem: Safety: Goal: Periods of time without injury will increase Outcome: Progressing

## 2023-05-30 NOTE — Progress Notes (Signed)
   05/30/23 0930  Psych Admission Type (Psych Patients Only)  Admission Status Voluntary  Psychosocial Assessment  Patient Complaints Anxiety  Eye Contact Fair  Facial Expression Animated  Affect Appropriate to circumstance  Speech Logical/coherent  Interaction Assertive  Motor Activity Other (Comment) (WNL)  Appearance/Hygiene Unremarkable  Behavior Characteristics Appropriate to situation  Mood Anxious;Pleasant  Thought Process  Coherency WDL  Content WDL  Delusions None reported or observed  Perception WDL  Hallucination None reported or observed  Judgment Limited  Confusion None  Danger to Self  Current suicidal ideation? Denies  Self-Injurious Behavior No self-injurious ideation or behavior indicators observed or expressed   Agreement Not to Harm Self Yes  Description of Agreement Verbal  Danger to Others  Danger to Others None reported or observed

## 2023-05-30 NOTE — Group Note (Signed)
 Recreation Therapy Group Note   Group Topic:Stress Management  Group Date: 05/30/2023 Start Time: 0935 End Time: 1013 Facilitators: Shanan Fitzpatrick-McCall, LRT,CTRS Location: [address] Dayroom   Group Topic: Stress Management  Goal Area(s) Addresses:  Patient will identify positive stress management techniques. Patient will identify benefits of using stress management post d/c.  Intervention: Camera operator, Phone  Activity: Music Therapy. LRT and patients discussed the importance of stress management and how music can be used to relieve stress. LRT let patients pick songs that helped them deal with stress, relax or as motivation. Songs chosen by patients had to be clean and appropriate.    Education:  Stress Management, Discharge Planning.   Education Outcome: Acknowledges Education   Affect/Mood: Appropriate   Participation Level: Active   Participation Quality: Independent   Behavior: Attentive    Speech/Thought Process: Focused   Insight: Good   Judgement: Good   Modes of Intervention: Music   Patient Response to Interventions:  Receptive   Education Outcome:  In group clarification offered    Clinical Observations/Individualized Feedback: Pt came in for the last portion of group session. Pt didn't request any songs but was attentive to the songs that were requested by peers.      Plan: Continue to engage patient in RT group sessions 2-3x/week.   Ieshia Hatcher-McCall, LRT,CTRS 05/30/2023 12:48 PM

## 2023-05-30 NOTE — Progress Notes (Signed)
 [facility] MD Progress Note  05/30/2023 5:35 PM Michael [name]  MRN:  [id number] Subjective:   48 year old Caucasian male, single, homeless, recently lost his job.  Background history of MDD recurrent.  Presented with worsening depression in context of recent job loss and being homeless.  Had some suicidal thoughts but no intent to act on it.  Her daughter has been protective for him. Routine labs were essentially normal.  No alcohol and psychoactive substance on board.  I assumed care of this patient today.  Chart was reviewed.  Patient discussed as multidisciplinary team meeting.  Nursing staff reports that he has been appropriate on the unit.  No challenging behavior.  He has been adherent with his medicine.  No as needed medications lately.  I met with the patient for the first time today.  He tells me that he has been doing really well.  States that he is scheduled for discharge tomorrow.  He plans to stay with a friend called Cora Deputy.  Patient tells me that he just lost his job before he came into the hospital.  States that he ran out of his medications for about 2 weeks prior to the hospitalization.  He states that he feels a lot better now that he is back on his medicine.  He is no longer feeling depressed.  No current suicidal thoughts.  No homicidal thoughts.  No thoughts of violence.  Patient is not endorsing any manic symptoms.  No psychotic symptoms.   Principal Problem: MDD (major depressive disorder), recurrent severe, without psychosis (HCC) Diagnosis: Principal Problem:   MDD (major depressive disorder), recurrent severe, without psychosis (HCC) Active Problems:   GAD (generalized anxiety disorder)   Panic disorder  Total Time spent with patient: 30 minutes  Past Psychiatric History:  See H&P.  Past Medical History:  Past Medical History:  Diagnosis Date   Anxiety    Depression    Panic attack    TGA (transient global amnesia)    TIA (transient ischemic attack)    No past  surgical history on file. Family History: No family history on file. Family Psychiatric  History:  See H&P.  Social History:  Social History   Substance and Sexual Activity  Alcohol Use Not Currently   Comment: rarely     Social History   Substance and Sexual Activity  Drug Use No    Social History   Socioeconomic History   Marital status: Single    Spouse name: Not on file   Number of children: Not on file   Years of education: Not on file   Highest education level: Not on file  Occupational History   Not on file  Tobacco Use   Smoking status: Some Days    Current packs/day: 0.50    Average packs/day: 0.5 packs/day for 20.0 years (10.0 ttl pk-yrs)    Types: Cigarettes   Smokeless tobacco: Not on file  Vaping Use   Vaping status: Every Day   Substances: Nicotine , Flavoring  Substance and Sexual Activity   Alcohol use: Not Currently    Comment: rarely   Drug use: No   Sexual activity: Yes    Birth control/protection: None  Other Topics Concern   Not on file  Social History Narrative   Not on file   Social Drivers of Health   Financial Resource Strain: Not on file  Food Insecurity: Food Insecurity Present (05/27/2023)   Hunger Vital Sign    Worried About Running Out of Food in the  Last Year: Sometimes true    Ran Out of Food in the Last Year: Sometimes true  Transportation Needs: Unmet Transportation Needs (05/27/2023)   PRAPARE - Administrator, Civil Service (Medical): Yes    Lack of Transportation (Non-Medical): Yes  Physical Activity: Not on file  Stress: Not on file  Social Connections: Unknown (09/01/2022)   Received from [employer]   Social Network    Social Network: Not on file   Additional Social History:    Current Medications: Current Facility-Administered Medications  Medication Dose Route Frequency Provider Last Rate Last Admin   acetaminophen  (TYLENOL ) tablet 650 mg  650 mg Oral Q6H PRN Onuoha, Chinwendu V, NP   650 mg at  05/30/23 1557   alum & mag hydroxide-simeth (MAALOX/MYLANTA) 200-200-20 MG/5ML suspension 30 mL  30 mL Oral Q4H PRN Onuoha, Chinwendu V, NP       desvenlafaxine  (PRISTIQ ) 24 hr tablet 100 mg  100 mg Oral Daily Ji, Andrew, MD   100 mg at 05/30/23 0804   haloperidol  (HALDOL ) tablet 5 mg  5 mg Oral TID PRN Onuoha, Chinwendu V, NP       And   diphenhydrAMINE  (BENADRYL ) capsule 50 mg  50 mg Oral TID PRN Onuoha, Chinwendu V, NP       haloperidol  lactate (HALDOL ) injection 5 mg  5 mg Intramuscular TID PRN Onuoha, Chinwendu V, NP       And   diphenhydrAMINE  (BENADRYL ) injection 50 mg  50 mg Intramuscular TID PRN Onuoha, Chinwendu V, NP       And   LORazepam  (ATIVAN ) injection 2 mg  2 mg Intramuscular TID PRN Onuoha, Chinwendu V, NP       haloperidol  lactate (HALDOL ) injection 10 mg  10 mg Intramuscular TID PRN Onuoha, Chinwendu V, NP       And   diphenhydrAMINE  (BENADRYL ) injection 50 mg  50 mg Intramuscular TID PRN Onuoha, Chinwendu V, NP       And   LORazepam  (ATIVAN ) injection 2 mg  2 mg Intramuscular TID PRN Onuoha, Chinwendu V, NP       gabapentin  (NEURONTIN ) capsule 300 mg  300 mg Oral TID Augusta Blizzard, MD   300 mg at 05/30/23 1257   hydrOXYzine  (ATARAX ) tablet 25 mg  25 mg Oral Q4H PRN Asuncion Layer I, NP   25 mg at 05/30/23 1257   magnesium  hydroxide (MILK OF MAGNESIA) suspension 30 mL  30 mL Oral Daily PRN Onuoha, Chinwendu V, NP       nicotine  (NICODERM CQ  - dosed in mg/24 hours) patch 14 mg  14 mg Transdermal Daily Onuoha, Chinwendu V, NP   14 mg at 05/30/23 1610   QUEtiapine  (SEROQUEL ) tablet 100 mg  100 mg Oral QHS Ji, Andrew, MD   100 mg at 05/29/23 2115   traZODone  (DESYREL ) tablet 50 mg  50 mg Oral QHS PRN Asuncion Layer I, NP   50 mg at 05/29/23 2115    Lab Results: No results found for this or any previous visit (from the past 48 hours).  Blood Alcohol level:  Lab Results  Component Value Date   ETH <10 05/26/2023   ETH 14 (H) 03/28/2023    Metabolic Disorder Labs: Lab Results   Component Value Date   HGBA1C 5.2 03/30/2023   MPG 102.54 03/30/2023   No results found for: "PROLACTIN" Lab Results  Component Value Date   CHOL 254 (H) 03/30/2023   TRIG 144 03/30/2023   HDL 65 03/30/2023  CHOLHDL 3.9 03/30/2023   VLDL 29 03/30/2023   LDLCALC 160 (H) 03/30/2023    Physical Findings: AIMS:  , ,  ,  ,    CIWA:    COWS:     Musculoskeletal: Strength & Muscle Tone: within normal limits Gait & Station: normal Patient leans: N/A  Psychiatric Specialty Exam:  Presentation  General Appearance:  Casually dressed, not in any distress, appropriate behavior, engaged politely.  No EPS.  Eye Contact: Good.  Speech: Spontaneous.  Normal rate, tone and volume.  .  Mood and Affect  Mood: Euthymic.  Affect: Restricted and appropriate.  Thought Process  Thought Processes: Linear and goal directed.  Descriptions of Associations:Intact  Orientation:Full (Time, Place and Person)  Thought Content: No current suicidal thoughts.  No homicidal thoughts.  No thoughts of violence.  No negative ruminative flooding.  No guilty ruminations.  No delusional theme.  No obsessions.  Hallucinations: No hallucination in any modality.  Sensorium  Memory: Good.  Judgment: Good.  Insight: Good  Executive Functions  Concentration: Good.  Attention Span: Good.  Recall: Good.  Fund of Knowledge: Good.  Language: Good   Psychomotor Activity  Normal psychomotor activity    Physical Exam: Physical Exam ROS Blood pressure 121/71, pulse 93, temperature 98.1 F (36.7 C), temperature source Oral, resp. rate 16, height 6\' 3"  (1.905 m), weight 86.2 kg, SpO2 97%. Body mass index is 23.75 kg/m.   Treatment Plan Summary: Patient is back on his home medications.  He seems to be doing well at bedtime.  His mood is back to normal healthy.  No dangerousness.  Will get feedback from his friend and from his daughter tomorrow.  Appropriate discharge in a  couple of days.  1.  Continue desvenlafaxine  100 mg daily. 2.  Continue quetiapine  100 mg at bedtime. 3.  Continue gabapentin  300 mg 3 times daily. 4.  Continue to monitor mood and behavior and interaction with others. 5.  Continue to encourage unit groups and therapeutic activities. 6.  Social worker will gather collateral/feedback from family/friends. 7.  Social work will coordinate discharge and aftercare.   Amelie Jury, MD 05/30/2023, 5:35 PM

## 2023-05-30 NOTE — [facility] Group Notes (Signed)
 Adult Psychoeducational Group Note  Date:  05/30/2023 Time:  9:42 AM  Group Topic/Focus:  Goals Group:   The focus of this group is to help patients establish daily goals to achieve during treatment and discuss how the patient can incorporate goal setting into their daily lives to aide in recovery.  Participation Level:  Did Not Attend  Michael [name] 05/30/2023, 9:42 AM

## 2023-05-31 DIAGNOSIS — F332 Major depressive disorder, recurrent severe without psychotic features: Secondary | ICD-10-CM | POA: Diagnosis not present

## 2023-05-31 MED ORDER — TRAZODONE HCL 50 MG PO TABS: 50.0000 mg | ORAL_TABLET | Freq: Every evening | ORAL | 0 refills | Status: DC | PRN

## 2023-05-31 MED ORDER — VENLAFAXINE HCL ER 150 MG PO CP24: 150.0000 mg | ORAL_CAPSULE | Freq: Every day | ORAL | 0 refills | Status: DC

## 2023-05-31 MED ORDER — QUETIAPINE FUMARATE 100 MG PO TABS: 100.0000 mg | ORAL_TABLET | Freq: Every day | ORAL | 0 refills | Status: DC

## 2023-05-31 MED ORDER — DESVENLAFAXINE SUCCINATE ER 100 MG PO TB24: 100.0000 mg | ORAL_TABLET | Freq: Every day | ORAL | 0 refills | Status: DC

## 2023-05-31 MED ORDER — NICOTINE 14 MG/24HR TD PT24: 14.0000 mg | MEDICATED_PATCH | Freq: Every day | TRANSDERMAL | 0 refills | Status: DC

## 2023-05-31 MED ORDER — GABAPENTIN 300 MG PO CAPS: 300.0000 mg | ORAL_CAPSULE | Freq: Three times a day (TID) | ORAL | 0 refills | Status: DC

## 2023-05-31 NOTE — Group Note (Signed)
 LCSW Group Therapy Note   Group Date: 05/31/2023 Start Time: 1100 End Time: 1200   Participation:  patient was present.  He listened but didn't participate in the conversation.  Type of Therapy:  Group Therapy  Title:  Money Matters: Ecologist, Confidence and Peace of Mind  Objective: To help participants understand the impact of financial stability on well-being through the lens of Maslow's Hierarchy of Needs and develop practical strategies for budgeting, saving, and debt repayment.  Goals: Increase awareness of spending habits and financial priorities, recognizing how money supports basic needs, security, and relationships. Develop simple budgeting and saving strategies to enhance stability and peace of mind.  Reduce financial stress by creating a realistic debt repayment plan, supporting long-term confidence and well-being.  Summary:  Participants explored how financial stability connects to basic needs, relationships, and self-esteem using Maslow's Hierarchy. They discussed budgeting, saving, and debt repayment strategies, identifying small, manageable changes. Through interactive discussion and self-reflection, they gained insight into their financial habits and created personal action steps for improvement.  Therapeutic Modalities Used: Elements of Cognitive Behavioral Therapy (CBT) - Addressing financial stress and thought patterns. Psychoeducation - Engineer, agricultural. Elements of Motivational Interviewing (MI) - Encouraging realistic, achievable changes. Group Support - Reducing shame and stress through shared experiences.   Mearl Harewood O Rodnesha Elie, LCSWA 05/31/2023  1:14 PM

## 2023-05-31 NOTE — Plan of Care (Signed)
  Problem: Education: Goal: Knowledge of [facility] Education information/materials will improve Outcome: Completed/Met Goal: Emotional status will improve Outcome: Completed/Met Goal: Mental status will improve Outcome: Completed/Met Goal: Verbalization of understanding the information provided will improve Outcome: Completed/Met   Problem: Activity: Goal: Interest or engagement in activities will improve Outcome: Completed/Met Goal: Sleeping patterns will improve Outcome: Completed/Met   Problem: Coping: Goal: Ability to verbalize frustrations and anger appropriately will improve Outcome: Completed/Met Goal: Ability to demonstrate self-control will improve Outcome: Completed/Met   Problem: Health Behavior/Discharge Planning: Goal: Identification of resources available to assist in meeting health care needs will improve Outcome: Completed/Met Goal: Compliance with treatment plan for underlying cause of condition will improve Outcome: Completed/Met   Problem: Physical Regulation: Goal: Ability to maintain clinical measurements within normal limits will improve Outcome: Completed/Met   Problem: Safety: Goal: Periods of time without injury will increase Outcome: Completed/Met   Problem: Education: Goal: Knowledge of [facility] Education information/materials will improve Outcome: Completed/Met Goal: Emotional status will improve Outcome: Completed/Met Goal: Mental status will improve Outcome: Completed/Met Goal: Verbalization of understanding the information provided will improve Outcome: Completed/Met   Problem: Activity: Goal: Interest or engagement in activities will improve Outcome: Completed/Met Goal: Sleeping patterns will improve Outcome: Completed/Met   Problem: Coping: Goal: Ability to verbalize frustrations and anger appropriately will improve Outcome: Completed/Met Goal: Ability to demonstrate self-control will improve Outcome:  Completed/Met   Problem: Health Behavior/Discharge Planning: Goal: Identification of resources available to assist in meeting health care needs will improve Outcome: Completed/Met Goal: Compliance with treatment plan for underlying cause of condition will improve Outcome: Completed/Met   Problem: Physical Regulation: Goal: Ability to maintain clinical measurements within normal limits will improve Outcome: Completed/Met   Problem: Safety: Goal: Periods of time without injury will increase Outcome: Completed/Met   Problem: Education: Goal: Ability to make informed decisions regarding treatment will improve Outcome: Completed/Met   Problem: Coping: Goal: Coping ability will improve Outcome: Completed/Met   Problem: Health Behavior/Discharge Planning: Goal: Identification of resources available to assist in meeting health care needs will improve Outcome: Completed/Met   Problem: Medication: Goal: Compliance with prescribed medication regimen will improve Outcome: Completed/Met   Problem: Self-Concept: Goal: Ability to disclose and discuss suicidal ideas will improve Outcome: Completed/Met   Problem: Education: Goal: Knowledge of disease or condition will improve Outcome: Completed/Met Goal: Understanding of discharge needs will improve Outcome: Completed/Met   Problem: Health Behavior/Discharge Planning: Goal: Ability to identify changes in lifestyle to reduce recurrence of condition will improve Outcome: Completed/Met Goal: Identification of resources available to assist in meeting health care needs will improve Outcome: Completed/Met   Problem: Physical Regulation: Goal: Complications related to the disease process, condition or treatment will be avoided or minimized Outcome: Completed/Met   Problem: Safety: Goal: Ability to remain free from injury will improve Outcome: Completed/Met

## 2023-05-31 NOTE — Group Note (Signed)
 Date:  05/31/2023 Time:  9:10 AM  Group Topic/Focus:  Goals Group:   The focus of this group is to help patients establish daily goals to achieve during treatment and discuss how the patient can incorporate goal setting into their daily lives to aide in recovery. Orientation:   The focus of this group is to educate the patient on the purpose and policies of crisis stabilization and provide a format to answer questions about their admission.  The group details unit policies and expectations of patients while admitted.    Participation Level:  Active  Participation Quality:  Appropriate  Affect:  Appropriate  Cognitive:  Appropriate  Insight: Appropriate  Engagement in Group:  Engaged  Modes of Intervention:  Discussion and Orientation  Additional Comments:    Jearlene Bridwell D Elvis Boot 05/31/2023, 9:10 AM

## 2023-05-31 NOTE — Progress Notes (Signed)
 Michael [name]  D/C'd Home per MD order.  Discussed with the patient and all questions fully answered.  An After Visit Summary was printed and given to the patient. Patient received prescription.  D/c education completed with patient including follow up instructions, medication list, d/c activities limitations if indicated, with other d/c instructions as indicated by MD - patient able to verbalize understanding, all questions fully answered. Patient denies SI/HI/AVH, and was appreciative of care received during his stay in the [facility] at discharge.   Patient instructed to return to ED, call [phone], or call MD for any changes in condition.   Patient escorted to the main entrance, and D/C home via public transport, with the cab voucher given to him. Jeoffrey Mole 05/31/2023 2:01 PM

## 2023-05-31 NOTE — Discharge Summary (Signed)
 Physician Discharge Summary Note  Patient:  Michael [name] is an 48 y.o., male MRN:  [id number] DOB:  Jan 20, 1976 Patient phone:  [phone] (home)  Patient address:   [place] [address],  Total Time spent with patient: 45 minutes  Date of Admission:  05/26/2023 Date of Discharge: 06/01/2023  Reason for Admission:   This is the second psychiatric admission in this [facility] for this 48 year old Caucasian male with hx of major depressive disorder & alcohol use disorder. Admitted to the [facility] this time around from the [facility] ED with complaint of worsening symptoms of depression triggering suicidal ideations, almost getting to the point of making a plan which would have been to overdose on his medications. After medical evaluation/clearance at the ED, patient was transferred to the [facility] for further psychiatric evaluation/treatments. During this evaluation, patient reports,    "I took myself to the [facility] ED on Wednesday, two days ago to be checked out. I have been having worsening symptoms of depression. This my depression has been going on since 2021. That was the year I lost my business, car & my home because of Covid-19. I was doing very well in my life then until the Covid-19 happened. I was in this [facility] last October, 2024 to get off of Xanax  addiction. I was also on Pristiq  then for my depression & I was doing great on it. But while I was in this hospital, the doctor stopped my Pristiq  & put me on Effexor -XR. Since starting & being on Effexor  XR all that time, I have not felt better. I stayed depressed & I have a lot of anxiety issues as well. Pristiq  has been effective for me in the past. When I was on it, I was able to function, has the will to live & has the energy to do things. But since being off of Pristiq , my mind feels like it is frozen, stuck in a rot & continued to progress to the point that I'm feeling like not being on this earth any more, but lately, I have  been thinking of a way to do (kill myself). The only thing keeping me from doing it is my 73 year old daughter. I want to be here for her & myself. When I started thinking of may be die my overdose, that thought alone scared me, so I decided that it is time to go back to the hospital".   Objective: Michael [name] presents alert, oriented & aware of situation. He presents with a reactive affect, good eye contact & verbally responsive. He presents as a good historian. He says part of his stressors include his homelessness that seems to not get better. He says he is currently employed but has not been able to get himself to the point he was 5 years ago. He says he sleeps at his friends' houses after sleeping in his car for 3 years. He states that he was not an alcohol drinker, but lately, he found himself drinking more alcohol about 12 packs of beer to drink at a time. He says he was buying & using some kind of edibles called Kratom from the tobacco store. He continues to endorse passive suicidal ideations, however, denies any SIHI, AVH, delusional thoughts or paranoia. He does not appear to be responding to any internal stimuli.   Principal Problem: MDD (major depressive disorder), recurrent severe, without psychosis (HCC) Discharge Diagnoses: Principal Problem:   MDD (major depressive disorder), recurrent severe, without psychosis (HCC)  Active Problems:   GAD (generalized anxiety disorder)   Panic disorder   Past Psychiatric History:  Previous Psych Diagnoses: Chronic MDD, GAD, panic disorder Prior inpatient treatment: Yes at age of 48 years old, due to severe panic attack. Current/prior outpatient treatment: "I don't have one. Prior rehab hx: Denies Psychotherapy hx: NA History of suicide: Yes, history of drug overdose in 2023 History of homicide or aggression: Denies Psychiatric medication history: Yes, patient has been on a trial of trazodone , Pristiq , Zyprexa , Adderall, BuSpar , and Xanax . Psychiatric  medication compliance history: Noncompliance due to affordability Neuromodulation history: Denies Current Psychiatrist: None Current therapist: Denies   Substance Abuse Hx: Alcohol: "I have been drinking alcohol quite a bit lately". 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 Rehab hx: Denies   Past Medical History:  Past Medical History:  Diagnosis Date   Anxiety    Depression    Panic attack    TGA (transient global amnesia)    TIA (transient ischemic attack)    No past surgical history on file. Family History: No family history on file. Family Psychiatric  History:  Mental illnesses (BP, Major depression, anxiety disorder) run on the maternal side of my family. Psych Rx: Denies Substance use family hx: Mother was an alcoholic  Social History:  Social History   Substance and Sexual Activity  Alcohol Use Not Currently   Comment: rarely     Social History   Substance and Sexual Activity  Drug Use No    Social History   Socioeconomic History   Marital status: Single    Spouse name: Not on file   Number of children: Not on file   Years of education: Not on file   Highest education level: Not on file  Occupational History   Not on file  Tobacco Use   Smoking status: Some Days    Current packs/day: 0.50    Average packs/day: 0.5 packs/day for 20.0 years (10.0 ttl pk-yrs)    Types: Cigarettes   Smokeless tobacco: Not on file  Vaping Use   Vaping status: Every Day   Substances: Nicotine , Flavoring  Substance and Sexual Activity   Alcohol use: Not Currently    Comment: rarely   Drug use: No   Sexual activity: Yes    Birth control/protection: None  Other Topics Concern   Not on file  Social History Narrative   Not on file   Social Drivers of Health   Financial Resource Strain: Not on file  Food Insecurity: Food Insecurity Present (05/27/2023)   Hunger Vital  Sign    Worried About Running Out of Food in the Last Year: Sometimes true    Ran Out of Food in the Last Year: Sometimes true  Transportation Needs: Unmet Transportation Needs (05/27/2023)   PRAPARE - Administrator, Civil Service (Medical): Yes    Lack of Transportation (Non-Medical): Yes  Physical Activity: Not on file  Stress: Not on file  Social Connections: Unknown (09/01/2022)   Received from [facility]   Social Network    Social Network: Not on file    Hospital Course: The patient was admitted on suicide precautions.  Home medications were restarted.  He tolerated his medications well.  As the medication kicked him, he started feeling better.  Patient participated with unit groups and therapeutic activities.  Sleep-wake cycle regularized.  Other biological functions regularized.  Patient  did not require any psychiatric or medical emergency measures during his hospital stay.  Seen today.  In good spirits.  He plans to stay with a friend.  States that his 22 year old daughter is at school.  His friend is currently at work hands were not able to get in touch with him.  Patient tells me that now the medicine is back in his system, he feels clear minded.  He is in good spirits.  No lingering suicidal thoughts.  No homicidal thoughts.  No thoughts of violence.  Patient is not endorsing any features of depression.  No overwhelming anxiety.  No manic features.  No psychotic features.  He is tolerating his medicines without any adverse effects.  Nursing staff reports that patient slept well last night.  Recorded sleep time is 8 hours.  He has been appropriate on the unit.  No challenging behavior.  No observed response to internal stimuli.  Not voicing any futility thoughts.  Patient and team agrees that he is back to his baseline.  Team agrees with discharge today.  Physical Findings: AIMS:  , ,  ,  ,    CIWA:    COWS:     Musculoskeletal: Strength & Muscle Tone: within  normal limits Gait & Station: normal Patient leans: N/A   Psychiatric Specialty Exam:  Presentation  General Appearance:  Casually dressed, slim build, not in any distress, appropriate behavior, engaged politely.  No EPS.  Eye Contact: Good.  Speech: Spontaneous.  Normal rate, tone and volume.  Normal prosody of speech.  Mood and Affect  Mood: Euthymic.  Affect: Full range and appropriate.  Thought Process  Thought Processes: Linear and goal directed.  Descriptions of Associations:Intact  Orientation:Full (Time, Place and Person)  Thought Content: No current suicidal thoughts.  No homicidal thoughts.  No thoughts of violence.  No negative ruminative flooding.  No guilty ruminations.  No delusional theme.  No obsessions.  Hallucinations: No hallucination in any modality.  Sensorium  Memory: Good.  Judgment: Good.  Insight: Good  Executive Functions  Concentration: Good.  Attention Span: Good.  Recall: Good.  Fund of Knowledge: Good.  Language: Good   Psychomotor Activity  Normal psychomotor activity    Physical Exam: Physical Exam ROS Blood pressure 115/88, pulse 79, temperature 97.9 F (36.6 C), temperature source Oral, resp. rate 16, height 6\' 3"  (1.905 m), weight 86.2 kg, SpO2 97%. Body mass index is 23.75 kg/m.   Social History   Tobacco Use  Smoking Status Some Days   Current packs/day: 0.50   Average packs/day: 0.5 packs/day for 20.0 years (10.0 ttl pk-yrs)   Types: Cigarettes  Smokeless Tobacco Not on file   Tobacco Cessation:  A prescription for an FDA-approved tobacco cessation medication provided at discharge   Blood Alcohol level:  Lab Results  Component Value Date   ETH <10 05/26/2023   ETH 14 (H) 03/28/2023    Metabolic Disorder Labs:  Lab Results  Component Value Date   HGBA1C 5.2 03/30/2023   MPG 102.54 03/30/2023   No results found for: "PROLACTIN" Lab Results  Component Value Date   CHOL 254 (H)  03/30/2023   TRIG 144 03/30/2023   HDL 65 03/30/2023   CHOLHDL 3.9 03/30/2023   VLDL 29 03/30/2023   LDLCALC 160 (H) 03/30/2023    See Psychiatric Specialty Exam and Suicide Risk Assessment completed by Attending Physician prior to discharge.  Discharge destination:  Home  Is patient on multiple antipsychotic therapies at discharge:  No  Has Patient had three or more failed trials of antipsychotic monotherapy by history:  No  Recommended Plan for Multiple Antipsychotic Therapies: NA  Discharge Instructions     Diet - low sodium heart healthy   Complete by: As directed    Increase activity slowly   Complete by: As directed       Allergies as of 05/31/2023   No Known Allergies      Medication List     STOP taking these medications    hydrOXYzine  25 MG tablet Commonly known as: ATARAX        TAKE these medications      Indication  desvenlafaxine  100 MG 24 hr tablet Commonly known as: PRISTIQ  Take 1 tablet (100 mg total) by mouth daily. Start taking on: June 01, 2023  Indication: Major Depressive Disorder   gabapentin  300 MG capsule Commonly known as: NEURONTIN  Take 1 capsule (300 mg total) by mouth 3 (three) times daily. What changed:  medication strength how much to take  Indication: Generalized Anxiety Disorder   nicotine  14 mg/24hr patch Commonly known as: NICODERM CQ  - dosed in mg/24 hours Place 1 patch (14 mg total) onto the skin daily. Start taking on: June 01, 2023  Indication: Nicotine  Addiction   QUEtiapine  100 MG tablet Commonly known as: SEROQUEL  Take 1 tablet (100 mg total) by mouth at bedtime. What changed:  medication strength how much to take when to take this  Indication: Generalized Anxiety Disorder, Mood stabilization.   traZODone  50 MG tablet Commonly known as: DESYREL  Take 1 tablet (50 mg total) by mouth at bedtime as needed for sleep.  Indication: Trouble Sleeping   venlafaxine  XR 150 MG 24 hr capsule Commonly known  as: EFFEXOR -XR Take 1 capsule (150 mg total) by mouth daily with breakfast.  Indication: Major Depressive Disorder        Follow-up Information     [facility] Go to.   Specialty: Professional Counselor Why: Please go to this provider on 06/03/22 at 9:00 am for therapy services.  You may also go on M-F, from 9 am to 1 pm (which is walk-in times for new patients). This is not an appointment - new patients must go during walk in hours to establish care. Contact information: [employer] of the [address] [place]  [address] [place] [address] [phone]         [facility]. Go to.   Specialty: Urgent Care Why: Please go to this provider on 06/06/23 arriving by 7:00AM for same day service for new patients. (This is not an appointment). Otherwise, you may go during new patient walk in hours M-F, arrive by 7 am. Contact information: [address] [place]  [address] [phone]                Follow-up recommendations: Patient will stay on medication as recommended.  No dietary restrictions.  No restrictions with respect to activity.  Signed: Amelie Jury, MD 05/31/2023, 10:25 AM

## 2023-05-31 NOTE — BHH Suicide Risk Assessment (Signed)
 [facility] Discharge Suicide Risk Assessment   Principal Problem: MDD (major depressive disorder), recurrent severe, without psychosis (HCC) Discharge Diagnoses: Principal Problem:   MDD (major depressive disorder), recurrent severe, without psychosis (HCC) Active Problems:   GAD (generalized anxiety disorder)   Panic disorder   Total Time spent with patient: 45 minutes  Musculoskeletal: Strength & Muscle Tone: within normal limits Gait & Station: normal Patient leans: N/A  Psychiatric Specialty Exam  Presentation  General Appearance:  Casual; Fairly Groomed  Eye Contact: Good  Speech: Clear and Coherent; Normal Rate  Speech Volume: Normal  Handedness: Right   Mood and Affect  Mood: Anxious; Depressed; Hopeless  Duration of Depression Symptoms: Greater than two weeks  Affect: Congruent   Thought Process  Thought Processes: Coherent; Goal Directed; Linear  Descriptions of Associations:Intact  Orientation:Full (Time, Place and Person)  Thought Content:Logical  History of Schizophrenia/Schizoaffective disorder:No  Duration of Psychotic Symptoms:No data recorded Hallucinations:No data recorded Ideas of Reference:None  Suicidal Thoughts:No data recorded Homicidal Thoughts:No data recorded  Sensorium  Memory: Immediate Good; Recent Good; Remote Good  Judgment: Fair  Insight: Fair   Art therapist  Concentration: Good  Attention Span: Good  Recall: Good  Fund of Knowledge: Fair  Language: Good   Psychomotor Activity  Psychomotor Activity:No data recorded  Assets  Assets: Communication Skills; Desire for Improvement; Physical Health; Resilience; Social Support   Sleep  Sleep:No data recorded  Physical Exam: Physical Exam ROS Blood pressure 115/88, pulse 79, temperature 97.9 F (36.6 C), temperature source Oral, resp. rate 16, height 6\' 3"  (1.905 m), weight 86.2 kg, SpO2 97%. Body mass index is 23.75 kg/m.  Mental  Status Per Nursing Assessment::   On Admission:  Suicidal ideation indicated by patient  Demographic Factors:  Male, Caucasian, and Unemployed  Loss Factors: Decrease in vocational status  Historical Factors: Family history of mental illness or substance abuse  Risk Reduction Factors:   Responsible for children under 5 years of age, Sense of responsibility to family, Positive social support, Positive therapeutic relationship, and Positive coping skills or problem solving skills  Continued Clinical Symptoms:  Depression has completely resolved.  No overwhelming anxiety.  No manic features.  No psychotic features.  Cognitive Features That Contribute To Risk:  None    Suicide Risk:  Minimal: No current suicidal ideation.  No homicidal thoughts.  No current thoughts of violence.  Modifiable risk factor targeted during this admission as depression.  Patient is tolerating his medications well.  He has responded well to treatment.  He is going to stay with his friend.  He is optimistic and future oriented.  He plans to seek out the new employment.  His 92 year old daughter remains a protective factor for him. At this point in time, patient is not a danger to self or to others.  He is stable for care at the lower setting.  Follow-up Information     [facility]. Go to.   Specialty: Professional Counselor Why: Please go to this provider on 06/03/22 at 9:00 am for therapy services.  You may also go on M-F, from 9 am to 1 pm (which is walk-in times for new patients). This is not an appointment - new patients must go during walk in hours to establish care. Contact information: [employer] of the [address] [place]  [address] [place] [address] [phone]         [facility]. Go to.   Specialty: Urgent Care Why: Please go  to this provider on 06/06/23 arriving by 7:00AM for same day service for new patients. (This is not an  appointment). Otherwise, you may go during new patient walk in hours M-F, arrive by 7 am. Contact information: [address] [place]  [address] [phone]                Plan Of Care/Follow-up recommendations:  See discharge summary  Amelie Jury, MD 05/31/2023, 10:30 AM

## 2023-05-31 NOTE — Progress Notes (Addendum)
  [facility] Adult Case Management Discharge Plan :  Will you be returning to the same living situation after discharge:  No. Pt reports he will be going to stay with friend At discharge, do you have transportation home?: Yes,  pt given [place] bus pass (per request) to get to friends house  Do you have the ability to pay for your medications: No. Requested samples at discharge, pt also given [employer] card  Release of information consent forms completed and in the chart;  Patient's signature needed at discharge.  Patient to Follow up at:  Follow-up Information     [facility] [employer]. Go to.   Specialty: Professional Counselor Why: Please go to this provider on 06/03/22 at 9:00 am for therapy services.  You may also go on M-F, from 9 am to 1 pm (which is walk-in times for new patients). This is not an appointment - new patients must go during walk in hours to establish care. Contact information: [employer] of the [address] [place]  [address] [place] [address] [phone]         [facility]. Go to.   Specialty: Urgent Care Why: Please go to this provider on 06/06/23 arriving by 7:00AM for same day service for new patients. (This is not an appointment). Otherwise, you may go during new patient walk in hours M-F, arrive by 7 am. Contact information: [address] [place]  [address] [phone]                Next level of care provider has access to [facility] Link:no  Safety Planning and Suicide Prevention discussed: Yes,  information given to patient at discharge, declined all consents      Has patient been referred to the Quitline?: Yes, faxed/e-referral on 4/22  Patient has been referred for addiction treatment: Patient refused referral for treatment.  Vonzell Guerin, LCSWA 05/31/2023, 10:14 AM

## 2023-05-31 NOTE — Group Note (Signed)
 Recreation Therapy Group Note   Group Topic:Animal Assisted Therapy   Group Date: 05/31/2023 Start Time: 1610 End Time: 1027 Facilitators: Journii Nierman-McCall, LRT,CTRS Location: [address] Dayroom   Animal-Assisted Activity (AAA) Program Checklist/Progress Notes Patient Eligibility Criteria Checklist & Daily Group note for Rec Tx Intervention  AAA/T Program Assumption of Risk Form signed by Patient/ or Parent Legal Guardian Yes  Patient is free of allergies or severe asthma Yes  Patient reports no fear of animals Yes  Patient reports no history of cruelty to animals Yes  Patient understands his/her participation is voluntary Yes  Patient washes hands before animal contact Yes  Patient washes hands after animal contact Yes  Education: Hand Washing, Appropriate Animal Interaction   Education Outcome: Acknowledges education.    Affect/Mood: Appropriate   Participation Level: Engaged   Participation Quality: Independent   Behavior: Appropriate   Speech/Thought Process: Focused   Insight: Good   Judgement: Good   Modes of Intervention: Teaching laboratory technician   Patient Response to Interventions:  Engaged   Education Outcome:  In group clarification offered    Clinical Observations/Individualized Feedback: Patient attended session and interacted appropriately with therapy dog and peers. Patient asked appropriate questions about therapy dog and his training. Patient shared stories about their pets at home with group.    Plan: Continue to engage patient in RT group sessions 2-3x/week.   Santoria Chason-McCall, LRT,CTRS 05/31/2023 2:02 PM

## 2023-06-02 ENCOUNTER — Other Ambulatory Visit: Payer: Self-pay

## 2023-06-02 ENCOUNTER — Emergency Department (HOSPITAL_COMMUNITY)
Admission: EM | Admit: 2023-06-02 | Discharge: 2023-06-03 | Disposition: A | Discharge status: Other Acute Inpatient Hospital | Payer: Self-pay | Attending: Emergency Medicine | Admitting: Emergency Medicine

## 2023-06-02 DIAGNOSIS — F411 Generalized anxiety disorder: Secondary | ICD-10-CM | POA: Insufficient documentation

## 2023-06-02 DIAGNOSIS — F419 Anxiety disorder, unspecified: Secondary | ICD-10-CM

## 2023-06-02 DIAGNOSIS — F1410 Cocaine abuse, uncomplicated: Secondary | ICD-10-CM

## 2023-06-02 DIAGNOSIS — F332 Major depressive disorder, recurrent severe without psychotic features: Secondary | ICD-10-CM | POA: Insufficient documentation

## 2023-06-02 DIAGNOSIS — F410 Panic disorder [episodic paroxysmal anxiety] without agoraphobia: Secondary | ICD-10-CM | POA: Insufficient documentation

## 2023-06-02 DIAGNOSIS — R45851 Suicidal ideations: Secondary | ICD-10-CM | POA: Insufficient documentation

## 2023-06-02 DIAGNOSIS — F430 Acute stress reaction: Secondary | ICD-10-CM

## 2023-06-02 DIAGNOSIS — R030 Elevated blood-pressure reading, without diagnosis of hypertension: Secondary | ICD-10-CM

## 2023-06-02 DIAGNOSIS — Z79899 Other long term (current) drug therapy: Secondary | ICD-10-CM | POA: Insufficient documentation

## 2023-06-02 DIAGNOSIS — Z59819 Housing instability, housed unspecified: Secondary | ICD-10-CM

## 2023-06-02 LAB — COMPREHENSIVE METABOLIC PANEL WITH GFR
ALT: 15 U/L (ref 0–44)
AST: 18 U/L (ref 15–41)
Albumin: 4.5 g/dL (ref 3.5–5.0)
Alkaline Phosphatase: 82 U/L (ref 38–126)
Anion gap: 12 (ref 5–15)
BUN: 21 mg/dL — ABNORMAL HIGH (ref 6–20)
CO2: 25 mmol/L (ref 22–32)
Calcium: 9.2 mg/dL (ref 8.9–10.3)
Chloride: 96 mmol/L — ABNORMAL LOW (ref 98–111)
Creatinine, Ser: 0.93 mg/dL (ref 0.61–1.24)
GFR, Estimated: >60 mL/min (ref >60)
Glucose, Bld: 111 mg/dL — ABNORMAL HIGH (ref 70–99)
Potassium: 4 mmol/L (ref 3.5–5.1)
Sodium: 133 mmol/L — ABNORMAL LOW (ref 135–145)
Total Bilirubin: 1 mg/dL (ref 0.0–1.2)
Total Protein: 8.7 g/dL — ABNORMAL HIGH (ref 6.5–8.1)

## 2023-06-02 LAB — CBC
HCT: 47 % (ref 39.0–52.0)
Hemoglobin: 15.4 g/dL (ref 13.0–17.0)
MCH: 31 pg (ref 26.0–34.0)
MCHC: 32.8 g/dL (ref 30.0–36.0)
MCV: 94.8 fL (ref 80.0–100.0)
Platelets: 352 10*3/uL (ref 150–400)
RBC: 4.96 MIL/uL (ref 4.22–5.81)
RDW: 13.1 % (ref 11.5–15.5)
WBC: 12.2 10*3/uL — ABNORMAL HIGH (ref 4.0–10.5)
nRBC: 0 % (ref 0.0–0.2)

## 2023-06-02 LAB — RAPID URINE DRUG SCREEN, HOSP PERFORMED
Amphetamines: NOT DETECTED
Barbiturates: NOT DETECTED
Benzodiazepines: NOT DETECTED
Cocaine: POSITIVE — AB
Opiates: NOT DETECTED
Tetrahydrocannabinol: NOT DETECTED

## 2023-06-02 LAB — ETHANOL: Alcohol, Ethyl (B): <15 mg/dL (ref <15)

## 2023-06-02 MED ORDER — GABAPENTIN 300 MG PO CAPS
300.0000 mg | ORAL_CAPSULE | Freq: Three times a day (TID) | ORAL | Status: DC
Start: 2023-06-02 — End: 2023-06-03
  Administered 2023-06-02 – 2023-06-03 (×3): 300 mg via ORAL
  Filled 2023-06-02 (×2): qty 1
  Filled 2023-06-02: qty 3

## 2023-06-02 MED ORDER — ACETAMINOPHEN 325 MG PO TABS
650.0000 mg | ORAL_TABLET | Freq: Once | ORAL | Status: AC
  Administered 2023-06-02: 650 mg via ORAL
  Filled 2023-06-02: qty 2

## 2023-06-02 MED ORDER — TRAZODONE HCL 50 MG PO TABS
50.0000 mg | ORAL_TABLET | Freq: Every evening | ORAL | Status: DC | PRN
  Administered 2023-06-02: 50 mg via ORAL
  Filled 2023-06-02: qty 1

## 2023-06-02 MED ORDER — NICOTINE 14 MG/24HR TD PT24
14.0000 mg | MEDICATED_PATCH | Freq: Every day | TRANSDERMAL | Status: DC
  Administered 2023-06-02 – 2023-06-03 (×2): 14 mg via TRANSDERMAL
  Filled 2023-06-02 (×2): qty 1

## 2023-06-02 MED ORDER — QUETIAPINE FUMARATE 100 MG PO TABS
100.0000 mg | ORAL_TABLET | Freq: Every day | ORAL | Status: DC
  Administered 2023-06-02: 100 mg via ORAL
  Filled 2023-06-02: qty 1

## 2023-06-02 MED ORDER — LORAZEPAM 1 MG PO TABS
1.0000 mg | ORAL_TABLET | Freq: Once | ORAL | Status: AC
  Administered 2023-06-02: 1 mg via ORAL
  Filled 2023-06-02: qty 1

## 2023-06-02 NOTE — Discharge Instructions (Signed)
 It was our pleasure to provide your ER care today - we hope that you feel better.  Avoid drug/cocaine use as it is harmful to your physical health and mental well-being. See resource guide attached in terms of accessing inpatient or outpatient substance use treatment programs.   Follow up closely with primary care doctor and behavioral health provider in the coming week. Make sure to follow up closely with primary care doctor regarding your blood pressure that is high.   For mental health issues and/or crisis, you may also go directly to the Behavioral Health Urgent Care Center - they are open 24/7 and walk-ins are welcome.    Return to ER if worse, new symptoms, fevers, chest pain, trouble breathing, or other emergency concern.

## 2023-06-02 NOTE — ED Provider Notes (Signed)
 [place] EMERGENCY DEPARTMENT AT [facility] Provider Note   CSN: [id number] Arrival date & time: 06/02/23  1847     History {Add pertinent medical, surgical, social history, OB history to HPI:1} Chief Complaint  Patient presents with   Suicidal   Homeless    Michael [name] is a 48 y.o. male.  Pt c/o feeling anxious, depressed and notes suicidal ideation - notes hx same. Denies specific plan, and denies specific desire or intent to kill self. No plan or desire to harm others. Indicates recent admission to [facility] for same with d/c two days ago - indicates feeling better then, but now feels same as when admitted. Denies new event or new stressor. Indicates struggles w homelessness on chronic basis. Normal appetite. Is eating/drinking. Some trouble sleeping at night. Denies acute physical illness.  Indicates has not followed up with outpatient bh provider.   The history is provided by the patient and medical records.       Home Medications Prior to Admission medications   Medication Sig Start Date End Date Taking? Authorizing Provider  desvenlafaxine  (PRISTIQ ) 100 MG 24 hr tablet Take 1 tablet (100 mg total) by mouth daily. 06/01/23   Izediuno, Iline Mallory, MD  gabapentin  (NEURONTIN ) 300 MG capsule Take 1 capsule (300 mg total) by mouth 3 (three) times daily. 05/31/23   Izediuno, Iline Mallory, MD  nicotine  (NICODERM CQ  - DOSED IN MG/24 HOURS) 14 mg/24hr patch Place 1 patch (14 mg total) onto the skin daily. 06/01/23   Izediuno, Iline Mallory, MD  QUEtiapine  (SEROQUEL ) 100 MG tablet Take 1 tablet (100 mg total) by mouth at bedtime. 05/31/23   IzediunoIline Mallory, MD  traZODone  (DESYREL ) 50 MG tablet Take 1 tablet (50 mg total) by mouth at bedtime as needed for sleep. 05/31/23   Amelie Jury, MD      Allergies    Patient has no known allergies.    Review of Systems   Review of Systems  Constitutional:  Negative for fever.  HENT:  Negative for sore throat.   Respiratory:   Negative for cough and shortness of breath.   Cardiovascular:  Negative for chest pain.  Gastrointestinal:  Negative for abdominal pain, diarrhea and vomiting.  Genitourinary:  Negative for flank pain.  Musculoskeletal:  Negative for back pain and neck pain.  Skin:  Negative for rash.  Neurological:  Negative for headaches.  Psychiatric/Behavioral:  Positive for dysphoric mood. Negative for self-injury.     Physical Exam Updated Vital Signs BP (!) 162/110 (BP Location: Right Arm)   Pulse (!) 125   Temp 98.9 F (37.2 C) (Oral)   Resp 20   Ht 1.93 m (6\' 4" )   Wt 86.2 kg   SpO2 99%   BMI 23.13 kg/m  Physical Exam Vitals and nursing note reviewed.  Constitutional:      Appearance: Normal appearance. He is well-developed.  HENT:     Head: Atraumatic.     Nose: Nose normal.     Mouth/Throat:     Mouth: Mucous membranes are moist.     Pharynx: Oropharynx is clear.  Eyes:     General: No scleral icterus.    Conjunctiva/sclera: Conjunctivae normal.     Pupils: Pupils are equal, round, and reactive to light.  Neck:     Trachea: No tracheal deviation.  Cardiovascular:     Rate and Rhythm: Normal rate and regular rhythm.     Pulses: Normal pulses.     Heart sounds:  Normal heart sounds. No murmur heard.    No friction rub. No gallop.  Pulmonary:     Effort: Pulmonary effort is normal. No accessory muscle usage or respiratory distress.     Breath sounds: Normal breath sounds.  Abdominal:     General: There is no distension.     Palpations: Abdomen is soft.     Tenderness: There is no abdominal tenderness.  Musculoskeletal:        General: No swelling.     Cervical back: Normal range of motion and neck supple. No rigidity.  Skin:    General: Skin is warm and dry.     Findings: No rash.  Neurological:     Mental Status: He is alert.     Comments: Alert, speech clear. Motor/sens grossly intact.   Psychiatric:     Comments: Requests med for anxiety. Appears mildly anxious,  esp during blood draw. Notes SI, denies desire or plan to end life. Does not appear to be responding to internal stimuli. No acute psychosis noted.      ED Results / Procedures / Treatments   Labs (all labs ordered are listed, but only abnormal results are displayed) Results for orders placed or performed during the hospital encounter of 06/02/23  cbc   Collection Time: 06/02/23  7:39 PM  Result Value Ref Range   WBC 12.2 (H) 4.0 - 10.5 K/uL   RBC 4.96 4.22 - 5.81 MIL/uL   Hemoglobin 15.4 13.0 - 17.0 g/dL   HCT 16.1 09.6 - 04.5 %   MCV 94.8 80.0 - 100.0 fL   MCH 31.0 26.0 - 34.0 pg   MCHC 32.8 30.0 - 36.0 g/dL   RDW 40.9 81.1 - 91.4 %   Platelets 352 150 - 400 K/uL   nRBC 0.0 0.0 - 0.2 %     EKG None  Radiology No results found.  Procedures Procedures  {Document cardiac monitor, telemetry assessment procedure when appropriate:1}  Medications Ordered in ED Medications  LORazepam  (ATIVAN ) tablet 1 mg (has no administration in time range)    ED Course/ Medical Decision Making/ A&P   {   Click here for ABCD2, HEART and other calculatorsREFRESH Note before signing :1}                              Medical Decision Making Amount and/or Complexity of Data Reviewed Labs: ordered.  Risk Prescription drug management.   Labs ordered/sent.   Differential diagnosis includes si, anxiety, depression, etc. Dispo decision including potential need for admission considered - will get labs and imaging and reassess.   Reviewed nursing notes and prior charts for additional history. External reports reviewed.   Labs reviewed/interpreted by me - wbc 12, hgb 15.   Pt requests we give him a dose of anxiety medication. Ativan  po.   Chart reviewed - hx recurrent SI, ?whether possibly some secondary gain as symptoms appear situationally dependent, patient lists ongoing main stressor as homelessness - also concerning in that it appears pt reports feeling much improved at [facility] d/c 2 days  ago, then no new event or new stressor, and indicating persistent depressed and anxiety about housing instability, and additional dose not appear interested in any regular outpatient BH f/u, but only treatment that comes with housing.   [facility] team consulted for evaluation.  Disposition per [facility] team.      {Document critical care time when appropriate:1} {Document review of labs and clinical  decision tools ie heart score, Chads2Vasc2 etc:1}  {Document your independent review of radiology images, and any outside records:1} {Document your discussion with family members, caretakers, and with consultants:1} {Document social determinants of health affecting pt's care:1} {Document your decision making why or why not admission, treatments were needed:1} Final Clinical Impression(s) / ED Diagnoses Final diagnoses:  None    Rx / DC Orders ED Discharge Orders     None

## 2023-06-02 NOTE — ED Notes (Signed)
 Pt has 5 belongings bags that have been placed in locker for room 34

## 2023-06-02 NOTE — ED Notes (Addendum)
 Pt dressed out into purple scrubs.Pt belongings placed in 3 bags and put into triage cabinet

## 2023-06-02 NOTE — ED Notes (Signed)
 Pt belongings are in locker 34. There was one bag.

## 2023-06-02 NOTE — ED Notes (Signed)
 Pt having TTS eval at this time

## 2023-06-02 NOTE — ED Notes (Signed)
 Pt wanded by security.

## 2023-06-02 NOTE — ED Triage Notes (Signed)
 Pt c/o SI. Plans to "take pills and not wake up. Is stressed from being homeless.  Aox4

## 2023-06-03 ENCOUNTER — Other Ambulatory Visit (HOSPITAL_COMMUNITY): Admission: EM | Admit: 2023-06-03 | Discharge: 2023-06-08 | Attending: Psychiatry | Admitting: Psychiatry

## 2023-06-03 DIAGNOSIS — Z5900 Homelessness unspecified: Secondary | ICD-10-CM | POA: Insufficient documentation

## 2023-06-03 DIAGNOSIS — F1410 Cocaine abuse, uncomplicated: Secondary | ICD-10-CM | POA: Diagnosis not present

## 2023-06-03 DIAGNOSIS — F1090 Alcohol use, unspecified, uncomplicated: Secondary | ICD-10-CM

## 2023-06-03 DIAGNOSIS — F1010 Alcohol abuse, uncomplicated: Secondary | ICD-10-CM | POA: Insufficient documentation

## 2023-06-03 DIAGNOSIS — R45851 Suicidal ideations: Secondary | ICD-10-CM | POA: Diagnosis not present

## 2023-06-03 DIAGNOSIS — F332 Major depressive disorder, recurrent severe without psychotic features: Secondary | ICD-10-CM

## 2023-06-03 DIAGNOSIS — F329 Major depressive disorder, single episode, unspecified: Secondary | ICD-10-CM | POA: Diagnosis present

## 2023-06-03 DIAGNOSIS — F3289 Other specified depressive episodes: Secondary | ICD-10-CM

## 2023-06-03 MED ORDER — TRAZODONE HCL 50 MG PO TABS
50.0000 mg | ORAL_TABLET | Freq: Every evening | ORAL | Status: DC | PRN
  Administered 2023-06-03 – 2023-06-06 (×3): 50 mg via ORAL
  Filled 2023-06-03: qty 1

## 2023-06-03 MED ORDER — HALOPERIDOL 5 MG PO TABS: 5.0000 mg | ORAL_TABLET | Freq: Three times a day (TID) | ORAL | Status: DC | PRN

## 2023-06-03 MED ORDER — HALOPERIDOL LACTATE 5 MG/ML IJ SOLN: 5.0000 mg | Freq: Three times a day (TID) | INTRAMUSCULAR | Status: DC | PRN

## 2023-06-03 MED ORDER — DIPHENHYDRAMINE HCL 50 MG PO CAPS: 50.0000 mg | ORAL_CAPSULE | Freq: Three times a day (TID) | ORAL | Status: DC | PRN

## 2023-06-03 MED ORDER — ACETAMINOPHEN 325 MG PO TABS
650.0000 mg | ORAL_TABLET | Freq: Four times a day (QID) | ORAL | Status: DC | PRN
  Administered 2023-06-03 – 2023-06-07 (×6): 650 mg via ORAL
  Filled 2023-06-03 (×7): qty 2

## 2023-06-03 MED ORDER — MAGNESIUM HYDROXIDE 400 MG/5ML PO SUSP: 30.0000 mL | Freq: Every day | ORAL | Status: DC | PRN

## 2023-06-03 MED ORDER — ALUM & MAG HYDROXIDE-SIMETH 200-200-20 MG/5ML PO SUSP: 30.0000 mL | ORAL | Status: DC | PRN

## 2023-06-03 MED ORDER — HALOPERIDOL LACTATE 5 MG/ML IJ SOLN: 10.0000 mg | Freq: Three times a day (TID) | INTRAMUSCULAR | Status: DC | PRN

## 2023-06-03 MED ORDER — DIPHENHYDRAMINE HCL 50 MG/ML IJ SOLN: 50.0000 mg | Freq: Three times a day (TID) | INTRAMUSCULAR | Status: DC | PRN

## 2023-06-03 MED ORDER — HYDROXYZINE HCL 25 MG PO TABS
25.0000 mg | ORAL_TABLET | Freq: Three times a day (TID) | ORAL | Status: DC | PRN
  Administered 2023-06-03: 25 mg via ORAL
  Filled 2023-06-03: qty 1

## 2023-06-03 MED ORDER — LORAZEPAM 2 MG/ML IJ SOLN
2.0000 mg | Freq: Three times a day (TID) | INTRAMUSCULAR | Status: DC | PRN
Start: 2023-06-03 — End: 2023-06-08

## 2023-06-03 MED ORDER — LORAZEPAM 2 MG/ML IJ SOLN: 2.0000 mg | Freq: Three times a day (TID) | INTRAMUSCULAR | Status: DC | PRN

## 2023-06-03 NOTE — ED Notes (Signed)
 Patient off unit to [facility] per provider. Patient alert, calm, cooperative, no s/s of distress. Discharge information and belongings given to [facility] for facility. Patient ambulatory off unit, escorted by RN . Patient transported by [employer].

## 2023-06-03 NOTE — ED Provider Notes (Signed)
 Emergency Medicine Observation Re-evaluation Note  Michael [name] is a 48 y.o. male, w/ MDD, GAD, panic disorder, benzo use disorder, who was seen on rounds today.  Pt initially presented to the ED for complaints of Suicidal and Homeless Currently, the patient is resting.  Physical Exam  BP 125/84   Pulse (!) 104   Temp 97.8 F (36.6 C) (Oral)   Resp 16   Ht 6\' 4"  (1.93 m)   Wt 86.2 kg   SpO2 97%   BMI 23.13 kg/m  Physical Exam General: NAD Cardiac: well-perfused Lungs: no resp distress Psych: calm  ED Course / MDM  EKG:   I have reviewed the labs performed to date as well as medications administered while in observation.  Recent changes in the last 24 hours include evaluated by psychiatry, deemed likely appropriate for discharge.  Plan  Current plan is for reeval w/ psychiatry on 06/03/23 here in ED.     Merdis Stalling, MD 06/03/23 [phone]

## 2023-06-03 NOTE — Progress Notes (Signed)
 Per Babara Lerner, pt has been accepted to [facility] on 06/03/2023 Room Assignment: Room 161  Pt meets inpatient criteria per:  Beaulah Bouquet, PMHNP  Attending Physician will be: Dr. Valentin Gaskins  Report can be called to: [phone]  Pt can arrive asap  Care Team Notified: Beaulah Bouquet, PMHNP, Annemarie Martone, RN, Minda Altes, RN    [place], [facility]

## 2023-06-03 NOTE — ED Notes (Signed)
 Patient admitted to [facility] from [facility] due to worsening depression with suicidal ideation to overdose on his medication.  Patient is homeless and using cocaine.  He was cooperative with admission process and was oriented to unit.  Patient given dinner.  Will monitor.

## 2023-06-03 NOTE — ED Notes (Signed)
Pt A&O x 4, no distress noted.  Monitoring for safety. 

## 2023-06-03 NOTE — ED Notes (Signed)
 Pt returned to room

## 2023-06-03 NOTE — ED Notes (Addendum)
 Gave pt his tray

## 2023-06-03 NOTE — ED Notes (Signed)
 Pt walked to desk to request "something for anxiety." Pt has returned to room.

## 2023-06-03 NOTE — Consult Note (Signed)
 [facility] Psychiatric Consult Initial  Patient Name: .Michael [name]  MRN: [id number]  DOB: 05/07/1975  Consult Order details:  Orders (From admission, onward)     Start     Ordered   06/02/23 2006  CONSULT TO CALL ACT TEAM       Ordering Provider: Guadalupe Lee, MD  Provider:  (Not yet assigned)  Question:  Reason for Consult?  Answer:  Psych consult   06/02/23 2005             Mode of Visit: In person    Psychiatry Consult Evaluation  Service Date: June 03, 2023 LOS:  LOS: 0 days  Chief Complaint The patient is a 48 year old male with history of depression and suicidal ideations, and cocaine use.   Primary Psychiatric Diagnoses  Major depressive disorder without psychotic features Generalized anxiety disorder Panic Disorder  Assessment  Michael [name] is a 48 y.o. male admitted: Presented to the ED on 06/02/2023  6:48 PM for for suicidal ideations. Patient feels that life is not worth living anymore. He carries the psychiatric diagnoses of major depressive disorder, and anxiety, panic disorder and has a past medical history of headaches, TGA.    His current presentation of hopelessness, low mood, changes in appetite and sleep is most consistent with known diagnosis of MDD. Current outpatient psychotropic medications include gabapentin , Atarax , Seroquel , Effexor , and trazodone  and historically he has had a positive response to these medications. He was non compliant with medications prior to admission as evidenced by patient report. On initial examination, patient pleasant and cooperative. Please see plan below for detailed recommendations.   Diagnoses:  Active Hospital problems: Principal Problem:   Major depressive disorder, recurrent severe without psychotic features (HCC) Active Problems:   GAD (generalized anxiety disorder)    Plan   ## Psychiatric Medication Recommendations:  Continue patient's home medications   ## Medical Decision Making Capacity: Not  specifically addressed in this encounter   ## Further Work-up:  -- No further workup needed at this time EKG or UDS -- EKG QTcB 437 ms -- Pertinent labwork reviewed earlier this admission includes: EKG, CBC, UDS, CMP     ## Disposition:-- We recommend patient be admitted to [facility]. Patient is under voluntary admission status at this time.   ## Behavioral / Environmental: -To minimize splitting of staff, assign one staff person to communicate all information from the team when feasible. or Utilize compassion and acknowledge the patient's experiences while setting clear and realistic expectations for care.                ## Safety and Observation Level:  - Based on my clinical evaluation, I estimate the patient to be at low risk of self harm in the current setting. - At this time, we recommend  routine. This decision is based on my review of the chart including patient's history and current presentation, interview of the patient, mental status examination, and consideration of suicide risk including evaluating suicidal ideation, plan, intent, suicidal or self-harm behaviors, risk factors, and protective factors. This judgment is based on our ability to directly address suicide risk, implement suicide prevention strategies, and develop a safety plan while the patient is in the clinical setting. Please contact our team if there is a concern that risk level has changed.   CSSR Risk Category:C-SSRS RISK CATEGORY: Low Risk   Suicide Risk Assessment: Patient has following modifiable risk factors for suicide: active suicidal ideation, untreated depression, social isolation, and medication noncompliance, which  we are addressing by recommending patient be admitted to [facility]. [name] Patient has following non-modifiable or demographic risk factors for suicide: male gender, separation or divorce, history of suicide attempt, and psychiatric hospitalization Patient has the following protective factors against suicide:  Supportive friends   Thank you for this consult request. Recommendations have been communicated to the primary team.  We recommend patient be admitted to [facility] and continue to follow patient at this time.   [name], PMHNP       History of Present Illness  Relevant Aspects of Hospital ED Course:  Admitted on 06/02/2023 for suicidal thoughts, depression, and cocaine use.    Patient Report:  "Pt is experiencing homelessness. Pt still feels depressed and anxious. Pt is having thoughts of killing himself by overdosing "that would be the easiest way to do it." Pt denies any previous attempt. He has no HI or A/V hallucinations. Patient admits to using cocaine after being discharged on 04/23. Patient feels like nothing matters. He describes losing everything and "not getting anything back." He feels like things keep getting worse. He says he has been taking his medications (d/c meds) as prescribed   Pt has poor eye contact but is oriented x4.  He cries at times during assessment.  Pt is not responding to internal stimuli.  Patient can barely be heard he talks so softly.  Patient says he has trouble getting to appointments and has no motivation to do anything. Patient has no current outpatient provider."   Psych ROS:  Depression: yes Anxiety:  yes Mania (lifetime and current): na Psychosis: (lifetime and current): na   Collateral information:  Contacted :NA, States he has no family members.    Review of Systems  Psychiatric/Behavioral:  Positive for depression, substance abuse and suicidal ideas.      Psychiatric and Social History  Psychiatric History:  Information collected from patient/Record Review   Prev Dx/Sx: MDD, Anxiety, Panic Disorder Current Psych Provider: na Home Meds (current): see above Previous Med Trials: Xanax , Mirtazapine , Effexor , Hydroxyzine . Therapy: Denies   Prior Psych Hospitalization: [facility], [facility]  Prior Self Harm: Denies Prior Violence: denies    Family Psych History: Depression Family Hx suicide: denies   Social History:  Developmental Hx: normal Educational Hx: GED Occupational Hx: Unemployed Legal Hx: denies Living Situation: homeless Spiritual Hx: denies Access to weapons/lethal means: na    Substance History Alcohol: Denies  Illicit drugs: Cannabis, Cocaine  Prescription drug abuse:  Denies Rehab hx: denies  Exam Findings  Physical Exam:  Vital Signs:  Temp:  [97.8 F (36.6 C)-98.9 F (37.2 C)] 97.8 F (36.6 C) (04/25 0556) Pulse Rate:  [104-125] 104 (04/25 0556) Resp:  [16-20] 16 (04/25 0556) BP: (120-162)/(81-110) 125/84 (04/25 0556) SpO2:  [95 %-99 %] 97 % (04/25 0556) Weight:  [86.2 kg] 86.2 kg (04/24 1851) Blood pressure 125/84, pulse (!) 104, temperature 97.8 F (36.6 C), temperature source Oral, resp. rate 16, height 6\' 4"  (1.93 m), weight 86.2 kg, SpO2 97%. Body mass index is 23.13 kg/m.  Physical Exam Vitals and nursing note reviewed. Exam conducted with a chaperone present.  Neurological:     Mental Status: He is alert.  Psychiatric:        Attention and Perception: Attention normal.        Mood and Affect: Affect is flat.        Speech: Speech normal.        Behavior: Behavior is cooperative.        Thought Content:  Thought content includes suicidal ideation.        Cognition and Memory: Cognition normal.     Mental Status Exam: General Appearance: Casual, Guarded, and Neat  Orientation:  Full (Time, Place, and Person)  Memory:  Immediate;   Good Recent;   Good Remote;   Good  Concentration:  Concentration: Good and Attention Span: Good  Recall:  Good  Attention  Fair  Eye Contact:  Fair  Speech:  Clear and Coherent and Normal Rate  Language:  Good  Volume:  Normal  Mood: "anxious, depressed"  Affect:  Congruent  Thought Process:  Coherent  Thought Content:  Logical  Suicidal Thoughts:  No  Homicidal Thoughts:  No  Judgement:  Fair  Insight:  Fair  Psychomotor Activity:   Psychomotor Retardation  Akathisia:  NA  Fund of Knowledge:  Fair     Assets:  Communication Skills  Cognition:  WNL  ADL's:  Intact  AIMS (if indicated):        Other History   These have been pulled in through the EMR, reviewed, and updated if appropriate.  Family History:  The patient's family history is not on file.  Medical History: Past Medical History:  Diagnosis Date   Anxiety    Depression    Panic attack    TGA (transient global amnesia)    TIA (transient ischemic attack)     Surgical History: No past surgical history on file.   Medications:   Current Facility-Administered Medications:    gabapentin  (NEURONTIN ) capsule 300 mg, 300 mg, Oral, TID, Steinl, Kevin, MD, 300 mg at 06/03/23 5573   hydrOXYzine  (ATARAX ) tablet 25 mg, 25 mg, Oral, TID PRN, Motley-Mangrum, P[place]oua Schreiner A, PMHNP, 25 mg at 06/03/23 1237   nicotine  (NICODERM CQ  - dosed in mg/24 hours) patch 14 mg, 14 mg, Transdermal, Daily, Steinl, Kevin, MD, 14 mg at 06/03/23 0900   QUEtiapine  (SEROQUEL ) tablet 100 mg, 100 mg, Oral, QHS, Steinl, Kevin, MD, 100 mg at 06/02/23 2114   traZODone  (DESYREL ) tablet 50 mg, 50 mg, Oral, QHS PRN, Steinl, Kevin, MD, 50 mg at 06/02/23 2115  Current Outpatient Medications:    acetaminophen  (TYLENOL ) 500 MG tablet, Take 500 mg by mouth every 6 (six) hours as needed for moderate pain (pain score 4-6)., Disp: , Rfl:    desvenlafaxine  (PRISTIQ ) 100 MG 24 hr tablet, Take 1 tablet (100 mg total) by mouth daily., Disp: 30 tablet, Rfl: 0   gabapentin  (NEURONTIN ) 300 MG capsule, Take 1 capsule (300 mg total) by mouth 3 (three) times daily., Disp: 90 capsule, Rfl: 0   nicotine  (NICODERM CQ  - DOSED IN MG/24 HOURS) 14 mg/24hr patch, Place 1 patch (14 mg total) onto the skin daily., Disp: 28 patch, Rfl: 0   QUEtiapine  (SEROQUEL ) 100 MG tablet, Take 1 tablet (100 mg total) by mouth at bedtime., Disp: 30 tablet, Rfl: 0   traZODone  (DESYREL ) 50 MG tablet, Take 1 tablet (50 mg total) by mouth at  bedtime as needed for sleep., Disp: 30 tablet, Rfl: 0  Allergies: No Known Allergies  Brooklen Runquist MOTLEY-MANGRUM, PMHNP

## 2023-06-03 NOTE — ED Notes (Signed)
 Pt ambulated to restroom.
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
## 2023-06-03 NOTE — ED Notes (Signed)
 Pt sleeping at present, no distress noted, monitoring for safety.

## 2023-06-04 DIAGNOSIS — F329 Major depressive disorder, single episode, unspecified: Secondary | ICD-10-CM | POA: Diagnosis not present

## 2023-06-04 DIAGNOSIS — F1010 Alcohol abuse, uncomplicated: Secondary | ICD-10-CM | POA: Diagnosis not present

## 2023-06-04 DIAGNOSIS — F1410 Cocaine abuse, uncomplicated: Secondary | ICD-10-CM | POA: Diagnosis not present

## 2023-06-04 DIAGNOSIS — R45851 Suicidal ideations: Secondary | ICD-10-CM | POA: Diagnosis not present

## 2023-06-04 MED ORDER — GABAPENTIN 300 MG PO CAPS
300.0000 mg | ORAL_CAPSULE | Freq: Three times a day (TID) | ORAL | Status: DC
  Administered 2023-06-04 – 2023-06-07 (×12): 300 mg via ORAL
  Filled 2023-06-04 (×2): qty 1
  Filled 2023-06-04: qty 21
  Filled 2023-06-04 (×10): qty 1

## 2023-06-04 MED ORDER — QUETIAPINE FUMARATE 100 MG PO TABS
100.0000 mg | ORAL_TABLET | Freq: Every day | ORAL | Status: DC
  Administered 2023-06-04 – 2023-06-07 (×4): 100 mg via ORAL
  Filled 2023-06-04: qty 7
  Filled 2023-06-04 (×4): qty 1

## 2023-06-04 MED ORDER — TRAZODONE HCL 50 MG PO TABS
50.0000 mg | ORAL_TABLET | Freq: Every evening | ORAL | Status: DC | PRN
  Administered 2023-06-05 – 2023-06-07 (×2): 50 mg via ORAL
  Filled 2023-06-04 (×3): qty 1
  Filled 2023-06-04: qty 14
  Filled 2023-06-04: qty 1

## 2023-06-04 MED ORDER — VENLAFAXINE HCL ER 150 MG PO CP24
150.0000 mg | ORAL_CAPSULE | Freq: Every day | ORAL | Status: DC
  Administered 2023-06-05 – 2023-06-07 (×3): 150 mg via ORAL
  Filled 2023-06-04 (×2): qty 1
  Filled 2023-06-04: qty 7
  Filled 2023-06-04: qty 1

## 2023-06-04 MED ORDER — NICOTINE 21 MG/24HR TD PT24
21.0000 mg | MEDICATED_PATCH | Freq: Every day | TRANSDERMAL | Status: DC
  Administered 2023-06-04 – 2023-06-07 (×4): 21 mg via TRANSDERMAL
  Filled 2023-06-04 (×3): qty 1
  Filled 2023-06-04: qty 14
  Filled 2023-06-04: qty 1

## 2023-06-04 NOTE — ED Notes (Signed)
 Patient is awake and alert on unit.  He is pleasant and organized.  Patient makes needs known.  No distress.  No withdrawal.  He is sitting in dayroom socializing with select peers.  Will monitor.

## 2023-06-04 NOTE — ED Notes (Signed)
 Patient is in the bedroom sleeping with eyes closed, NAD. Will continue to monitor for safety.

## 2023-06-04 NOTE — Group Note (Signed)
 Group Topic: Communication  Group Date: 06/03/2023 Start Time: 2000 End Time: 2030 Facilitators: Dee Farber D, NT  Department: [facility]  Number of Participants: 3  Group Focus: communication Treatment Modality:  Psychoeducation Interventions utilized were support Purpose: express feelings  Name: Michael [name] Date of Birth: [birth year]  MR: [id number]    Level of Participation: moderate Quality of Participation: attentive Interactions with others: gave feedback Mood/Affect: appropriate Triggers (if applicable): n/a Cognition: coherent/clear Progress: Significant Response: n/a Plan: follow-up needed  Patients Problems:  Patient Active Problem List   Diagnosis Date Noted   MDD (major depressive disorder), recurrent severe, without psychosis (HCC) 05/26/2023   MDD (major depressive disorder) 03/13/2023   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

## 2023-06-04 NOTE — ED Notes (Signed)
 Pt sleeping at present, no distress noted.  Monitoring for safety.

## 2023-06-04 NOTE — ED Notes (Signed)
 Patient is sitting in the dayroom room watching TV with other patients. Calm and pleasant. NAD respirations are even and unlabored.Will continue to monitor for safety.

## 2023-06-04 NOTE — Group Note (Signed)
 Group Topic: Relapse and Recovery  Group Date: 06/04/2023 Start Time: 1210 End Time: 1245 Facilitators: Arlan Belling, RN  Department: [facility]  Number of Participants: 9  Group Focus: chemical dependency education, chemical dependency issues, clarity of thought, co-dependency, and coping skills Treatment Modality:  Behavior Modification Therapy Interventions utilized were clarification, exploration, patient education, and problem solving Purpose: enhance coping skills, explore maladaptive thinking, express feelings, express irrational fears, increase insight, regain self-worth, reinforce self-care, relapse prevention strategies, and trigger / craving management  Name: Michael [name] Date of Birth: 1975/04/11  MR: [id number]    Level of Participation: active Quality of Participation: attentive and cooperative Interactions with others: gave feedback Mood/Affect: appropriate Triggers (if applicable):   Cognition: coherent/clear Progress: Gaining insight Response:   Plan: follow-up needed  Patients Problems:  Patient Active Problem List   Diagnosis Date Noted   MDD (major depressive disorder), recurrent severe, without psychosis (HCC) 05/26/2023   MDD (major depressive disorder) 03/13/2023   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

## 2023-06-04 NOTE — Group Note (Signed)
 Group Topic: Relapse and Recovery  Group Date: 06/04/2023 Start Time: 2000 End Time: 2100 Facilitators: Wendall Halls B  Department: [facility]  Number of Participants: 7  Group Focus: abuse issues, activities of daily living skills, chemical dependency education, chemical dependency issues, community group, dual diagnosis, and goals/reality orientation Treatment Modality:  Leisure Counsellor and Psychoeducation Interventions utilized were leisure development, patient education, story telling, and support Purpose: enhance coping skills, express feelings, increase insight, relapse prevention strategies, and trigger / craving management  Name: Michael [name] Date of Birth: May 18, 1975  MR: [id number]    Level of Participation: active Quality of Participation: attentive and cooperative Interactions with others: gave feedback Mood/Affect: appropriate Triggers (if applicable): NA Cognition: coherent/clear Progress: Gaining insight Response: NA Plan: patient will be encouraged to keep going to groups.   Patients Problems:  Patient Active Problem List   Diagnosis Date Noted   MDD (major depressive disorder), recurrent severe, without psychosis (HCC) 05/26/2023   MDD (major depressive disorder) 03/13/2023   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

## 2023-06-04 NOTE — ED Provider Notes (Deleted)
 Behavioral Health Progress Note  Date and Time: 06/04/2023 1:33 PM Name: Michael [name] MRN:  [id number]  Subjective:   The patient is a 48 year old male with a recent psychiatric hospitalization.  At that hospitalization he presented with suicidal thoughts and was diagnosed with major depressive disorder.  Soon after discharge, he presented to the emergency department reporting suicidal thoughts.  He was admitted to the facility based crisis for further treatment.  The patient feels he was doing fairly well in life until the COVID-19 pandemic.  He feels the pandemic led to significant problems in his life.  He talks about the breakdown of his family relationships.  He reports some connection to his 27 year old daughter, and he says he wants to be sober so that he can be involved in her life.  The patient reports frequent use of alcohol and cocaine.  He denies experiencing any withdrawal presently.  He denies history of severe withdrawal symptoms.  The patient reports significant depressive symptoms.  He reports continued passive suicidal thoughts.  He contracts for safety on the unit.  Diagnosis:  Final diagnoses:  Alcohol use disorder  Other depression    Total Time spent with patient: 15 minutes  Past Psychiatric History: See H&P and above Past Medical History: See H&P and above Family History: See H&P and above Family Psychiatric  History: See H&P and above Social History: See H&P and above  Additional Social History:  none  Sleep: fair  Appetite:  fair  Current Medications:  Current Facility-Administered Medications  Medication Dose Route Frequency Provider Last Rate Last Admin   acetaminophen  (TYLENOL ) tablet 650 mg  650 mg Oral Q6H PRN Motley-Mangrum, Jadeka A, PMHNP   650 mg at 06/03/23 2004   alum & mag hydroxide-simeth (MAALOX/MYLANTA) 200-200-20 MG/5ML suspension 30 mL  30 mL Oral Q4H PRN Motley-Mangrum, Jadeka A, PMHNP       haloperidol  (HALDOL ) tablet 5 mg  5 mg  Oral TID PRN Motley-Mangrum, Jadeka A, PMHNP       And   diphenhydrAMINE  (BENADRYL ) capsule 50 mg  50 mg Oral TID PRN Motley-Mangrum, Jadeka A, PMHNP       haloperidol  lactate (HALDOL ) injection 5 mg  5 mg Intramuscular TID PRN Motley-Mangrum, Jadeka A, PMHNP       And   diphenhydrAMINE  (BENADRYL ) injection 50 mg  50 mg Intramuscular TID PRN Motley-Mangrum, Jadeka A, PMHNP       And   LORazepam  (ATIVAN ) injection 2 mg  2 mg Intramuscular TID PRN Motley-Mangrum, Jadeka A, PMHNP       haloperidol  lactate (HALDOL ) injection 10 mg  10 mg Intramuscular TID PRN Motley-Mangrum, Jadeka A, PMHNP       And   diphenhydrAMINE  (BENADRYL ) injection 50 mg  50 mg Intramuscular TID PRN Motley-Mangrum, Jadeka A, PMHNP       And   LORazepam  (ATIVAN ) injection 2 mg  2 mg Intramuscular TID PRN Motley-Mangrum, Jadeka A, PMHNP       gabapentin  (NEURONTIN ) capsule 300 mg  300 mg Oral TID Marilou Showman, MD   300 mg at 06/04/23 1055   magnesium  hydroxide (MILK OF MAGNESIA) suspension 30 mL  30 mL Oral Daily PRN Motley-Mangrum, Jadeka A, PMHNP       nicotine  (NICODERM CQ  - dosed in mg/24 hours) patch 21 mg  21 mg Transdermal Daily Marilou Showman, MD   21 mg at 06/04/23 1055   QUEtiapine  (SEROQUEL ) tablet 100 mg  100 mg Oral QHS Marilou Showman, MD  traZODone  (DESYREL ) tablet 50 mg  50 mg Oral QHS PRN Motley-Mangrum, Jadeka A, PMHNP   50 mg at 06/03/23 2004   traZODone  (DESYREL ) tablet 50 mg  50 mg Oral QHS PRN Marilou Showman, MD       Cecily Cohen ON 06/05/2023] venlafaxine  XR (EFFEXOR -XR) 24 hr capsule 150 mg  150 mg Oral Q breakfast Marilou Showman, MD       Current Outpatient Medications  Medication Sig Dispense Refill   acetaminophen  (TYLENOL ) 500 MG tablet Take 500 mg by mouth every 6 (six) hours as needed for moderate pain (pain score 4-6).     desvenlafaxine  (PRISTIQ ) 100 MG 24 hr tablet Take 1 tablet (100 mg total) by mouth daily. 30 tablet 0   gabapentin  (NEURONTIN ) 300 MG capsule Take 1 capsule (300 mg  total) by mouth 3 (three) times daily. 90 capsule 0   nicotine  (NICODERM CQ  - DOSED IN MG/24 HOURS) 14 mg/24hr patch Place 1 patch (14 mg total) onto the skin daily. 28 patch 0   QUEtiapine  (SEROQUEL ) 100 MG tablet Take 1 tablet (100 mg total) by mouth at bedtime. 30 tablet 0   traZODone  (DESYREL ) 50 MG tablet Take 1 tablet (50 mg total) by mouth at bedtime as needed for sleep. 30 tablet 0    Labs  Lab Results:  Admission on 06/02/2023, Discharged on 06/03/2023  Component Date Value Ref Range Status   Sodium 06/02/2023 133 (L)  135 - 145 mmol/L Final   Potassium 06/02/2023 4.0  3.5 - 5.1 mmol/L Final   Chloride 06/02/2023 96 (L)  98 - 111 mmol/L Final   CO2 06/02/2023 25  22 - 32 mmol/L Final   Glucose, Bld 06/02/2023 111 (H)  70 - 99 mg/dL Final   Glucose reference range applies only to samples taken after fasting for at least 8 hours.   BUN 06/02/2023 21 (H)  6 - 20 mg/dL Final   Creatinine, Ser 06/02/2023 0.93  0.61 - 1.24 mg/dL Final   Calcium 95/62/1308 9.2  8.9 - 10.3 mg/dL Final   Total Protein 65/78/4696 8.7 (H)  6.5 - 8.1 g/dL Final   Albumin 29/52/8413 4.5  3.5 - 5.0 g/dL Final   AST 24/40/1027 18  15 - 41 U/L Final   ALT 06/02/2023 15  0 - 44 U/L Final   Alkaline Phosphatase 06/02/2023 82  38 - 126 U/L Final   Total Bilirubin 06/02/2023 1.0  0.0 - 1.2 mg/dL Final   GFR, Estimated 06/02/2023 >60  >60 mL/min Final   Comment: (NOTE) Calculated using the CKD-EPI Creatinine Equation (2021)    Anion gap 06/02/2023 12  5 - 15 Final   Performed at [facility], [address] [place], [place] [address]   Alcohol, Ethyl (B) 06/02/2023 <15  <15 mg/dL Final   Comment: Please note change in reference range. (NOTE) For medical purposes only. Performed at [facility], [address] [place], [place] [address]    WBC 06/02/2023 12.2 (H)  4.0 - 10.5 K/uL Final   RBC 06/02/2023 4.96  4.22 - 5.81 MIL/uL Final   Hemoglobin 06/02/2023 15.4  13.0  - 17.0 g/dL Final   HCT 74/25/9563 47.0  39.0 - 52.0 % Final   MCV 06/02/2023 94.8  80.0 - 100.0 fL Final   MCH 06/02/2023 31.0  26.0 - 34.0 pg Final   MCHC 06/02/2023 32.8  30.0 - 36.0 g/dL Final   RDW 87/56/4332 13.1  11.5 - 15.5 % Final   Platelets  06/02/2023 352  150 - 400 K/uL Final   nRBC 06/02/2023 0.0  0.0 - 0.2 % Final   Performed at [facility], [address] [place], [place] [address]   Opiates 06/02/2023 NONE DETECTED  NONE DETECTED Final   Cocaine 06/02/2023 POSITIVE (A)  NONE DETECTED Final   Benzodiazepines 06/02/2023 NONE DETECTED  NONE DETECTED Final   Amphetamines 06/02/2023 NONE DETECTED  NONE DETECTED Final   Tetrahydrocannabinol 06/02/2023 NONE DETECTED  NONE DETECTED Final   Barbiturates 06/02/2023 NONE DETECTED  NONE DETECTED Final   Comment: (NOTE) DRUG SCREEN FOR MEDICAL PURPOSES ONLY.  IF CONFIRMATION IS NEEDED FOR ANY PURPOSE, NOTIFY LAB WITHIN 5 DAYS.  LOWEST DETECTABLE LIMITS FOR URINE DRUG SCREEN Drug Class                     Cutoff (ng/mL) Amphetamine and metabolites    1000 Barbiturate and metabolites    200 Benzodiazepine                 200 Opiates and metabolites        300 Cocaine and metabolites        300 THC                            50 Performed at [facility], [address] [place], [place] [address]   Admission on 05/26/2023, Discharged on 05/31/2023  Component Date Value Ref Range Status   Folate 05/27/2023 9.9  >5.9 ng/mL Final   Performed at [facility], [address], [place], [place] [address]   RPR Ser Ql 05/27/2023 NON REACTIVE  NON REACTIVE Final   Performed at [facility], [address] [place], [place] [address]   TSH 05/27/2023 2.185  0.350 - 4.500 uIU/mL Final   Comment: Performed by a 3rd Generation assay with a functional sensitivity of <=0.01 uIU/mL. Performed at [facility], [address] [place], [place] [address]     Vitamin B-12 05/27/2023 173 (L)  180 - 914 pg/mL Final   Comment: (NOTE) This assay is not validated for testing neonatal or myeloproliferative syndrome specimens for Vitamin B12 levels. Performed at [facility], [address] [place], [place] [address]    Vit D, 25-Hydroxy 05/27/2023 35.74  30 - 100 ng/mL Final   Comment: (NOTE) Vitamin D  deficiency has been defined by the Institute of Medicine  and an Endocrine Society practice guideline as a level of serum 25-OH  vitamin D  less than 20 ng/mL (1,2). The Endocrine Society went on to  further define vitamin D  insufficiency as a level between 21 and 29  ng/mL (2).  1. IOM (Institute of Medicine). 2010. Dietary reference intakes for  calcium and D. [place]: [employer]. 2. Holick MF, Binkley [place], Bischoff-Ferrari HA, et al. Evaluation,  treatment, and prevention of vitamin D  deficiency: an Endocrine  Society clinical practice guideline, JCEM. 2011 Jul; 96(7): 1911-30.  Performed at [facility], [address] [place], [place] [address]    Sodium 05/27/2023 138  135 - 145 mmol/L Final   Potassium 05/27/2023 3.8  3.5 - 5.1 mmol/L Final   Chloride 05/27/2023 103  98 - 111 mmol/L Final   CO2 05/27/2023 25  22 - 32 mmol/L Final   Glucose, Bld 05/27/2023 109 (H)  70 - 99 mg/dL Final   Glucose reference range applies  only to samples taken after fasting for at least 8 hours.   BUN 05/27/2023 13  6 - 20 mg/dL Final   Creatinine, Ser 05/27/2023 0.91  0.61 - 1.24 mg/dL Final   Calcium 13/09/6576 8.8 (L)  8.9 - 10.3 mg/dL Final   GFR, Estimated 05/27/2023 >60  >60 mL/min Final   Comment: (NOTE) Calculated using the CKD-EPI Creatinine Equation (2021)    Anion gap 05/27/2023 10  5 - 15 Final   Performed at [facility], [address], [place], [place] [address]  Admission on 05/26/2023, Discharged on 05/26/2023  Component Date Value Ref Range Status   Sodium 05/26/2023  138  135 - 145 mmol/L Final   Potassium 05/26/2023 3.1 (L)  3.5 - 5.1 mmol/L Final   Chloride 05/26/2023 101  98 - 111 mmol/L Final   CO2 05/26/2023 25  22 - 32 mmol/L Final   Glucose, Bld 05/26/2023 98  70 - 99 mg/dL Final   Glucose reference range applies only to samples taken after fasting for at least 8 hours.   BUN 05/26/2023 11  6 - 20 mg/dL Final   Creatinine, Ser 05/26/2023 0.57 (L)  0.61 - 1.24 mg/dL Final   Calcium 95/28/4132 8.7 (L)  8.9 - 10.3 mg/dL Final   Total Protein 44/02/270 7.6  6.5 - 8.1 g/dL Final   Albumin 53/66/4403 4.2  3.5 - 5.0 g/dL Final   AST 47/42/5956 18  15 - 41 U/L Final   ALT 05/26/2023 14  0 - 44 U/L Final   Alkaline Phosphatase 05/26/2023 55  38 - 126 U/L Final   Total Bilirubin 05/26/2023 0.9  0.0 - 1.2 mg/dL Final   GFR, Estimated 05/26/2023 >60  >60 mL/min Final   Comment: (NOTE) Calculated using the CKD-EPI Creatinine Equation (2021)    Anion gap 05/26/2023 12  5 - 15 Final   Performed at [facility], [address], [place], [place] [address]   Alcohol, Ethyl (B) 05/26/2023 <10  <10 mg/dL Final   Comment: (NOTE) For medical purposes only. Performed at [facility], [address], [place], [place] [address]    Opiates 05/26/2023 NONE DETECTED  NONE DETECTED Final   Cocaine 05/26/2023 NONE DETECTED  NONE DETECTED Final   Benzodiazepines 05/26/2023 NONE DETECTED  NONE DETECTED Final   Amphetamines 05/26/2023 NONE DETECTED  NONE DETECTED Final   Tetrahydrocannabinol 05/26/2023 NONE DETECTED  NONE DETECTED Final   Barbiturates 05/26/2023 NONE DETECTED  NONE DETECTED Final   Comment: (NOTE) DRUG SCREEN FOR MEDICAL PURPOSES ONLY.  IF CONFIRMATION IS NEEDED FOR ANY PURPOSE, NOTIFY LAB WITHIN 5 DAYS.  LOWEST DETECTABLE LIMITS FOR URINE DRUG SCREEN Drug Class                     Cutoff (ng/mL) Amphetamine and metabolites    1000 Barbiturate and metabolites    200 Benzodiazepine                  200 Opiates and metabolites        300 Cocaine and metabolites        300 THC                            50 Performed at [facility], [address], [place], [place] [address]    WBC 05/26/2023 6.9  4.0 - 10.5 K/uL Final   RBC 05/26/2023 4.46  4.22 -  5.81 MIL/uL Final   Hemoglobin 05/26/2023 14.0  13.0 - 17.0 g/dL Final   HCT 16/11/9602 42.3  39.0 - 52.0 % Final   MCV 05/26/2023 94.8  80.0 - 100.0 fL Final   MCH 05/26/2023 31.4  26.0 - 34.0 pg Final   MCHC 05/26/2023 33.1  30.0 - 36.0 g/dL Final   RDW 54/10/8117 13.2  11.5 - 15.5 % Final   Platelets 05/26/2023 276  150 - 400 K/uL Final   nRBC 05/26/2023 0.0  0.0 - 0.2 % Final   Neutrophils Relative % 05/26/2023 58  % Final   Neutro Abs 05/26/2023 4.0  1.7 - 7.7 K/uL Final   Lymphocytes Relative 05/26/2023 31  % Final   Lymphs Abs 05/26/2023 2.1  0.7 - 4.0 K/uL Final   Monocytes Relative 05/26/2023 8  % Final   Monocytes Absolute 05/26/2023 0.5  0.1 - 1.0 K/uL Final   Eosinophils Relative 05/26/2023 2  % Final   Eosinophils Absolute 05/26/2023 0.1  0.0 - 0.5 K/uL Final   Basophils Relative 05/26/2023 1  % Final   Basophils Absolute 05/26/2023 0.1  0.0 - 0.1 K/uL Final   Immature Granulocytes 05/26/2023 0  % Final   Abs Immature Granulocytes 05/26/2023 0.02  0.00 - 0.07 K/uL Final   Performed at [facility], [address] [place], [place] [address]  Admission on 03/28/2023, Discharged on 04/04/2023  Component Date Value Ref Range Status   TSH 03/30/2023 2.019  0.350 - 4.500 uIU/mL Final   Comment: Performed by a 3rd Generation assay with a functional sensitivity of <=0.01 uIU/mL. Performed at [facility], [address] [place], [place] [address]    Hgb A1c MFr Bld 03/30/2023 5.2  4.8 - 5.6 % Final   Comment: (NOTE) Pre diabetes:          5.7%-6.4%  Diabetes:              >6.4%  Glycemic control for   <7.0% adults with diabetes    Mean Plasma Glucose 03/30/2023  102.54  mg/dL Final   Performed at [facility], [address] [place], [place] [address]   Cholesterol 03/30/2023 254 (H)  0 - 200 mg/dL Final   Triglycerides 78/46/9629 144  <150 mg/dL Final   HDL 52/84/1324 65  >40 mg/dL Final   Total CHOL/HDL Ratio 03/30/2023 3.9  RATIO Final   VLDL 03/30/2023 29  0 - 40 mg/dL Final   LDL Cholesterol 03/30/2023 160 (H)  0 - 99 mg/dL Final   Comment:        Total Cholesterol/HDL:CHD Risk Coronary Heart Disease Risk Table                     Men   Women  1/2 Average Risk   3.4   3.3  Average Risk       5.0   4.4  2 X Average Risk   9.6   7.1  3 X Average Risk  23.4   11.0        Use the calculated Patient Ratio above and the CHD Risk Table to determine the patient's CHD Risk.        ATP III CLASSIFICATION (LDL):  <100     mg/dL   Optimal  401-027  mg/dL   Near or Above                    Optimal  130-159  mg/dL  Borderline  160-189  mg/dL   High  >161     mg/dL   Very High Performed at [facility], [address] [place], [place] [address]   Admission on 03/28/2023, Discharged on 03/28/2023  Component Date Value Ref Range Status   Sodium 03/28/2023 139  135 - 145 mmol/L Final   Potassium 03/28/2023 3.4 (L)  3.5 - 5.1 mmol/L Final   Chloride 03/28/2023 103  98 - 111 mmol/L Final   CO2 03/28/2023 24  22 - 32 mmol/L Final   Glucose, Bld 03/28/2023 106 (H)  70 - 99 mg/dL Final   Glucose reference range applies only to samples taken after fasting for at least 8 hours.   BUN 03/28/2023 6  6 - 20 mg/dL Final   Creatinine, Ser 03/28/2023 0.75  0.61 - 1.24 mg/dL Final   Calcium 54/10/8117 8.8 (L)  8.9 - 10.3 mg/dL Final   Total Protein 14/78/2956 7.4  6.5 - 8.1 g/dL Final   Albumin 21/30/8657 3.8  3.5 - 5.0 g/dL Final   AST 84/69/6295 18  15 - 41 U/L Final   ALT 03/28/2023 15  0 - 44 U/L Final   Alkaline Phosphatase 03/28/2023 55  38 - 126 U/L Final   Total Bilirubin 03/28/2023 0.4  0.0 - 1.2 mg/dL Final   GFR,  Estimated 03/28/2023 >60  >60 mL/min Final   Comment: (NOTE) Calculated using the CKD-EPI Creatinine Equation (2021)    Anion gap 03/28/2023 12  5 - 15 Final   Performed at [facility], [address] [place], [place] [address]   Alcohol, Ethyl (B) 03/28/2023 14 (H)  <10 mg/dL Final   Comment: (NOTE) Lowest detectable limit for serum alcohol is 10 mg/dL.  For medical purposes only. Performed at [facility], [address], [place], [place] [address]    Salicylate Lvl 03/28/2023 <7.0 (L)  7.0 - 30.0 mg/dL Final   Performed at [facility], [address], [place], [place] [address]   Acetaminophen  (Tylenol ), Serum 03/28/2023 <10 (L)  10 - 30 ug/mL Final   Comment: (NOTE) Therapeutic concentrations vary significantly. A range of 10-30 ug/mL  may be an effective concentration for many patients. However, some  are best treated at concentrations outside of this range. Acetaminophen  concentrations >150 ug/mL at 4 hours after ingestion  and >50 ug/mL at 12 hours after ingestion are often associated with  toxic reactions.  Performed at [facility], [address] [place], [place] [address]    WBC 03/28/2023 6.1  4.0 - 10.5 K/uL Final   RBC 03/28/2023 4.55  4.22 - 5.81 MIL/uL Final   Hemoglobin 03/28/2023 14.2  13.0 - 17.0 g/dL Final   HCT 03/47/4259 42.1  39.0 - 52.0 % Final   MCV 03/28/2023 92.5  80.0 - 100.0 fL Final   MCH 03/28/2023 31.2  26.0 - 34.0 pg Final   MCHC 03/28/2023 33.7  30.0 - 36.0 g/dL Final   RDW 56/38/7564 12.8  11.5 - 15.5 % Final   Platelets 03/28/2023 383  150 - 400 K/uL Final   nRBC 03/28/2023 0.0  0.0 - 0.2 % Final   Performed at [facility], [address], [place], [place] [address]   Opiates 03/28/2023 NONE DETECTED  NONE DETECTED Final   Cocaine 03/28/2023 NONE DETECTED  NONE DETECTED Final   Benzodiazepines 03/28/2023 NONE DETECTED  NONE DETECTED Final    Amphetamines 03/28/2023 NONE DETECTED  NONE DETECTED  Final   Tetrahydrocannabinol 03/28/2023 POSITIVE (A)  NONE DETECTED Final   Barbiturates 03/28/2023 NONE DETECTED  NONE DETECTED Final   Comment: (NOTE) DRUG SCREEN FOR MEDICAL PURPOSES ONLY.  IF CONFIRMATION IS NEEDED FOR ANY PURPOSE, NOTIFY LAB WITHIN 5 DAYS.  LOWEST DETECTABLE LIMITS FOR URINE DRUG SCREEN Drug Class                     Cutoff (ng/mL) Amphetamine and metabolites    1000 Barbiturate and metabolites    200 Benzodiazepine                 200 Opiates and metabolites        300 Cocaine and metabolites        300 THC                            50 Performed at [facility], [address] [place], [place] [address]   Admission on 03/13/2023, Discharged on 03/13/2023  Component Date Value Ref Range Status   Sodium 03/13/2023 135  135 - 145 mmol/L Final   Potassium 03/13/2023 3.9  3.5 - 5.1 mmol/L Final   Chloride 03/13/2023 99  98 - 111 mmol/L Final   CO2 03/13/2023 28  22 - 32 mmol/L Final   Glucose, Bld 03/13/2023 120 (H)  70 - 99 mg/dL Final   Glucose reference range applies only to samples taken after fasting for at least 8 hours.   BUN 03/13/2023 5 (L)  6 - 20 mg/dL Final   Creatinine, Ser 03/13/2023 0.70  0.61 - 1.24 mg/dL Final   Calcium 60/45/4098 8.7 (L)  8.9 - 10.3 mg/dL Final   Total Protein 11/91/4782 7.1  6.5 - 8.1 g/dL Final   Albumin 95/62/1308 4.3  3.5 - 5.0 g/dL Final   AST 65/78/4696 22  15 - 41 U/L Final   ALT 03/13/2023 11  0 - 44 U/L Final   Alkaline Phosphatase 03/13/2023 55  38 - 126 U/L Final   Total Bilirubin 03/13/2023 0.8  0.0 - 1.2 mg/dL Final   GFR, Estimated 03/13/2023 >60  >60 mL/min Final   Comment: (NOTE) Calculated using the CKD-EPI Creatinine Equation (2021)    Anion gap 03/13/2023 8  5 - 15 Final   Performed at [facility], [address], [place], [place] [address]   Alcohol, Ethyl (B) 03/13/2023 <10  <10 mg/dL Final   Comment:  (NOTE) Lowest detectable limit for serum alcohol is 10 mg/dL.  For medical purposes only. Performed at [facility], [address], [place], [place] [address]    WBC 03/13/2023 10.9 (H)  4.0 - 10.5 K/uL Final   RBC 03/13/2023 4.81  4.22 - 5.81 MIL/uL Final   Hemoglobin 03/13/2023 14.8  13.0 - 17.0 g/dL Final   HCT 40/11/2723 43.5  39.0 - 52.0 % Final   MCV 03/13/2023 90.4  80.0 - 100.0 fL Final   MCH 03/13/2023 30.8  26.0 - 34.0 pg Final   MCHC 03/13/2023 34.0  30.0 - 36.0 g/dL Final   RDW 36/64/4034 12.2  11.5 - 15.5 % Final   Platelets 03/13/2023 288  150 - 400 K/uL Final   nRBC 03/13/2023 0.0  0.0 - 0.2 % Final   Performed at [facility], [address], [place], [place] [address]   Opiates 03/13/2023 NONE DETECTED  NONE DETECTED Final   Cocaine 03/13/2023 NONE DETECTED  NONE DETECTED Final   Benzodiazepines 03/13/2023 POSITIVE (A)  NONE DETECTED Final   Amphetamines 03/13/2023 NONE DETECTED  NONE DETECTED Final   Tetrahydrocannabinol 03/13/2023 POSITIVE (A)  NONE DETECTED Final   Barbiturates 03/13/2023 NONE DETECTED  NONE DETECTED Final   Comment: (NOTE) DRUG SCREEN FOR MEDICAL PURPOSES ONLY.  IF CONFIRMATION IS NEEDED FOR ANY PURPOSE, NOTIFY LAB WITHIN 5 DAYS.  LOWEST DETECTABLE LIMITS FOR URINE DRUG SCREEN Drug Class                     Cutoff (ng/mL) Amphetamine and metabolites    1000 Barbiturate and metabolites    200 Benzodiazepine                 200 Opiates and metabolites        300 Cocaine and metabolites        300 THC                            50 Performed at [facility], [address], [place], [place] [address]     Blood Alcohol level:  Lab Results  Component Value Date   [facility] <15 06/02/2023   ETH <10 05/26/2023    Metabolic Disorder Labs: Lab Results  Component Value Date   HGBA1C 5.2 03/30/2023   MPG 102.54 03/30/2023   No results found for: "PROLACTIN" Lab Results  Component  Value Date   CHOL 254 (H) 03/30/2023   TRIG 144 03/30/2023   HDL 65 03/30/2023   CHOLHDL 3.9 03/30/2023   VLDL 29 03/30/2023   LDLCALC 160 (H) 03/30/2023    Therapeutic Lab Levels: No results found for: "LITHIUM" No results found for: "VALPROATE" No results found for: "CBMZ"  Physical Findings   AUDIT    Flowsheet Row [facility]) [facility]-Admission (Discharged) [facility]  Alcohol Use Disorder Identification Test Final Score (AUDIT) 0 0      PHQ2-9    Flowsheet Row ED from 06/03/2023 in [facility] ED from 05/26/2023 in [facility] ED from 03/13/2023 in [facility]  PHQ-2 Total Score 6 4 4   PHQ-9 Total Score 25 15 13       Flowsheet Row ED from 06/03/2023 in [facility] from 06/02/2023 in [facility] Admission (Discharged) [facility]  C-SSRS RISK CATEGORY High Risk High Risk Low Risk        Psychiatric Specialty Exam: Physical Exam Constitutional:      Appearance: the patient is not toxic-appearing.  Pulmonary:     Effort: Pulmonary effort is normal.  Neurological:     General: No focal deficit present.     Mental Status: the patient is alert and oriented to person, place, and time.   Review of Systems  Respiratory:  Negative for shortness of breath.   Cardiovascular:  Negative for chest pain.  Gastrointestinal:  Negative for abdominal pain, constipation, diarrhea, nausea and vomiting.  Neurological:  Negative for headaches.      BP 108/75   Pulse 79   Temp 97.7 F (36.5 C) (Oral)   Resp 18   SpO2 97%   General Appearance: Fairly Groomed  Eye Contact:  Good  Speech:  Clear and Coherent  Volume:  Normal   Mood: Depressed  Affect:  Congruent  Thought Process:  Coherent  Orientation:  Full (Time, Place, and Person)  Thought Content: Logical   Suicidal Thoughts:  No  Homicidal Thoughts:  No  Memory:  Immediate;   Good  Judgement:  fair  Insight:  fair  Psychomotor Activity:  Normal  Concentration:  Concentration: Good  Recall:  Good  Fund of Knowledge: Good  Language: Good  Akathisia:  No  Handed:  not assessed  AIMS (if indicated): not done  Assets:  Communication Skills Desire for Improvement Leisure Time Physical Health  ADL's:  Intact  Cognition: WNL  Sleep:  Fair    Treatment Plan Summary: Daily contact with patient to assess and evaluate symptoms and progress in treatment and Medication management.  Alcohol use disorder, cocaine abuse - No withdrawal currently - No previous substance use treatment history - Restart gabapentin  for anxiety and alcohol cravings - Residential rehab placement  Depression - Unable to prescribe Pristiq , start Effexor  150 mg - Start Seroquel  100 mg  Lab work and EKG unremarkable: Sodium of 133 and white blood cells of 12  Marilou Showman, MD 06/04/2023 1:33 PM

## 2023-06-04 NOTE — Group Note (Signed)
 Group Topic: Healthy Self Image and Positive Change  Group Date: 06/04/2023 Start Time: 1700 End Time: 1730 Facilitators: Elfredia Grippe, NT  Department: [facility]  Number of Participants: 9  Group Focus: affirmation Treatment Modality:  Psychoeducation Interventions utilized were group exercise Purpose: regain self-worth  Name: Michael [name] Date of Birth: 05-Feb-1976  MR: [id number]    Level of Participation: active Quality of Participation: attentive Interactions with others: gave feedback Mood/Affect: appropriate Triggers (if applicable): n/a Cognition: concrete Progress: Moderate Response: pt stated that his positive traits were being a good listener, funny and kind Plan: follow-up needed  Patients Problems:  Patient Active Problem List   Diagnosis Date Noted   MDD (major depressive disorder), recurrent severe, without psychosis (HCC) 05/26/2023   MDD (major depressive disorder) 03/13/2023   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

## 2023-06-05 DIAGNOSIS — F1410 Cocaine abuse, uncomplicated: Secondary | ICD-10-CM | POA: Diagnosis not present

## 2023-06-05 DIAGNOSIS — F1010 Alcohol abuse, uncomplicated: Secondary | ICD-10-CM | POA: Diagnosis not present

## 2023-06-05 DIAGNOSIS — F329 Major depressive disorder, single episode, unspecified: Secondary | ICD-10-CM | POA: Diagnosis not present

## 2023-06-05 DIAGNOSIS — R45851 Suicidal ideations: Secondary | ICD-10-CM | POA: Diagnosis not present

## 2023-06-05 MED ORDER — BENZOCAINE 10 % MT GEL
1.0000 | Freq: Two times a day (BID) | OROMUCOSAL | Status: DC | PRN
  Administered 2023-06-06: 1 via OROMUCOSAL
  Filled 2023-06-05: qty 9

## 2023-06-05 MED ORDER — LORAZEPAM 1 MG PO TABS: 1.0000 mg | ORAL_TABLET | Freq: Four times a day (QID) | ORAL | Status: DC | PRN

## 2023-06-05 MED ORDER — BENZOCAINE 10 % MT GEL
1.0000 | Freq: Once | OROMUCOSAL | Status: AC
  Administered 2023-06-05: 1 via OROMUCOSAL
  Filled 2023-06-05: qty 9

## 2023-06-05 NOTE — ED Provider Notes (Signed)
 Behavioral Health Progress Note  Date and Time: 06/05/2023 10:32 AM Name: Michael [name] MRN:  [id number]  Subjective:   The patient is a 48 year old male with a recent psychiatric hospitalization. At that hospitalization he presented with suicidal thoughts and was diagnosed with major depressive disorder. Soon after discharge, he presented to the emergency department reporting suicidal thoughts. He was admitted to the facility based crisis for further treatment.   Today the patient feels he is doing better.  He states that he had a good conversation with the nurse yesterday and is thinking about his future.  He denies experiencing suicidal thoughts.  He complains of a toothache and we discussed the addition of as needed Orajel.  He denies experiencing any symptoms of alcohol withdrawal.  Diagnosis:  Final diagnoses:  Alcohol use disorder  Other depression    Total Time spent with patient: 15 minutes  Past Psychiatric History: See H&P and above Past Medical History: See H&P and above Family History: See H&P and above Family Psychiatric  History: See H&P and above Social History: See H&P and above  Additional Social History:  none  Sleep: fair  Appetite:  fair  Current Medications:  Current Facility-Administered Medications  Medication Dose Route Frequency Provider Last Rate Last Admin   acetaminophen  (TYLENOL ) tablet 650 mg  650 mg Oral Q6H PRN Motley-Mangrum, Jadeka A, PMHNP   650 mg at 06/05/23 0936   alum & mag hydroxide-simeth (MAALOX/MYLANTA) 200-200-20 MG/5ML suspension 30 mL  30 mL Oral Q4H PRN Motley-Mangrum, Jadeka A, PMHNP       benzocaine (ORAJEL) 10 % mucosal gel 1 Application  1 Application Mouth/Throat BID PRN Marilou Showman, MD       benzocaine (ORAJEL) 10 % mucosal gel 1 Application  1 Application Mouth/Throat Once Marilou Showman, MD       haloperidol  (HALDOL ) tablet 5 mg  5 mg Oral TID PRN Motley-Mangrum, Jadeka A, PMHNP       And   diphenhydrAMINE   (BENADRYL ) capsule 50 mg  50 mg Oral TID PRN Motley-Mangrum, Jadeka A, PMHNP       haloperidol  lactate (HALDOL ) injection 5 mg  5 mg Intramuscular TID PRN Motley-Mangrum, Jadeka A, PMHNP       And   diphenhydrAMINE  (BENADRYL ) injection 50 mg  50 mg Intramuscular TID PRN Motley-Mangrum, Jadeka A, PMHNP       And   LORazepam  (ATIVAN ) injection 2 mg  2 mg Intramuscular TID PRN Motley-Mangrum, Jadeka A, PMHNP       haloperidol  lactate (HALDOL ) injection 10 mg  10 mg Intramuscular TID PRN Motley-Mangrum, Jadeka A, PMHNP       And   diphenhydrAMINE  (BENADRYL ) injection 50 mg  50 mg Intramuscular TID PRN Motley-Mangrum, Jadeka A, PMHNP       And   LORazepam  (ATIVAN ) injection 2 mg  2 mg Intramuscular TID PRN Motley-Mangrum, Jadeka A, PMHNP       gabapentin  (NEURONTIN ) capsule 300 mg  300 mg Oral TID Marilou Showman, MD   300 mg at 06/05/23 0865   LORazepam  (ATIVAN ) tablet 1 mg  1 mg Oral Q6H PRN Marilou Showman, MD       magnesium  hydroxide (MILK OF MAGNESIA) suspension 30 mL  30 mL Oral Daily PRN Motley-Mangrum, Jadeka A, PMHNP       nicotine  (NICODERM CQ  - dosed in mg/24 hours) patch 21 mg  21 mg Transdermal Daily Marilou Showman, MD   21 mg at 06/05/23 0936   QUEtiapine  (SEROQUEL ) tablet 100 mg  100 mg Oral QHS Marilou Showman, MD   100 mg at 06/04/23 2114   traZODone  (DESYREL ) tablet 50 mg  50 mg Oral QHS PRN Motley-Mangrum, Jadeka A, PMHNP   50 mg at 06/04/23 2114   traZODone  (DESYREL ) tablet 50 mg  50 mg Oral QHS PRN Marilou Showman, MD       venlafaxine  XR (EFFEXOR -XR) 24 hr capsule 150 mg  150 mg Oral Q breakfast Marilou Showman, MD   150 mg at 06/05/23 0900   Current Outpatient Medications  Medication Sig Dispense Refill   acetaminophen  (TYLENOL ) 500 MG tablet Take 500 mg by mouth every 6 (six) hours as needed for moderate pain (pain score 4-6).     desvenlafaxine  (PRISTIQ ) 100 MG 24 hr tablet Take 1 tablet (100 mg total) by mouth daily. 30 tablet 0   gabapentin  (NEURONTIN ) 300 MG capsule  Take 1 capsule (300 mg total) by mouth 3 (three) times daily. 90 capsule 0   nicotine  (NICODERM CQ  - DOSED IN MG/24 HOURS) 14 mg/24hr patch Place 1 patch (14 mg total) onto the skin daily. 28 patch 0   QUEtiapine  (SEROQUEL ) 100 MG tablet Take 1 tablet (100 mg total) by mouth at bedtime. 30 tablet 0   traZODone  (DESYREL ) 50 MG tablet Take 1 tablet (50 mg total) by mouth at bedtime as needed for sleep. 30 tablet 0    Labs  Lab Results:  Admission on 06/02/2023, Discharged on 06/03/2023  Component Date Value Ref Range Status   Sodium 06/02/2023 133 (L)  135 - 145 mmol/L Final   Potassium 06/02/2023 4.0  3.5 - 5.1 mmol/L Final   Chloride 06/02/2023 96 (L)  98 - 111 mmol/L Final   CO2 06/02/2023 25  22 - 32 mmol/L Final   Glucose, Bld 06/02/2023 111 (H)  70 - 99 mg/dL Final   Glucose reference range applies only to samples taken after fasting for at least 8 hours.   BUN 06/02/2023 21 (H)  6 - 20 mg/dL Final   Creatinine, Ser 06/02/2023 0.93  0.61 - 1.24 mg/dL Final   Calcium 21/30/8657 9.2  8.9 - 10.3 mg/dL Final   Total Protein 84/69/6295 8.7 (H)  6.5 - 8.1 g/dL Final   Albumin 28/41/3244 4.5  3.5 - 5.0 g/dL Final   AST 02/10/7251 18  15 - 41 U/L Final   ALT 06/02/2023 15  0 - 44 U/L Final   Alkaline Phosphatase 06/02/2023 82  38 - 126 U/L Final   Total Bilirubin 06/02/2023 1.0  0.0 - 1.2 mg/dL Final   GFR, Estimated 06/02/2023 >60  >60 mL/min Final   Comment: (NOTE) Calculated using the CKD-EPI Creatinine Equation (2021)    Anion gap 06/02/2023 12  5 - 15 Final   Performed at [facility], [address], [place], [place] [address]   Alcohol, Ethyl (B) 06/02/2023 <15  <15 mg/dL Final   Comment: Please note change in reference range. (NOTE) For medical purposes only. Performed at [facility], [address] [place], [place] [address]    WBC 06/02/2023 12.2 (H)  4.0 - 10.5 K/uL Final   RBC 06/02/2023 4.96  4.22 - 5.81 MIL/uL Final    Hemoglobin 06/02/2023 15.4  13.0 - 17.0 g/dL Final   HCT 59/56/3875 47.0  39.0 - 52.0 % Final   MCV 06/02/2023 94.8  80.0 - 100.0 fL Final   MCH 06/02/2023 31.0  26.0 - 34.0 pg Final   MCHC 06/02/2023 32.8  30.0 - 36.0  g/dL Final   RDW 29/56/2130 13.1  11.5 - 15.5 % Final   Platelets 06/02/2023 352  150 - 400 K/uL Final   nRBC 06/02/2023 0.0  0.0 - 0.2 % Final   Performed at [facility], [address] [place], [place] [address]   Opiates 06/02/2023 NONE DETECTED  NONE DETECTED Final   Cocaine 06/02/2023 POSITIVE (A)  NONE DETECTED Final   Benzodiazepines 06/02/2023 NONE DETECTED  NONE DETECTED Final   Amphetamines 06/02/2023 NONE DETECTED  NONE DETECTED Final   Tetrahydrocannabinol 06/02/2023 NONE DETECTED  NONE DETECTED Final   Barbiturates 06/02/2023 NONE DETECTED  NONE DETECTED Final   Comment: (NOTE) DRUG SCREEN FOR MEDICAL PURPOSES ONLY.  IF CONFIRMATION IS NEEDED FOR ANY PURPOSE, NOTIFY LAB WITHIN 5 DAYS.  LOWEST DETECTABLE LIMITS FOR URINE DRUG SCREEN Drug Class                     Cutoff (ng/mL) Amphetamine and metabolites    1000 Barbiturate and metabolites    200 Benzodiazepine                 200 Opiates and metabolites        300 Cocaine and metabolites        300 THC                            50 Performed at [facility], [address] [place], [place] [address]   Admission on 05/26/2023, Discharged on 05/31/2023  Component Date Value Ref Range Status   Folate 05/27/2023 9.9  >5.9 ng/mL Final   Performed at [facility], [address], [place], [place] [address]   RPR Ser Ql 05/27/2023 NON REACTIVE  NON REACTIVE Final   Performed at [facility], [address], [place], [place] [address]   TSH 05/27/2023 2.185  0.350 - 4.500 uIU/mL Final   Comment: Performed by a 3rd Generation assay with a functional sensitivity of <=0.01 uIU/mL. Performed at [facility], [address] [place], [place] [address]    Vitamin B-12 05/27/2023 173 (L)  180 - 914 pg/mL Final   Comment: (NOTE) This assay is not validated for testing neonatal or myeloproliferative syndrome specimens for Vitamin B12 levels. Performed at [facility], [address], [place], [place] [address]    Vit D, 25-Hydroxy 05/27/2023 35.74  30 - 100 ng/mL Final   Comment: (NOTE) Vitamin D  deficiency has been defined by the Institute of Medicine  and an Endocrine Society practice guideline as a level of serum 25-OH  vitamin D  less than 20 ng/mL (1,2). The Endocrine Society went on to  further define vitamin D  insufficiency as a level between 21 and 29  ng/mL (2).  1. IOM (Institute of Medicine). 2010. Dietary reference intakes for  calcium and D. [place]: [employer]. 2. Holick MF, Binkley Rutledge, Bischoff-Ferrari HA, et al. Evaluation,  treatment, and prevention of vitamin D  deficiency: an Endocrine  Society clinical practice guideline, JCEM. 2011 Jul; 96(7): 1911-30.  Performed at [facility], [address] [place], [place] [address]    Sodium 05/27/2023 138  135 - 145 mmol/L Final   Potassium 05/27/2023 3.8  3.5 - 5.1 mmol/L Final   Chloride 05/27/2023 103  98 - 111 mmol/L Final   CO2 05/27/2023 25  22 - 32 mmol/L Final   Glucose,  Bld 05/27/2023 109 (H)  70 - 99 mg/dL Final   Glucose reference range applies only to samples taken after fasting for at least 8 hours.   BUN 05/27/2023 13  6 - 20 mg/dL Final   Creatinine, Ser 05/27/2023 0.91  0.61 - 1.24 mg/dL Final   Calcium 60/63/0160 8.8 (L)  8.9 - 10.3 mg/dL Final   GFR, Estimated 05/27/2023 >60  >60 mL/min Final   Comment: (NOTE) Calculated using the CKD-EPI Creatinine Equation (2021)    Anion gap 05/27/2023 10  5 - 15 Final   Performed at [facility], [address], [place], [place] [address]  Admission on 05/26/2023, Discharged on 05/26/2023  Component Date Value Ref  Range Status   Sodium 05/26/2023 138  135 - 145 mmol/L Final   Potassium 05/26/2023 3.1 (L)  3.5 - 5.1 mmol/L Final   Chloride 05/26/2023 101  98 - 111 mmol/L Final   CO2 05/26/2023 25  22 - 32 mmol/L Final   Glucose, Bld 05/26/2023 98  70 - 99 mg/dL Final   Glucose reference range applies only to samples taken after fasting for at least 8 hours.   BUN 05/26/2023 11  6 - 20 mg/dL Final   Creatinine, Ser 05/26/2023 0.57 (L)  0.61 - 1.24 mg/dL Final   Calcium 35/57/3220 8.7 (L)  8.9 - 10.3 mg/dL Final   Total Protein 25/42/7062 7.6  6.5 - 8.1 g/dL Final   Albumin 37/62/8315 4.2  3.5 - 5.0 g/dL Final   AST 17/61/6073 18  15 - 41 U/L Final   ALT 05/26/2023 14  0 - 44 U/L Final   Alkaline Phosphatase 05/26/2023 55  38 - 126 U/L Final   Total Bilirubin 05/26/2023 0.9  0.0 - 1.2 mg/dL Final   GFR, Estimated 05/26/2023 >60  >60 mL/min Final   Comment: (NOTE) Calculated using the CKD-EPI Creatinine Equation (2021)    Anion gap 05/26/2023 12  5 - 15 Final   Performed at [facility], [address] [place], [place] [address]   Alcohol, Ethyl (B) 05/26/2023 <10  <10 mg/dL Final   Comment: (NOTE) For medical purposes only. Performed at [facility], [address] [place], [place] [address]    Opiates 05/26/2023 NONE DETECTED  NONE DETECTED Final   Cocaine 05/26/2023 NONE DETECTED  NONE DETECTED Final   Benzodiazepines 05/26/2023 NONE DETECTED  NONE DETECTED Final   Amphetamines 05/26/2023 NONE DETECTED  NONE DETECTED Final   Tetrahydrocannabinol 05/26/2023 NONE DETECTED  NONE DETECTED Final   Barbiturates 05/26/2023 NONE DETECTED  NONE DETECTED Final   Comment: (NOTE) DRUG SCREEN FOR MEDICAL PURPOSES ONLY.  IF CONFIRMATION IS NEEDED FOR ANY PURPOSE, NOTIFY LAB WITHIN 5 DAYS.  LOWEST DETECTABLE LIMITS FOR URINE DRUG SCREEN Drug Class                     Cutoff (ng/mL) Amphetamine and metabolites    1000 Barbiturate and metabolites     200 Benzodiazepine                 200 Opiates and metabolites        300 Cocaine and metabolites        300 THC                            50 Performed at [facility], [address], [place], [place] [address]  WBC 05/26/2023 6.9  4.0 - 10.5 K/uL Final   RBC 05/26/2023 4.46  4.22 - 5.81 MIL/uL Final   Hemoglobin 05/26/2023 14.0  13.0 - 17.0 g/dL Final   HCT 13/09/6576 42.3  39.0 - 52.0 % Final   MCV 05/26/2023 94.8  80.0 - 100.0 fL Final   MCH 05/26/2023 31.4  26.0 - 34.0 pg Final   MCHC 05/26/2023 33.1  30.0 - 36.0 g/dL Final   RDW 46/96/2952 13.2  11.5 - 15.5 % Final   Platelets 05/26/2023 276  150 - 400 K/uL Final   nRBC 05/26/2023 0.0  0.0 - 0.2 % Final   Neutrophils Relative % 05/26/2023 58  % Final   Neutro Abs 05/26/2023 4.0  1.7 - 7.7 K/uL Final   Lymphocytes Relative 05/26/2023 31  % Final   Lymphs Abs 05/26/2023 2.1  0.7 - 4.0 K/uL Final   Monocytes Relative 05/26/2023 8  % Final   Monocytes Absolute 05/26/2023 0.5  0.1 - 1.0 K/uL Final   Eosinophils Relative 05/26/2023 2  % Final   Eosinophils Absolute 05/26/2023 0.1  0.0 - 0.5 K/uL Final   Basophils Relative 05/26/2023 1  % Final   Basophils Absolute 05/26/2023 0.1  0.0 - 0.1 K/uL Final   Immature Granulocytes 05/26/2023 0  % Final   Abs Immature Granulocytes 05/26/2023 0.02  0.00 - 0.07 K/uL Final   Performed at [facility], [address], [place], [place] [address]  Admission on 03/28/2023, Discharged on 04/04/2023  Component Date Value Ref Range Status   TSH 03/30/2023 2.019  0.350 - 4.500 uIU/mL Final   Comment: Performed by a 3rd Generation assay with a functional sensitivity of <=0.01 uIU/mL. Performed at [facility], [address], [place], [place] [address]    Hgb A1c MFr Bld 03/30/2023 5.2  4.8 - 5.6 % Final   Comment: (NOTE) Pre diabetes:          5.7%-6.4%  Diabetes:              >6.4%  Glycemic control for   <7.0% adults with  diabetes    Mean Plasma Glucose 03/30/2023 102.54  mg/dL Final   Performed at [facility], [address], [place], [place] [address]   Cholesterol 03/30/2023 254 (H)  0 - 200 mg/dL Final   Triglycerides 66/44/0347 144  <150 mg/dL Final   HDL 42/59/5638 65  >40 mg/dL Final   Total CHOL/HDL Ratio 03/30/2023 3.9  RATIO Final   VLDL 03/30/2023 29  0 - 40 mg/dL Final   LDL Cholesterol 03/30/2023 160 (H)  0 - 99 mg/dL Final   Comment:        Total Cholesterol/HDL:CHD Risk Coronary Heart Disease Risk Table                     Men   Women  1/2 Average Risk   3.4   3.3  Average Risk       5.0   4.4  2 X Average Risk   9.6   7.1  3 X Average Risk  23.4   11.0        Use the calculated Patient Ratio above and the CHD Risk Table to determine the patient's CHD Risk.        ATP III CLASSIFICATION (LDL):  <100     mg/dL   Optimal  756-433  mg/dL   Near or Above  Optimal  130-159  mg/dL   Borderline  161-096  mg/dL   High  >045     mg/dL   Very High Performed at [facility], [address] [place], [place] [address]   Admission on 03/28/2023, Discharged on 03/28/2023  Component Date Value Ref Range Status   Sodium 03/28/2023 139  135 - 145 mmol/L Final   Potassium 03/28/2023 3.4 (L)  3.5 - 5.1 mmol/L Final   Chloride 03/28/2023 103  98 - 111 mmol/L Final   CO2 03/28/2023 24  22 - 32 mmol/L Final   Glucose, Bld 03/28/2023 106 (H)  70 - 99 mg/dL Final   Glucose reference range applies only to samples taken after fasting for at least 8 hours.   BUN 03/28/2023 6  6 - 20 mg/dL Final   Creatinine, Ser 03/28/2023 0.75  0.61 - 1.24 mg/dL Final   Calcium 19/14/7829 8.8 (L)  8.9 - 10.3 mg/dL Final   Total Protein 56/21/3086 7.4  6.5 - 8.1 g/dL Final   Albumin 57/84/6962 3.8  3.5 - 5.0 g/dL Final   AST 95/28/4132 18  15 - 41 U/L Final   ALT 03/28/2023 15  0 - 44 U/L Final   Alkaline Phosphatase 03/28/2023 55  38 - 126 U/L Final   Total Bilirubin  03/28/2023 0.4  0.0 - 1.2 mg/dL Final   GFR, Estimated 03/28/2023 >60  >60 mL/min Final   Comment: (NOTE) Calculated using the CKD-EPI Creatinine Equation (2021)    Anion gap 03/28/2023 12  5 - 15 Final   Performed at [facility], [address] [place], [place] [address]   Alcohol, Ethyl (B) 03/28/2023 14 (H)  <10 mg/dL Final   Comment: (NOTE) Lowest detectable limit for serum alcohol is 10 mg/dL.  For medical purposes only. Performed at [facility], [address] [place], [place] [address]    Salicylate Lvl 03/28/2023 <7.0 (L)  7.0 - 30.0 mg/dL Final   Performed at [facility], [address] [place], [place] [address]   Acetaminophen  (Tylenol ), Serum 03/28/2023 <10 (L)  10 - 30 ug/mL Final   Comment: (NOTE) Therapeutic concentrations vary significantly. A range of 10-30 ug/mL  may be an effective concentration for many patients. However, some  are best treated at concentrations outside of this range. Acetaminophen  concentrations >150 ug/mL at 4 hours after ingestion  and >50 ug/mL at 12 hours after ingestion are often associated with  toxic reactions.  Performed at [facility], [address], [place], [place] [address]    WBC 03/28/2023 6.1  4.0 - 10.5 K/uL Final   RBC 03/28/2023 4.55  4.22 - 5.81 MIL/uL Final   Hemoglobin 03/28/2023 14.2  13.0 - 17.0 g/dL Final   HCT 59/56/3875 42.1  39.0 - 52.0 % Final   MCV 03/28/2023 92.5  80.0 - 100.0 fL Final   MCH 03/28/2023 31.2  26.0 - 34.0 pg Final   MCHC 03/28/2023 33.7  30.0 - 36.0 g/dL Final   RDW 64/33/2951 12.8  11.5 - 15.5 % Final   Platelets 03/28/2023 383  150 - 400 K/uL Final   nRBC 03/28/2023 0.0  0.0 - 0.2 % Final   Performed at [facility], [address] [place], [place] [address]   Opiates 03/28/2023 NONE DETECTED  NONE DETECTED Final   Cocaine 03/28/2023 NONE DETECTED  NONE DETECTED Final   Benzodiazepines  03/28/2023 NONE DETECTED  NONE DETECTED Final  Amphetamines 03/28/2023 NONE DETECTED  NONE DETECTED Final   Tetrahydrocannabinol 03/28/2023 POSITIVE (A)  NONE DETECTED Final   Barbiturates 03/28/2023 NONE DETECTED  NONE DETECTED Final   Comment: (NOTE) DRUG SCREEN FOR MEDICAL PURPOSES ONLY.  IF CONFIRMATION IS NEEDED FOR ANY PURPOSE, NOTIFY LAB WITHIN 5 DAYS.  LOWEST DETECTABLE LIMITS FOR URINE DRUG SCREEN Drug Class                     Cutoff (ng/mL) Amphetamine and metabolites    1000 Barbiturate and metabolites    200 Benzodiazepine                 200 Opiates and metabolites        300 Cocaine and metabolites        300 THC                            50 Performed at [facility], [address] [place], [place] [address]   Admission on 03/13/2023, Discharged on 03/13/2023  Component Date Value Ref Range Status   Sodium 03/13/2023 135  135 - 145 mmol/L Final   Potassium 03/13/2023 3.9  3.5 - 5.1 mmol/L Final   Chloride 03/13/2023 99  98 - 111 mmol/L Final   CO2 03/13/2023 28  22 - 32 mmol/L Final   Glucose, Bld 03/13/2023 120 (H)  70 - 99 mg/dL Final   Glucose reference range applies only to samples taken after fasting for at least 8 hours.   BUN 03/13/2023 5 (L)  6 - 20 mg/dL Final   Creatinine, Ser 03/13/2023 0.70  0.61 - 1.24 mg/dL Final   Calcium 78/46/9629 8.7 (L)  8.9 - 10.3 mg/dL Final   Total Protein 52/84/1324 7.1  6.5 - 8.1 g/dL Final   Albumin 40/11/2723 4.3  3.5 - 5.0 g/dL Final   AST 36/64/4034 22  15 - 41 U/L Final   ALT 03/13/2023 11  0 - 44 U/L Final   Alkaline Phosphatase 03/13/2023 55  38 - 126 U/L Final   Total Bilirubin 03/13/2023 0.8  0.0 - 1.2 mg/dL Final   GFR, Estimated 03/13/2023 >60  >60 mL/min Final   Comment: (NOTE) Calculated using the CKD-EPI Creatinine Equation (2021)    Anion gap 03/13/2023 8  5 - 15 Final   Performed at [facility], [address] [place], [place] [address]   Alcohol, Ethyl (B)  03/13/2023 <10  <10 mg/dL Final   Comment: (NOTE) Lowest detectable limit for serum alcohol is 10 mg/dL.  For medical purposes only. Performed at [facility], [address], [place], [place] [address]    WBC 03/13/2023 10.9 (H)  4.0 - 10.5 K/uL Final   RBC 03/13/2023 4.81  4.22 - 5.81 MIL/uL Final   Hemoglobin 03/13/2023 14.8  13.0 - 17.0 g/dL Final   HCT 56/43/3295 43.5  39.0 - 52.0 % Final   MCV 03/13/2023 90.4  80.0 - 100.0 fL Final   MCH 03/13/2023 30.8  26.0 - 34.0 pg Final   MCHC 03/13/2023 34.0  30.0 - 36.0 g/dL Final   RDW 18/84/1660 12.2  11.5 - 15.5 % Final   Platelets 03/13/2023 288  150 - 400 K/uL Final   nRBC 03/13/2023 0.0  0.0 - 0.2 % Final   Performed at [facility], [address] [place], [place] [address]   Opiates 03/13/2023 NONE DETECTED  NONE DETECTED  Final   Cocaine 03/13/2023 NONE DETECTED  NONE DETECTED Final   Benzodiazepines 03/13/2023 POSITIVE (A)  NONE DETECTED Final   Amphetamines 03/13/2023 NONE DETECTED  NONE DETECTED Final   Tetrahydrocannabinol 03/13/2023 POSITIVE (A)  NONE DETECTED Final   Barbiturates 03/13/2023 NONE DETECTED  NONE DETECTED Final   Comment: (NOTE) DRUG SCREEN FOR MEDICAL PURPOSES ONLY.  IF CONFIRMATION IS NEEDED FOR ANY PURPOSE, NOTIFY LAB WITHIN 5 DAYS.  LOWEST DETECTABLE LIMITS FOR URINE DRUG SCREEN Drug Class                     Cutoff (ng/mL) Amphetamine and metabolites    1000 Barbiturate and metabolites    200 Benzodiazepine                 200 Opiates and metabolites        300 Cocaine and metabolites        300 THC                            50 Performed at [facility], [address] [place], [place] [address]     Blood Alcohol level:  Lab Results  Component Value Date   [facility] <15 06/02/2023   ETH <10 05/26/2023    Metabolic Disorder Labs: Lab Results  Component Value Date   HGBA1C 5.2 03/30/2023   MPG 102.54 03/30/2023   No results found  for: "PROLACTIN" Lab Results  Component Value Date   CHOL 254 (H) 03/30/2023   TRIG 144 03/30/2023   HDL 65 03/30/2023   CHOLHDL 3.9 03/30/2023   VLDL 29 03/30/2023   LDLCALC 160 (H) 03/30/2023    Therapeutic Lab Levels: No results found for: "LITHIUM" No results found for: "VALPROATE" No results found for: "CBMZ"  Physical Findings   AUDIT    Flowsheet Row [facility]) [facility]-Admission (Discharged) [facility]  Alcohol Use Disorder Identification Test Final Score (AUDIT) 0 0      PHQ2-9    Flowsheet Row ED from 06/03/2023 in [facility] ED from 05/26/2023 in [facility] at [facility] ED from 03/13/2023 in [facility]  PHQ-2 Total Score 6 4 4   PHQ-9 Total Score 25 15 13       Flowsheet Row ED from 06/03/2023 in [facility] ED from 06/02/2023 in [facility] Department [facility] Admission (Discharged) [facility]  C-SSRS RISK CATEGORY High Risk High Risk Low Risk        Psychiatric Specialty Exam: Physical Exam Constitutional:      Appearance: the patient is not toxic-appearing.  Pulmonary:     Effort: Pulmonary effort is normal.  Neurological:     General: No focal deficit present.     Mental Status: the patient is alert and oriented to person, place, and time.   Review of Systems  Respiratory:  Negative for shortness of breath.   Cardiovascular:  Negative for chest pain.  Gastrointestinal:  Negative for abdominal pain, constipation, diarrhea, nausea and vomiting.  Neurological:  Negative for headaches.      BP 125/75 (BP Location: Right Arm)   Pulse 73   Temp 98.3 F (36.8 C) (Oral)   Resp 16   SpO2 97%  General Appearance: Fairly Groomed   Eye Contact:  Good  Speech:  Clear and Coherent  Volume:  Normal  Mood:  Euthymic  Affect:  Congruent  Thought Process:  Coherent  Orientation:  Full (Time, Place, and Person)  Thought Content: Logical   Suicidal Thoughts:  No  Homicidal Thoughts:  No  Memory:  Immediate;   Good  Judgement:  fair  Insight:  fair  Psychomotor Activity:  Normal  Concentration:  Concentration: Good  Recall:  Good  Fund of Knowledge: Good  Language: Good  Akathisia:  No  Handed:  not assessed  AIMS (if indicated): not done  Assets:  Communication Skills Desire for Improvement [facility]  ADL's:  Intact  Cognition: WNL  Sleep:  Fair    Treatment Plan Summary: Daily contact with patient to assess and evaluate symptoms and progress in treatment and Medication management.  Status: Voluntary   Alcohol use disorder, cocaine abuse - No withdrawal currently - No previous substance use treatment history - Restarted gabapentin  for anxiety and alcohol cravings - Residential rehab placement   Depression - Unable to prescribe Pristiq , started Effexor  150 mg - Started Seroquel  100 mg  Marilou Showman, MD 06/05/2023 10:32 AM

## 2023-06-05 NOTE — ED Notes (Signed)
 Pt sitting in dayroom watching television and interacting with peers. No acute distress noted. No concerns voiced. Informed pt to notify staff with any needs or assistance. Pt verbalized understanding and agreement. Will continue to monitor for safety.

## 2023-06-05 NOTE — Group Note (Signed)
 Group Topic: Change and Accountability  Group Date: 06/05/2023 Start Time: 1500 End Time: 1600 Facilitators: Elfredia Grippe, NT  Department: [facility]  Number of Participants: 8  Group Focus: goals/reality orientation Treatment Modality:  Psychoeducation Interventions utilized were patient education and reality testing Purpose: increase insight  Name: Michael [name] Date of Birth: 08-18-1975  MR: [id number]    Level of Participation: active Quality of Participation: attentive Interactions with others: gave feedback Mood/Affect: appropriate Triggers (if applicable): n/a Cognition: concrete Progress: Moderate Response: pts goal in the next year is to rebuild his relationship with his daughter Plan: follow-up needed  Patients Problems:  Patient Active Problem List   Diagnosis Date Noted   MDD (major depressive disorder), recurrent severe, without psychosis (HCC) 05/26/2023   MDD (major depressive disorder) 03/13/2023   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

## 2023-06-05 NOTE — ED Notes (Signed)
 Pt is in the bedroom sleeping comfortably on the bed. NAD. environment secured.  Will keep monitoring for safety

## 2023-06-05 NOTE — ED Notes (Signed)
 Pt sitting in dayroom watching television and interacting with another peer. No acute distress noted. No concerns voiced. Informed pt to notify staff with any needs or assistance. Pt verbalized understanding and agreement. Will continue to monitor for safety.

## 2023-06-05 NOTE — ED Notes (Signed)
 Patient is cooperative and compliant with staff directions; he is social on approach and initiates interactions with staff and peers. Patient denies any homicidal or suicidal ideations as well as any hallucinations and describes his mood as "alright." He states his appetite is normal and his last bowel movement was today. Will continue to provide support, redirection, and further interventions as needed.

## 2023-06-05 NOTE — ED Notes (Signed)
 Patient appeared to be sleeping, respirations present, no distress.

## 2023-06-05 NOTE — Group Note (Signed)
 Group Topic: Healthy Self Image and Positive Change  Group Date: 06/05/2023 Start Time: 5621 End Time: 2000 Facilitators: Wendall Halls B  Department: [facility]  Number of Participants: 7  Group Focus: abuse issues, activities of daily living skills, affirmation, anxiety, check in, chemical dependency issues, clarity of thought, communication, coping skills, daily focus, depression, family, feeling awareness/expression, forgiveness, goals/reality orientation, healthy friendships, music therapy, problem solving, relapse prevention, relaxation, self-awareness, self-esteem, and substance abuse education Treatment Modality:  Leisure Development Interventions utilized were leisure development Purpose: enhance coping skills, express feelings, and relapse prevention strategies  Name: Michael [name] Date of Birth: 1975-04-29  MR: [id number]    Level of Participation: active Quality of Participation: attentive, cooperative, and initiates communication Interactions with others: gave feedback Mood/Affect: appropriate Triggers (if applicable): NA Cognition: coherent/clear Progress: Gaining insight Response: NA Plan: patient will be encouraged to keep going to groups.   Patients Problems:  Patient Active Problem List   Diagnosis Date Noted   MDD (major depressive disorder), recurrent severe, without psychosis (HCC) 05/26/2023   MDD (major depressive disorder) 03/13/2023   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

## 2023-06-05 NOTE — Group Note (Signed)
 Group Topic: Understanding Self  Group Date: 06/05/2023 Start Time: 1200 End Time: 1230 Facilitators: Denzil Flatten, RN  Department: [facility]  Number of Participants: 9  Group Focus: coping skills Treatment Modality:  Psychoeducation Interventions utilized were problem solving Purpose: trigger / craving management  Name: Michael [name] Date of Birth: January 10, 1976  MR: [id number]    Level of Participation: active Quality of Participation: cooperative Interactions with others: gave feedback Mood/Affect: appropriate Triggers (if applicable): "I start to isolate myself and become really sad" Cognition: coherent/clear Progress: Significant Response: "I like journaling my feelings and find someone to talk too" Plan: patient will be encouraged to utilize identified coping skills for sx management  Patients Problems:  Patient Active Problem List   Diagnosis Date Noted   MDD (major depressive disorder), recurrent severe, without psychosis (HCC) 05/26/2023   MDD (major depressive disorder) 03/13/2023   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

## 2023-06-05 NOTE — ED Notes (Signed)
 Patient A&Ox4. Denies intent to harm self/others when asked. Denies A/VH. Patient denies any physical complaints when asked. No acute distress noted. Support and encouragement provided. Routine safety checks conducted according to facility protocol. Encouraged patient to notify staff if thoughts of harm toward self or others arise. Patient verbalize understanding and agreement. Will continue to monitor for safety.

## 2023-06-05 NOTE — ED Provider Notes (Signed)
 Facility Based Crisis Admission H&P  Date: 06/05/23 Patient Name: Michael [name] MRN: [id number] Chief Complaint: substance use  Diagnoses:  Final diagnoses:  Alcohol use disorder  Other depression    HPI:  The patient is a 48 year old male with a recent psychiatric hospitalization.  At that hospitalization he presented with suicidal thoughts and was diagnosed with major depressive disorder.  Soon after discharge, he presented to the emergency department reporting suicidal thoughts.  He was admitted to the facility based crisis for further treatment.  The patient feels he was doing fairly well in life until the COVID-19 pandemic.  He feels the pandemic led to significant problems in his life.  He talks about the breakdown of his family relationships.  He reports some connection to his 66 year old daughter, and he says he wants to be sober so that he can be involved in her life.  The patient reports frequent use of alcohol and cocaine.  He denies experiencing any withdrawal presently.  He denies history of severe withdrawal symptoms.  The patient reports significant depressive symptoms.  He reports continued passive suicidal thoughts.  He contracts for safety on the unit.  PHQ 2-9:  Flowsheet Row ED from 06/03/2023 in [facility] ED from 05/26/2023 in [facility] Emergency Department at [facility] ED from 03/13/2023 in [facility]  Thoughts that you would be better off dead, or of hurting yourself in some way Several days Nearly every day Several days  PHQ-9 Total Score 25 15 13        Flowsheet Row ED from 06/03/2023 in [facility] ED from 06/02/2023 in [facility] Emergency Department at [facility] Admission (Discharged) [facility]  C-SSRS RISK CATEGORY High Risk High Risk Low Risk         Total Time spent with patient: 20  min Psychiatric Specialty Exam: Physical Exam Constitutional:      Appearance: the patient is not toxic-appearing.  Pulmonary:     Effort: Pulmonary effort is normal.  Neurological:     General: No focal deficit present.     Mental Status: the patient is alert and oriented to person, place, and time.   Review of Systems  Respiratory:  Negative for shortness of breath.   Cardiovascular:  Negative for chest pain.  Gastrointestinal:  Negative for abdominal pain, constipation, diarrhea, nausea and vomiting.  Neurological:  Negative for headaches.      BP 125/75 (BP Location: Right Arm)   Pulse 73   Temp 98.3 F (36.8 C) (Oral)   Resp 16   SpO2 97%   General Appearance: Fairly Groomed  Eye Contact:  Good  Speech:  Clear and Coherent  Volume:  Normal  Mood:  Euthymic  Affect:  Congruent  Thought Process:  Coherent  Orientation:  Full (Time, Place, and Person)  Thought Content: Logical   Suicidal Thoughts:  No  Homicidal Thoughts:  No  Memory:  Immediate;   Good  Judgement:  fair  Insight:  fair  Psychomotor Activity:  Normal  Concentration:  Concentration: Good  Recall:  Good  Fund of Knowledge: Good  Language: Good  Akathisia:  No  Handed:  not assessed  AIMS (if indicated): not done  Assets:  Communication Skills Desire for Improvement Leisure time Physical Health  ADL's:  Intact  Cognition: WNL  Sleep:  Fair        Blood pressure 125/75, pulse 73,  temperature 98.3 F (36.8 C), temperature source Oral, resp. rate 16, SpO2 97%. There is no height or weight on file to calculate BMI.  Past Psychiatric History: as above   Is the patient at risk to self? no Has the patient been a risk to self in the past 6 months? no Has the patient been a risk to self within the distant past? no Is the patient a risk to others? no Has the patient been a risk to others in the past 6 months? no Has the patient been a risk to others within the distant past? no  Past Medical  History: as above Family History: as above Social History: no additional history, as above  Last Labs:  Admission on 06/02/2023, Discharged on 06/03/2023  Component Date Value Ref Range Status   Sodium 06/02/2023 133 (L)  135 - 145 mmol/L Final   Potassium 06/02/2023 4.0  3.5 - 5.1 mmol/L Final   Chloride 06/02/2023 96 (L)  98 - 111 mmol/L Final   CO2 06/02/2023 25  22 - 32 mmol/L Final   Glucose, Bld 06/02/2023 111 (H)  70 - 99 mg/dL Final   Glucose reference range applies only to samples taken after fasting for at least 8 hours.   BUN 06/02/2023 21 (H)  6 - 20 mg/dL Final   Creatinine, Ser 06/02/2023 0.93  0.61 - 1.24 mg/dL Final   Calcium 86/57/8469 9.2  8.9 - 10.3 mg/dL Final   Total Protein 62/95/2841 8.7 (H)  6.5 - 8.1 g/dL Final   Albumin 32/44/0102 4.5  3.5 - 5.0 g/dL Final   AST 72/53/6644 18  15 - 41 U/L Final   ALT 06/02/2023 15  0 - 44 U/L Final   Alkaline Phosphatase 06/02/2023 82  38 - 126 U/L Final   Total Bilirubin 06/02/2023 1.0  0.0 - 1.2 mg/dL Final   GFR, Estimated 06/02/2023 >60  >60 mL/min Final   Comment: (NOTE) Calculated using the CKD-EPI Creatinine Equation (2021)    Anion gap 06/02/2023 12  5 - 15 Final   Performed at [facility], [address], [place], [place] [address]   Alcohol, Ethyl (B) 06/02/2023 <15  <15 mg/dL Final   Comment: Please note change in reference range. (NOTE) For medical purposes only. Performed at [facility], [address], [place], [place] [address]    WBC 06/02/2023 12.2 (H)  4.0 - 10.5 K/uL Final   RBC 06/02/2023 4.96  4.22 - 5.81 MIL/uL Final   Hemoglobin 06/02/2023 15.4  13.0 - 17.0 g/dL Final   HCT 38/75/6433 47.0  39.0 - 52.0 % Final   MCV 06/02/2023 94.8  80.0 - 100.0 fL Final   MCH 06/02/2023 31.0  26.0 - 34.0 pg Final   MCHC 06/02/2023 32.8  30.0 - 36.0 g/dL Final   RDW 29/51/8841 13.1  11.5 - 15.5 % Final   Platelets 06/02/2023 352  150 - 400 K/uL Final   nRBC 06/02/2023  0.0  0.0 - 0.2 % Final   Performed at [facility], [address], [place], [place] [address]   Opiates 06/02/2023 NONE DETECTED  NONE DETECTED Final   Cocaine 06/02/2023 POSITIVE (A)  NONE DETECTED Final   Benzodiazepines 06/02/2023 NONE DETECTED  NONE DETECTED Final   Amphetamines 06/02/2023 NONE DETECTED  NONE DETECTED Final   Tetrahydrocannabinol 06/02/2023 NONE DETECTED  NONE DETECTED Final   Barbiturates 06/02/2023 NONE DETECTED  NONE DETECTED Final   Comment: (NOTE) DRUG SCREEN FOR MEDICAL  PURPOSES ONLY.  IF CONFIRMATION IS NEEDED FOR ANY PURPOSE, NOTIFY LAB WITHIN 5 DAYS.  LOWEST DETECTABLE LIMITS FOR URINE DRUG SCREEN Drug Class                     Cutoff (ng/mL) Amphetamine and metabolites    1000 Barbiturate and metabolites    200 Benzodiazepine                 200 Opiates and metabolites        300 Cocaine and metabolites        300 THC                            50 Performed at [facility], [address], [place], [place] [address]   Admission on 05/26/2023, Discharged on 05/31/2023  Component Date Value Ref Range Status   Folate 05/27/2023 9.9  >5.9 ng/mL Final   Performed at [facility], [address], [place], [place] [address]   RPR Ser Ql 05/27/2023 NON REACTIVE  NON REACTIVE Final   Performed at [facility], [address], [place], [place] [address]   TSH 05/27/2023 2.185  0.350 - 4.500 uIU/mL Final   Comment: Performed by a 3rd Generation assay with a functional sensitivity of <=0.01 uIU/mL. Performed at [facility], [address] [place], [place] [address]    Vitamin B-12 05/27/2023 173 (L)  180 - 914 pg/mL Final   Comment: (NOTE) This assay is not validated for testing neonatal or myeloproliferative syndrome specimens for Vitamin B12 levels. Performed at [facility], [address], [place], [place] [address]    Vit D, 25-Hydroxy 05/27/2023  35.74  30 - 100 ng/mL Final   Comment: (NOTE) Vitamin D  deficiency has been defined by the Institute of Medicine  and an Endocrine Society practice guideline as a level of serum 25-OH  vitamin D  less than 20 ng/mL (1,2). The Endocrine Society went on to  further define vitamin D  insufficiency as a level between 21 and 29  ng/mL (2).  1. IOM (Institute of Medicine). 2010. Dietary reference intakes for  calcium and D. [place]: [employer]. 2. Holick MF, Binkley [place], Bischoff-Ferrari HA, et al. Evaluation,  treatment, and prevention of vitamin D  deficiency: an Endocrine  Society clinical practice guideline, JCEM. 2011 Jul; 96(7): 1911-30.  Performed at [facility], [address] [place], [place] [address]    Sodium 05/27/2023 138  135 - 145 mmol/L Final   Potassium 05/27/2023 3.8  3.5 - 5.1 mmol/L Final   Chloride 05/27/2023 103  98 - 111 mmol/L Final   CO2 05/27/2023 25  22 - 32 mmol/L Final   Glucose, Bld 05/27/2023 109 (H)  70 - 99 mg/dL Final   Glucose reference range applies only to samples taken after fasting for at least 8 hours.   BUN 05/27/2023 13  6 - 20 mg/dL Final   Creatinine, Ser 05/27/2023 0.91  0.61 - 1.24 mg/dL Final   Calcium 28/41/3244 8.8 (L)  8.9 - 10.3 mg/dL Final   GFR, Estimated 05/27/2023 >60  >60 mL/min Final   Comment: (NOTE) Calculated using the CKD-EPI Creatinine Equation (2021)    Anion gap 05/27/2023 10  5 - 15 Final   Performed at [facility], [address] [place], [place] [address]  Admission on 05/26/2023, Discharged on 05/26/2023  Component Date Value Ref Range Status   Sodium 05/26/2023 138  135 - 145 mmol/L Final   Potassium 05/26/2023 3.1 (L)  3.5 - 5.1 mmol/L Final   Chloride 05/26/2023 101  98 - 111 mmol/L Final   CO2 05/26/2023 25  22 - 32 mmol/L Final   Glucose, Bld 05/26/2023 98  70 - 99 mg/dL Final   Glucose reference range applies only to samples taken after fasting for at least 8  hours.   BUN 05/26/2023 11  6 - 20 mg/dL Final   Creatinine, Ser 05/26/2023 0.57 (L)  0.61 - 1.24 mg/dL Final   Calcium 16/11/9602 8.7 (L)  8.9 - 10.3 mg/dL Final   Total Protein 54/10/8117 7.6  6.5 - 8.1 g/dL Final   Albumin 14/78/2956 4.2  3.5 - 5.0 g/dL Final   AST 21/30/8657 18  15 - 41 U/L Final   ALT 05/26/2023 14  0 - 44 U/L Final   Alkaline Phosphatase 05/26/2023 55  38 - 126 U/L Final   Total Bilirubin 05/26/2023 0.9  0.0 - 1.2 mg/dL Final   GFR, Estimated 05/26/2023 >60  >60 mL/min Final   Comment: (NOTE) Calculated using the CKD-EPI Creatinine Equation (2021)    Anion gap 05/26/2023 12  5 - 15 Final   Performed at [facility], [address] [place], [place] [address]   Alcohol, Ethyl (B) 05/26/2023 <10  <10 mg/dL Final   Comment: (NOTE) For medical purposes only. Performed at [facility], [address] [place], [place] [address]    Opiates 05/26/2023 NONE DETECTED  NONE DETECTED Final   Cocaine 05/26/2023 NONE DETECTED  NONE DETECTED Final   Benzodiazepines 05/26/2023 NONE DETECTED  NONE DETECTED Final   Amphetamines 05/26/2023 NONE DETECTED  NONE DETECTED Final   Tetrahydrocannabinol 05/26/2023 NONE DETECTED  NONE DETECTED Final   Barbiturates 05/26/2023 NONE DETECTED  NONE DETECTED Final   Comment: (NOTE) DRUG SCREEN FOR MEDICAL PURPOSES ONLY.  IF CONFIRMATION IS NEEDED FOR ANY PURPOSE, NOTIFY LAB WITHIN 5 DAYS.  LOWEST DETECTABLE LIMITS FOR URINE DRUG SCREEN Drug Class                     Cutoff (ng/mL) Amphetamine and metabolites    1000 Barbiturate and metabolites    200 Benzodiazepine                 200 Opiates and metabolites        300 Cocaine and metabolites        300 THC                            50 Performed at [facility], [address] [place], [place] [address]    WBC 05/26/2023 6.9  4.0 - 10.5 K/uL Final   RBC 05/26/2023 4.46  4.22 - 5.81 MIL/uL Final   Hemoglobin 05/26/2023  14.0  13.0 - 17.0 g/dL Final   HCT 40/11/2723 42.3  39.0 - 52.0 % Final   MCV 05/26/2023 94.8  80.0 - 100.0 fL Final   MCH 05/26/2023 31.4  26.0 - 34.0 pg Final   MCHC 05/26/2023 33.1  30.0 - 36.0 g/dL Final   RDW 36/64/4034 13.2  11.5 - 15.5 % Final   Platelets 05/26/2023 276  150 - 400 K/uL Final   nRBC 05/26/2023 0.0  0.0 - 0.2 % Final   Neutrophils Relative % 05/26/2023 58  % Final  Neutro Abs 05/26/2023 4.0  1.7 - 7.7 K/uL Final   Lymphocytes Relative 05/26/2023 31  % Final   Lymphs Abs 05/26/2023 2.1  0.7 - 4.0 K/uL Final   Monocytes Relative 05/26/2023 8  % Final   Monocytes Absolute 05/26/2023 0.5  0.1 - 1.0 K/uL Final   Eosinophils Relative 05/26/2023 2  % Final   Eosinophils Absolute 05/26/2023 0.1  0.0 - 0.5 K/uL Final   Basophils Relative 05/26/2023 1  % Final   Basophils Absolute 05/26/2023 0.1  0.0 - 0.1 K/uL Final   Immature Granulocytes 05/26/2023 0  % Final   Abs Immature Granulocytes 05/26/2023 0.02  0.00 - 0.07 K/uL Final   Performed at [facility], [address], [place], [place] [address]  Admission on 03/28/2023, Discharged on 04/04/2023  Component Date Value Ref Range Status   TSH 03/30/2023 2.019  0.350 - 4.500 uIU/mL Final   Comment: Performed by a 3rd Generation assay with a functional sensitivity of <=0.01 uIU/mL. Performed at [facility], [address], [place], [place] [address]    Hgb A1c MFr Bld 03/30/2023 5.2  4.8 - 5.6 % Final   Comment: (NOTE) Pre diabetes:          5.7%-6.4%  Diabetes:              >6.4%  Glycemic control for   <7.0% adults with diabetes    Mean Plasma Glucose 03/30/2023 102.54  mg/dL Final   Performed at [facility], [address], [place], [place] [address]   Cholesterol 03/30/2023 254 (H)  0 - 200 mg/dL Final   Triglycerides 46/96/2952 144  <150 mg/dL Final   HDL 84/13/2440 65  >40 mg/dL Final   Total CHOL/HDL Ratio 03/30/2023 3.9  RATIO Final   VLDL 03/30/2023 29  0 -  40 mg/dL Final   LDL Cholesterol 03/30/2023 160 (H)  0 - 99 mg/dL Final   Comment:        Total Cholesterol/HDL:CHD Risk Coronary Heart Disease Risk Table                     Men   Women  1/2 Average Risk   3.4   3.3  Average Risk       5.0   4.4  2 X Average Risk   9.6   7.1  3 X Average Risk  23.4   11.0        Use the calculated Patient Ratio above and the CHD Risk Table to determine the patient's CHD Risk.        ATP III CLASSIFICATION (LDL):  <100     mg/dL   Optimal  102-725  mg/dL   Near or Above                    Optimal  130-159  mg/dL   Borderline  366-440  mg/dL   High  >347     mg/dL   Very High Performed at [facility], [address], [place], [place] [address]   Admission on 03/28/2023, Discharged on 03/28/2023  Component Date Value Ref Range Status   Sodium 03/28/2023 139  135 - 145 mmol/L Final   Potassium 03/28/2023 3.4 (L)  3.5 - 5.1 mmol/L Final   Chloride 03/28/2023 103  98 - 111 mmol/L Final   CO2 03/28/2023 24  22 - 32 mmol/L Final   Glucose, Bld 03/28/2023 106 (H)  70 -  99 mg/dL Final   Glucose reference range applies only to samples taken after fasting for at least 8 hours.   BUN 03/28/2023 6  6 - 20 mg/dL Final   Creatinine, Ser 03/28/2023 0.75  0.61 - 1.24 mg/dL Final   Calcium 14/78/2956 8.8 (L)  8.9 - 10.3 mg/dL Final   Total Protein 21/30/8657 7.4  6.5 - 8.1 g/dL Final   Albumin 84/69/6295 3.8  3.5 - 5.0 g/dL Final   AST 28/41/3244 18  15 - 41 U/L Final   ALT 03/28/2023 15  0 - 44 U/L Final   Alkaline Phosphatase 03/28/2023 55  38 - 126 U/L Final   Total Bilirubin 03/28/2023 0.4  0.0 - 1.2 mg/dL Final   GFR, Estimated 03/28/2023 >60  >60 mL/min Final   Comment: (NOTE) Calculated using the CKD-EPI Creatinine Equation (2021)    Anion gap 03/28/2023 12  5 - 15 Final   Performed at [facility], [address] [place], [place] [address]   Alcohol, Ethyl (B) 03/28/2023 14 (H)  <10 mg/dL Final   Comment:  (NOTE) Lowest detectable limit for serum alcohol is 10 mg/dL.  For medical purposes only. Performed at [facility], [address], [place], [place] [address]    Salicylate Lvl 03/28/2023 <7.0 (L)  7.0 - 30.0 mg/dL Final   Performed at [facility], [address] [place], [place] [address]   Acetaminophen  (Tylenol ), Serum 03/28/2023 <10 (L)  10 - 30 ug/mL Final   Comment: (NOTE) Therapeutic concentrations vary significantly. A range of 10-30 ug/mL  may be an effective concentration for many patients. However, some  are best treated at concentrations outside of this range. Acetaminophen  concentrations >150 ug/mL at 4 hours after ingestion  and >50 ug/mL at 12 hours after ingestion are often associated with  toxic reactions.  Performed at [facility] [address], [place], [place] [address]    WBC 03/28/2023 6.1  4.0 - 10.5 K/uL Final   RBC 03/28/2023 4.55  4.22 - 5.81 MIL/uL Final   Hemoglobin 03/28/2023 14.2  13.0 - 17.0 g/dL Final   HCT 56/38/7564 42.1  39.0 - 52.0 % Final   MCV 03/28/2023 92.5  80.0 - 100.0 fL Final   MCH 03/28/2023 31.2  26.0 - 34.0 pg Final   MCHC 03/28/2023 33.7  30.0 - 36.0 g/dL Final   RDW 33/29/5188 12.8  11.5 - 15.5 % Final   Platelets 03/28/2023 383  150 - 400 K/uL Final   nRBC 03/28/2023 0.0  0.0 - 0.2 % Final   Performed at [facility], [address], [place], [place] [address]   Opiates 03/28/2023 NONE DETECTED  NONE DETECTED Final   Cocaine 03/28/2023 NONE DETECTED  NONE DETECTED Final   Benzodiazepines 03/28/2023 NONE DETECTED  NONE DETECTED Final   Amphetamines 03/28/2023 NONE DETECTED  NONE DETECTED Final   Tetrahydrocannabinol 03/28/2023 POSITIVE (A)  NONE DETECTED Final   Barbiturates 03/28/2023 NONE DETECTED  NONE DETECTED Final   Comment: (NOTE) DRUG SCREEN FOR MEDICAL PURPOSES ONLY.  IF CONFIRMATION IS NEEDED FOR ANY PURPOSE, NOTIFY LAB WITHIN 5  DAYS.  LOWEST DETECTABLE LIMITS FOR URINE DRUG SCREEN Drug Class                     Cutoff (ng/mL) Amphetamine and metabolites    1000 Barbiturate and metabolites    200 Benzodiazepine  200 Opiates and metabolites        300 Cocaine and metabolites        300 THC                            50 Performed at [facility], [address], [place], [place] [address]   Admission on 03/13/2023, Discharged on 03/13/2023  Component Date Value Ref Range Status   Sodium 03/13/2023 135  135 - 145 mmol/L Final   Potassium 03/13/2023 3.9  3.5 - 5.1 mmol/L Final   Chloride 03/13/2023 99  98 - 111 mmol/L Final   CO2 03/13/2023 28  22 - 32 mmol/L Final   Glucose, Bld 03/13/2023 120 (H)  70 - 99 mg/dL Final   Glucose reference range applies only to samples taken after fasting for at least 8 hours.   BUN 03/13/2023 5 (L)  6 - 20 mg/dL Final   Creatinine, Ser 03/13/2023 0.70  0.61 - 1.24 mg/dL Final   Calcium 60/45/4098 8.7 (L)  8.9 - 10.3 mg/dL Final   Total Protein 11/91/4782 7.1  6.5 - 8.1 g/dL Final   Albumin 95/62/1308 4.3  3.5 - 5.0 g/dL Final   AST 65/78/4696 22  15 - 41 U/L Final   ALT 03/13/2023 11  0 - 44 U/L Final   Alkaline Phosphatase 03/13/2023 55  38 - 126 U/L Final   Total Bilirubin 03/13/2023 0.8  0.0 - 1.2 mg/dL Final   GFR, Estimated 03/13/2023 >60  >60 mL/min Final   Comment: (NOTE) Calculated using the CKD-EPI Creatinine Equation (2021)    Anion gap 03/13/2023 8  5 - 15 Final   Performed at [facility], [address], [place], [place] [address]   Alcohol, Ethyl (B) 03/13/2023 <10  <10 mg/dL Final   Comment: (NOTE) Lowest detectable limit for serum alcohol is 10 mg/dL.  For medical purposes only. Performed at [facility], [address] [place], [place] [address]    WBC 03/13/2023 10.9 (H)  4.0 - 10.5 K/uL Final   RBC 03/13/2023 4.81  4.22 - 5.81 MIL/uL Final   Hemoglobin 03/13/2023 14.8  13.0  - 17.0 g/dL Final   HCT 40/11/2723 43.5  39.0 - 52.0 % Final   MCV 03/13/2023 90.4  80.0 - 100.0 fL Final   MCH 03/13/2023 30.8  26.0 - 34.0 pg Final   MCHC 03/13/2023 34.0  30.0 - 36.0 g/dL Final   RDW 36/64/4034 12.2  11.5 - 15.5 % Final   Platelets 03/13/2023 288  150 - 400 K/uL Final   nRBC 03/13/2023 0.0  0.0 - 0.2 % Final   Performed at [facility], [address], [place], [place] [address]   Opiates 03/13/2023 NONE DETECTED  NONE DETECTED Final   Cocaine 03/13/2023 NONE DETECTED  NONE DETECTED Final   Benzodiazepines 03/13/2023 POSITIVE (A)  NONE DETECTED Final   Amphetamines 03/13/2023 NONE DETECTED  NONE DETECTED Final   Tetrahydrocannabinol 03/13/2023 POSITIVE (A)  NONE DETECTED Final   Barbiturates 03/13/2023 NONE DETECTED  NONE DETECTED Final   Comment: (NOTE) DRUG SCREEN FOR MEDICAL PURPOSES ONLY.  IF CONFIRMATION IS NEEDED FOR ANY PURPOSE, NOTIFY LAB WITHIN 5 DAYS.  LOWEST DETECTABLE LIMITS FOR URINE DRUG SCREEN Drug Class                     Cutoff (ng/mL) Amphetamine and metabolites    1000 Barbiturate  and metabolites    200 Benzodiazepine                 200 Opiates and metabolites        300 Cocaine and metabolites        300 THC                            50 Performed at [facility], [address], [place], [place] [address]     Allergies: Patient has no known allergies.  Medications:  Facility Ordered Medications  Medication   acetaminophen  (TYLENOL ) tablet 650 mg   alum & mag hydroxide-simeth (MAALOX/MYLANTA) 200-200-20 MG/5ML suspension 30 mL   magnesium  hydroxide (MILK OF MAGNESIA) suspension 30 mL   haloperidol  (HALDOL ) tablet 5 mg   And   diphenhydrAMINE  (BENADRYL ) capsule 50 mg   haloperidol  lactate (HALDOL ) injection 5 mg   And   diphenhydrAMINE  (BENADRYL ) injection 50 mg   And   LORazepam  (ATIVAN ) injection 2 mg   haloperidol  lactate (HALDOL ) injection 10 mg   And   diphenhydrAMINE  (BENADRYL )  injection 50 mg   And   LORazepam  (ATIVAN ) injection 2 mg   traZODone  (DESYREL ) tablet 50 mg   gabapentin  (NEURONTIN ) capsule 300 mg   venlafaxine  XR (EFFEXOR -XR) 24 hr capsule 150 mg   QUEtiapine  (SEROQUEL ) tablet 100 mg   traZODone  (DESYREL ) tablet 50 mg   nicotine  (NICODERM CQ  - dosed in mg/24 hours) patch 21 mg   PTA Medications  Medication Sig   desvenlafaxine  (PRISTIQ ) 100 MG 24 hr tablet Take 1 tablet (100 mg total) by mouth daily.   nicotine  (NICODERM CQ  - DOSED IN MG/24 HOURS) 14 mg/24hr patch Place 1 patch (14 mg total) onto the skin daily.   QUEtiapine  (SEROQUEL ) 100 MG tablet Take 1 tablet (100 mg total) by mouth at bedtime.   gabapentin  (NEURONTIN ) 300 MG capsule Take 1 capsule (300 mg total) by mouth 3 (three) times daily.   traZODone  (DESYREL ) 50 MG tablet Take 1 tablet (50 mg total) by mouth at bedtime as needed for sleep.   acetaminophen  (TYLENOL ) 500 MG tablet Take 500 mg by mouth every 6 (six) hours as needed for moderate pain (pain score 4-6).    Long Term Goals: Improvement in symptoms so as ready for discharge  Short Term Goals: Patient will verbalize feelings in meetings with treatment team members., Patient will attend at least of 50% of the groups daily., Pt will complete the PHQ9 on admission, day 3 and discharge., Patient will participate in completing the [place] Suicide Severity Rating Scale, Patient will score a low risk of violence for 24 hours prior to discharge, and Patient will take medications as prescribed daily.  Medical Decision Making   Status: Voluntary  Alcohol use disorder, cocaine abuse - No withdrawal currently - No previous substance use treatment history - Restart gabapentin  for anxiety and alcohol cravings - Residential rehab placement   Depression - Unable to prescribe Pristiq , start Effexor  150 mg - Start Seroquel  100 mg   Lab work and EKG unremarkable: Sodium of 133 and white blood cells of 12   Recommendations  I certify  that this patient does not appear to have an emergency medical condition.  Marilou Showman, MD PGY-3

## 2023-06-06 DIAGNOSIS — F1410 Cocaine abuse, uncomplicated: Secondary | ICD-10-CM | POA: Diagnosis not present

## 2023-06-06 DIAGNOSIS — F1010 Alcohol abuse, uncomplicated: Secondary | ICD-10-CM | POA: Diagnosis not present

## 2023-06-06 DIAGNOSIS — F329 Major depressive disorder, single episode, unspecified: Secondary | ICD-10-CM | POA: Diagnosis not present

## 2023-06-06 DIAGNOSIS — R45851 Suicidal ideations: Secondary | ICD-10-CM | POA: Diagnosis not present

## 2023-06-06 MED ORDER — HYDROXYZINE HCL 25 MG PO TABS
25.0000 mg | ORAL_TABLET | Freq: Three times a day (TID) | ORAL | Status: DC | PRN
  Administered 2023-06-06 – 2023-06-07 (×3): 25 mg via ORAL
  Filled 2023-06-06: qty 20
  Filled 2023-06-06 (×3): qty 1

## 2023-06-06 NOTE — ED Notes (Signed)
 PRN Tylenol  given due to patient reports of toothache initially scoring a 4/10 now increased to 5/10. PRN Orajel given at 0851 and ineffective. Medication administered with no complications. Environment secured, safety checks in place per facility policy.

## 2023-06-06 NOTE — ED Notes (Signed)
 Patient is sleeping. Respirations equal and unlabored, skin warm and dry. No change in assessment or acuity. Routine safety checks conducted according to facility protocol. Will continue to monitor for safety.

## 2023-06-06 NOTE — ED Notes (Signed)
 Patient resting with eyes closed in no apparent acute distress. Respirations even and unlabored. Environment secured. Safety checks in place according to facility policy.

## 2023-06-06 NOTE — ED Notes (Signed)
 PRN Orajel given due to patient reports of toothache rating 4/10. Medication administered with no complications. Environment secured, safety checks in place per facility policy.

## 2023-06-06 NOTE — Group Note (Signed)
 Group Topic: Healthy Self Image and Positive Change  Group Date: 06/06/2023 Start Time: 0930 End Time: 1015 Facilitators: Rozetta Corns  Department: [facility]  Number of Participants: 6  Group Focus: self-awareness Treatment Modality:  Psychoeducation Interventions utilized were patient education Purpose: increase insight  Name: Michael [name] Date of Birth: March 08, 1975  MR: [id number]    Level of Participation: active Quality of Participation: attentive, cooperative, engaged, motivated, and offered feedback Interactions with others: gave feedback Mood/Affect: appropriate, bright, and positive Triggers (if applicable): n/a Cognition: coherent/clear, concrete, goal directed, and insightful Progress: Gaining insight Response: n/a Plan: patient will be encouraged to continue to attend group  Patients Problems:  Patient Active Problem List   Diagnosis Date Noted   MDD (major depressive disorder), recurrent severe, without psychosis (HCC) 05/26/2023   MDD (major depressive disorder) 03/13/2023   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

## 2023-06-06 NOTE — ED Notes (Signed)
 Patient is in the dayroom calm and composed watching TV with other patients.NAD. Denies needing anything. Environment secured and safe. Will keep monitoring for safety.

## 2023-06-06 NOTE — ED Notes (Signed)
 PRN Atarax given due to patient reports of anxiety. Medication administered with no complications. Environment secured, safety checks in place per facility policy.
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
## 2023-06-06 NOTE — ED Notes (Signed)
 Patient in the bedroom asleep with eyes closed. NAD. Respirations are even and unlabored. Environment secured per policy. Will monitoring for safety

## 2023-06-06 NOTE — ED Notes (Signed)
 Patient alert & oriented x4. Denies intent to harm self or others when asked. Denies A/VH. Patient reports toothache initially rating a 4/10 that has increased to a 5/10. PRN Orajel given and ineffective, PRN Tylenol  given at 0928. Scheduled and PRN medications administered with no complications.No acute distress noted. Support and encouragement provided. Routine safety checks conducted per facility protocol. Encouraged patient to notify staff if any thoughts of harm towards self or others arise. Patient verbalizes understanding and agreement.

## 2023-06-06 NOTE — Group Note (Signed)
 Group Topic: Positive Affirmations  Group Date: 06/06/2023 Start Time: 2000 End Time: 2030 Facilitators: Bonnie Butters  Department: [facility]  Number of Participants: 5  Group Focus: check in Treatment Modality:  Skills Training Interventions utilized were support Purpose: reinforce self-care  Name: Michael [name] Date of Birth: 1975-02-16  MR: [id number]    Level of Participation: active Quality of Participation: cooperative Interactions with others: gave feedback Mood/Affect: appropriate Triggers (if applicable):  Cognition: goal directed Progress: Gaining insight Response: Pt wants to get back on track by staying away from triggers Plan: patient will be encouraged to follow up with goals.  Patients Problems:  Patient Active Problem List   Diagnosis Date Noted   MDD (major depressive disorder), recurrent severe, without psychosis (HCC) 05/26/2023   MDD (major depressive disorder) 03/13/2023   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

## 2023-06-07 DIAGNOSIS — R45851 Suicidal ideations: Secondary | ICD-10-CM | POA: Diagnosis not present

## 2023-06-07 DIAGNOSIS — F329 Major depressive disorder, single episode, unspecified: Secondary | ICD-10-CM | POA: Diagnosis not present

## 2023-06-07 DIAGNOSIS — F1010 Alcohol abuse, uncomplicated: Secondary | ICD-10-CM | POA: Diagnosis not present

## 2023-06-07 DIAGNOSIS — F1410 Cocaine abuse, uncomplicated: Secondary | ICD-10-CM | POA: Diagnosis not present

## 2023-06-07 MED ORDER — VENLAFAXINE HCL ER 150 MG PO CP24
150.0000 mg | ORAL_CAPSULE | Freq: Every day | ORAL | 0 refills | Status: AC
Start: 2023-06-08 — End: ?

## 2023-06-07 MED ORDER — BENZOCAINE 10 % MT GEL: 1.0000 | Freq: Two times a day (BID) | OROMUCOSAL | 0 refills | Status: AC | PRN

## 2023-06-07 MED ORDER — QUETIAPINE FUMARATE 100 MG PO TABS: 100.0000 mg | ORAL_TABLET | Freq: Every day | ORAL | 0 refills | Status: AC

## 2023-06-07 MED ORDER — TRAZODONE HCL 50 MG PO TABS: 50.0000 mg | ORAL_TABLET | Freq: Every evening | ORAL | 0 refills | Status: AC | PRN

## 2023-06-07 MED ORDER — HYDROXYZINE HCL 25 MG PO TABS: 25.0000 mg | ORAL_TABLET | Freq: Three times a day (TID) | ORAL | 0 refills | Status: AC | PRN

## 2023-06-07 MED ORDER — NICOTINE 21 MG/24HR TD PT24: 21.0000 mg | MEDICATED_PATCH | Freq: Every day | TRANSDERMAL | 0 refills | Status: AC

## 2023-06-07 MED ORDER — GABAPENTIN 300 MG PO CAPS: 300.0000 mg | ORAL_CAPSULE | Freq: Three times a day (TID) | ORAL | 0 refills | Status: AC

## 2023-06-07 NOTE — Discharge Planning (Signed)
 Pt was given a list of items needed for [facility] admission tomorrow and will be transported via taxi with voucher by 8AM for admission to facility before 9AM. He will need 7-14 days of meds sent with a 30 day script.

## 2023-06-07 NOTE — ED Notes (Signed)
 Pt reports tooth pain of 3/10. Tylenol  650mg  and orajel administered. Pain will be reassessed after 1 hour.

## 2023-06-07 NOTE — Discharge Planning (Signed)
 SW [facility] to the [facility] and spoke with Myrtie Atkinson to relay message for intake specialist, Celestine Colder to return call to this SW regarding a referral for this pt seeking residential care. He stated he would give the message to her and have her return my call today, as stated urgent and explained that this patient was accepted to another residential care program but wanted to make sure to explore this option as his first choice at the healing place. He is accepted and set up for admission to [facility] tomorrow and agreeable to this plan, if the healing place can not accept patient. He is anticipated to be ready for DC tomorrow. Will continue to follow.

## 2023-06-07 NOTE — ED Provider Notes (Signed)
 Behavioral Health Progress Note  Date and Time: 06/07/2023 2:06 PM Name: Michael [name] MRN:  [id number]  Subjective:  Arron was seen in the common area this afternoon. He recalls this interviewer from his stay at [facility].  He is relieved to be off the xanax , but admits that he was using alcohol to cope with his anxiety. He has been feeling like "this is my last chance" and that if he doesn't quit he will die. He does not want to die or harm self, but appears to have some future foreshortening. No hallucinations or paranoia. He is sleeping and eating okay. No withdrawal today. He is able to talk about his spiritual beliefs some and feeling as if he doesn't have a place in the world anymore.   Diagnosis:  Final diagnoses:  Alcohol use disorder  Other depression    Total Time spent with patient: 30 minutes  Past Psychiatric History: See H&P and above Past Medical History: See H&P and above Family History: See H&P and above Family Psychiatric  History: See H&P and above Social History: See H&P and above     Sleep: Fair  Appetite:  Fair  Current Medications:  Current Facility-Administered Medications  Medication Dose Route Frequency Provider Last Rate Last Admin   acetaminophen  (TYLENOL ) tablet 650 mg  650 mg Oral Q6H PRN Motley-Mangrum, Jadeka A, PMHNP   650 mg at 06/06/23 0928   alum & mag hydroxide-simeth (MAALOX/MYLANTA) 200-200-20 MG/5ML suspension 30 mL  30 mL Oral Q4H PRN Motley-Mangrum, Jadeka A, PMHNP       benzocaine (ORAJEL) 10 % mucosal gel 1 Application  1 Application Mouth/Throat BID PRN Marilou Showman, MD   1 Application at 06/06/23 [phone]   haloperidol  (HALDOL ) tablet 5 mg  5 mg Oral TID PRN Motley-Mangrum, Jadeka A, PMHNP       And   diphenhydrAMINE  (BENADRYL ) capsule 50 mg  50 mg Oral TID PRN Motley-Mangrum, Jadeka A, PMHNP       haloperidol  lactate (HALDOL ) injection 5 mg  5 mg Intramuscular TID PRN Motley-Mangrum, Jadeka A, PMHNP       And   diphenhydrAMINE  (BENADRYL )  injection 50 mg  50 mg Intramuscular TID PRN Motley-Mangrum, Jadeka A, PMHNP       And   LORazepam  (ATIVAN ) injection 2 mg  2 mg Intramuscular TID PRN Motley-Mangrum, Jadeka A, PMHNP       haloperidol  lactate (HALDOL ) injection 10 mg  10 mg Intramuscular TID PRN Motley-Mangrum, Jadeka A, PMHNP       And   diphenhydrAMINE  (BENADRYL ) injection 50 mg  50 mg Intramuscular TID PRN Motley-Mangrum, Jadeka A, PMHNP       And   LORazepam  (ATIVAN ) injection 2 mg  2 mg Intramuscular TID PRN Motley-Mangrum, Jadeka A, PMHNP       gabapentin  (NEURONTIN ) capsule 300 mg  300 mg Oral TID Marilou Showman, MD   300 mg at 06/07/23 1191   hydrOXYzine  (ATARAX ) tablet 25 mg  25 mg Oral TID PRN Hoang, Daniela B, MD   25 mg at 06/06/23 2111   LORazepam  (ATIVAN ) tablet 1 mg  1 mg Oral Q6H PRN Marilou Showman, MD       magnesium  hydroxide (MILK OF MAGNESIA) suspension 30 mL  30 mL Oral Daily PRN Motley-Mangrum, Jadeka A, PMHNP       nicotine  (NICODERM CQ  - dosed in mg/24 hours) patch 21 mg  21 mg Transdermal Daily Marilou Showman, MD   21 mg at 06/07/23 4782   QUEtiapine  (SEROQUEL )  tablet 100 mg  100 mg Oral QHS Marilou Showman, MD   100 mg at 06/06/23 2111   traZODone  (DESYREL ) tablet 50 mg  50 mg Oral QHS PRN Motley-Mangrum, Jadeka A, PMHNP   50 mg at 06/06/23 2111   traZODone  (DESYREL ) tablet 50 mg  50 mg Oral QHS PRN Marilou Showman, MD   50 mg at 06/05/23 2118   venlafaxine  XR (EFFEXOR -XR) 24 hr capsule 150 mg  150 mg Oral Q breakfast Marilou Showman, MD   150 mg at 06/07/23 2440   Current Outpatient Medications  Medication Sig Dispense Refill   acetaminophen  (TYLENOL ) 500 MG tablet Take 500 mg by mouth every 6 (six) hours as needed for moderate pain (pain score 4-6).     benzocaine (ORAJEL) 10 % mucosal gel Use as directed 1 Application in the mouth or throat 2 (two) times daily as needed for mouth pain. 5.3 g 0   gabapentin  (NEURONTIN ) 300 MG capsule Take 1 capsule (300 mg total) by mouth 3 (three) times daily. 90  capsule 0   hydrOXYzine  (ATARAX ) 25 MG tablet Take 1 tablet (25 mg total) by mouth 3 (three) times daily as needed for anxiety. 30 tablet 0   [START ON 06/08/2023] nicotine  (NICODERM CQ  - DOSED IN MG/24 HOURS) 21 mg/24hr patch Place 1 patch (21 mg total) onto the skin daily. 28 patch 0   QUEtiapine  (SEROQUEL ) 100 MG tablet Take 1 tablet (100 mg total) by mouth at bedtime. 30 tablet 0   traZODone  (DESYREL ) 50 MG tablet Take 1 tablet (50 mg total) by mouth at bedtime as needed for sleep. 30 tablet 0   [START ON 06/08/2023] venlafaxine  XR (EFFEXOR -XR) 150 MG 24 hr capsule Take 1 capsule (150 mg total) by mouth daily with breakfast. 30 capsule 0    Labs  Lab Results:  Admission on 06/02/2023, Discharged on 06/03/2023  Component Date Value Ref Range Status   Sodium 06/02/2023 133 (L)  135 - 145 mmol/L Final   Potassium 06/02/2023 4.0  3.5 - 5.1 mmol/L Final   Chloride 06/02/2023 96 (L)  98 - 111 mmol/L Final   CO2 06/02/2023 25  22 - 32 mmol/L Final   Glucose, Bld 06/02/2023 111 (H)  70 - 99 mg/dL Final   Glucose reference range applies only to samples taken after fasting for at least 8 hours.   BUN 06/02/2023 21 (H)  6 - 20 mg/dL Final   Creatinine, Ser 06/02/2023 0.93  0.61 - 1.24 mg/dL Final   Calcium 12/05/2534 9.2  8.9 - 10.3 mg/dL Final   Total Protein 64/40/3474 8.7 (H)  6.5 - 8.1 g/dL Final   Albumin 25/95/6387 4.5  3.5 - 5.0 g/dL Final   AST 56/43/3295 18  15 - 41 U/L Final   ALT 06/02/2023 15  0 - 44 U/L Final   Alkaline Phosphatase 06/02/2023 82  38 - 126 U/L Final   Total Bilirubin 06/02/2023 1.0  0.0 - 1.2 mg/dL Final   GFR, Estimated 06/02/2023 >60  >60 mL/min Final   Comment: (NOTE) Calculated using the CKD-EPI Creatinine Equation (2021)    Anion gap 06/02/2023 12  5 - 15 Final   Performed at [facility], [address], [place], [place] [address]   Alcohol, Ethyl (B) 06/02/2023 <15  <15 mg/dL Final   Comment: Please note change in reference  range. (NOTE) For medical purposes only. Performed at [facility], [address] [place], [place] [address]    WBC 06/02/2023  12.2 (H)  4.0 - 10.5 K/uL Final   RBC 06/02/2023 4.96  4.22 - 5.81 MIL/uL Final   Hemoglobin 06/02/2023 15.4  13.0 - 17.0 g/dL Final   HCT 09/81/1914 47.0  39.0 - 52.0 % Final   MCV 06/02/2023 94.8  80.0 - 100.0 fL Final   MCH 06/02/2023 31.0  26.0 - 34.0 pg Final   MCHC 06/02/2023 32.8  30.0 - 36.0 g/dL Final   RDW 78/29/5621 13.1  11.5 - 15.5 % Final   Platelets 06/02/2023 352  150 - 400 K/uL Final   nRBC 06/02/2023 0.0  0.0 - 0.2 % Final   Performed at [facility], [address] [place], [place] [address]   Opiates 06/02/2023 NONE DETECTED  NONE DETECTED Final   Cocaine 06/02/2023 POSITIVE (A)  NONE DETECTED Final   Benzodiazepines 06/02/2023 NONE DETECTED  NONE DETECTED Final   Amphetamines 06/02/2023 NONE DETECTED  NONE DETECTED Final   Tetrahydrocannabinol 06/02/2023 NONE DETECTED  NONE DETECTED Final   Barbiturates 06/02/2023 NONE DETECTED  NONE DETECTED Final   Comment: (NOTE) DRUG SCREEN FOR MEDICAL PURPOSES ONLY.  IF CONFIRMATION IS NEEDED FOR ANY PURPOSE, NOTIFY LAB WITHIN 5 DAYS.  LOWEST DETECTABLE LIMITS FOR URINE DRUG SCREEN Drug Class                     Cutoff (ng/mL) Amphetamine and metabolites    1000 Barbiturate and metabolites    200 Benzodiazepine                 200 Opiates and metabolites        300 Cocaine and metabolites        300 THC                            50 Performed at [facility], [address], [place], [place] [address]   Admission on 05/26/2023, Discharged on 05/31/2023  Component Date Value Ref Range Status   Folate 05/27/2023 9.9  >5.9 ng/mL Final   Performed at [facility], [address], [place], [place] [address]   RPR Ser Ql 05/27/2023 NON REACTIVE  NON REACTIVE Final   Performed at [facility], [address], [place], [place] [address]   TSH 05/27/2023 2.185  0.350 - 4.500 uIU/mL Final   Comment: Performed by a 3rd Generation assay with a functional sensitivity of <=0.01 uIU/mL. Performed at [facility], [address], [place], [place] [address]    Vitamin B-12 05/27/2023 173 (L)  180 - 914 pg/mL Final   Comment: (NOTE) This assay is not validated for testing neonatal or myeloproliferative syndrome specimens for Vitamin B12 levels. Performed at [facility], [address], [place], [place] [address]    Vit D, 25-Hydroxy 05/27/2023 35.74  30 - 100 ng/mL Final   Comment: (NOTE) Vitamin D  deficiency has been defined by the Institute of Medicine  and an Endocrine Society practice guideline as a level of serum 25-OH  vitamin D  less than 20 ng/mL (1,2). The Endocrine Society went on to  further define vitamin D  insufficiency as a level between 21 and 29  ng/mL (2).  1. IOM (Institute of Medicine). 2010. Dietary reference intakes for  calcium and D. [place]: [employer]. 2. Holick MF, Binkley Buna, Bischoff-Ferrari HA, et al. Evaluation,  treatment, and prevention of vitamin D  deficiency: an Endocrine  Society clinical practice guideline, JCEM. 2011 Jul; 96(7): 1911-30.  Performed at [facility], [address] [place], [place] [address]    Sodium 05/27/2023 138  135 - 145 mmol/L Final   Potassium 05/27/2023 3.8  3.5 - 5.1 mmol/L Final   Chloride 05/27/2023 103  98 - 111 mmol/L Final   CO2 05/27/2023 25  22 - 32 mmol/L Final   Glucose, Bld 05/27/2023 109 (H)  70 - 99 mg/dL Final   Glucose reference range applies only to samples taken after fasting for at least 8 hours.   BUN 05/27/2023 13  6 - 20 mg/dL Final   Creatinine, Ser 05/27/2023 0.91  0.61 - 1.24 mg/dL Final   Calcium 91/47/8295 8.8 (L)  8.9 - 10.3 mg/dL Final   GFR, Estimated 05/27/2023 >60  >60 mL/min Final   Comment: (NOTE) Calculated using the CKD-EPI  Creatinine Equation (2021)    Anion gap 05/27/2023 10  5 - 15 Final   Performed at [facility], [address] [place], [place] [address]  Admission on 05/26/2023, Discharged on 05/26/2023  Component Date Value Ref Range Status   Sodium 05/26/2023 138  135 - 145 mmol/L Final   Potassium 05/26/2023 3.1 (L)  3.5 - 5.1 mmol/L Final   Chloride 05/26/2023 101  98 - 111 mmol/L Final   CO2 05/26/2023 25  22 - 32 mmol/L Final   Glucose, Bld 05/26/2023 98  70 - 99 mg/dL Final   Glucose reference range applies only to samples taken after fasting for at least 8 hours.   BUN 05/26/2023 11  6 - 20 mg/dL Final   Creatinine, Ser 05/26/2023 0.57 (L)  0.61 - 1.24 mg/dL Final   Calcium 86/57/8469 8.7 (L)  8.9 - 10.3 mg/dL Final   Total Protein 62/95/2841 7.6  6.5 - 8.1 g/dL Final   Albumin 32/44/0102 4.2  3.5 - 5.0 g/dL Final   AST 72/53/6644 18  15 - 41 U/L Final   ALT 05/26/2023 14  0 - 44 U/L Final   Alkaline Phosphatase 05/26/2023 55  38 - 126 U/L Final   Total Bilirubin 05/26/2023 0.9  0.0 - 1.2 mg/dL Final   GFR, Estimated 05/26/2023 >60  >60 mL/min Final   Comment: (NOTE) Calculated using the CKD-EPI Creatinine Equation (2021)    Anion gap 05/26/2023 12  5 - 15 Final   Performed at [facility], [address] [place], [place] [address]   Alcohol, Ethyl (B) 05/26/2023 <10  <10 mg/dL Final   Comment: (NOTE) For medical purposes only. Performed at [facility], [address], [place], [place] [address]    Opiates 05/26/2023 NONE DETECTED  NONE DETECTED Final   Cocaine 05/26/2023 NONE DETECTED  NONE DETECTED Final   Benzodiazepines 05/26/2023 NONE DETECTED  NONE DETECTED Final   Amphetamines 05/26/2023 NONE DETECTED  NONE DETECTED Final   Tetrahydrocannabinol 05/26/2023 NONE DETECTED  NONE DETECTED Final   Barbiturates 05/26/2023 NONE DETECTED  NONE DETECTED Final   Comment: (NOTE) DRUG SCREEN FOR MEDICAL PURPOSES ONLY.  IF  CONFIRMATION IS NEEDED FOR ANY PURPOSE, NOTIFY LAB WITHIN 5 DAYS.  LOWEST DETECTABLE LIMITS FOR URINE DRUG SCREEN Drug Class                     Cutoff (ng/mL) Amphetamine and metabolites    1000 Barbiturate and metabolites    200 Benzodiazepine  200 Opiates and metabolites        300 Cocaine and metabolites        300 THC                            50 Performed at [facility], [address], [place], [place] [address]    WBC 05/26/2023 6.9  4.0 - 10.5 K/uL Final   RBC 05/26/2023 4.46  4.22 - 5.81 MIL/uL Final   Hemoglobin 05/26/2023 14.0  13.0 - 17.0 g/dL Final   HCT 93/71/6967 42.3  39.0 - 52.0 % Final   MCV 05/26/2023 94.8  80.0 - 100.0 fL Final   MCH 05/26/2023 31.4  26.0 - 34.0 pg Final   MCHC 05/26/2023 33.1  30.0 - 36.0 g/dL Final   RDW 89/38/1017 13.2  11.5 - 15.5 % Final   Platelets 05/26/2023 276  150 - 400 K/uL Final   nRBC 05/26/2023 0.0  0.0 - 0.2 % Final   Neutrophils Relative % 05/26/2023 58  % Final   Neutro Abs 05/26/2023 4.0  1.7 - 7.7 K/uL Final   Lymphocytes Relative 05/26/2023 31  % Final   Lymphs Abs 05/26/2023 2.1  0.7 - 4.0 K/uL Final   Monocytes Relative 05/26/2023 8  % Final   Monocytes Absolute 05/26/2023 0.5  0.1 - 1.0 K/uL Final   Eosinophils Relative 05/26/2023 2  % Final   Eosinophils Absolute 05/26/2023 0.1  0.0 - 0.5 K/uL Final   Basophils Relative 05/26/2023 1  % Final   Basophils Absolute 05/26/2023 0.1  0.0 - 0.1 K/uL Final   Immature Granulocytes 05/26/2023 0  % Final   Abs Immature Granulocytes 05/26/2023 0.02  0.00 - 0.07 K/uL Final   Performed at [facility], [address], [place], [place] [address]  Admission on 03/28/2023, Discharged on 04/04/2023  Component Date Value Ref Range Status   TSH 03/30/2023 2.019  0.350 - 4.500 uIU/mL Final   Comment: Performed by a 3rd Generation assay with a functional sensitivity of <=0.01 uIU/mL. Performed at [facility],  [address], [place], [place] [address]    Hgb A1c MFr Bld 03/30/2023 5.2  4.8 - 5.6 % Final   Comment: (NOTE) Pre diabetes:          5.7%-6.4%  Diabetes:              >6.4%  Glycemic control for   <7.0% adults with diabetes    Mean Plasma Glucose 03/30/2023 102.54  mg/dL Final   Performed at [facility], [address], [place], [place] [address]   Cholesterol 03/30/2023 254 (H)  0 - 200 mg/dL Final   Triglycerides 53/61/4431 144  <150 mg/dL Final   HDL 54/00/8676 65  >40 mg/dL Final   Total CHOL/HDL Ratio 03/30/2023 3.9  RATIO Final   VLDL 03/30/2023 29  0 - 40 mg/dL Final   LDL Cholesterol 03/30/2023 160 (H)  0 - 99 mg/dL Final   Comment:        Total Cholesterol/HDL:CHD Risk Coronary Heart Disease Risk Table                     Men   Women  1/2 Average Risk   3.4   3.3  Average Risk       5.0   4.4  2 X Average Risk   9.6   7.1  3 X  Average Risk  23.4   11.0        Use the calculated Patient Ratio above and the CHD Risk Table to determine the patient's CHD Risk.        ATP III CLASSIFICATION (LDL):  <100     mg/dL   Optimal  161-096  mg/dL   Near or Above                    Optimal  130-159  mg/dL   Borderline  045-409  mg/dL   High  >811     mg/dL   Very High Performed at [facility], [address] [place], [place] [address]   Admission on 03/28/2023, Discharged on 03/28/2023  Component Date Value Ref Range Status   Sodium 03/28/2023 139  135 - 145 mmol/L Final   Potassium 03/28/2023 3.4 (L)  3.5 - 5.1 mmol/L Final   Chloride 03/28/2023 103  98 - 111 mmol/L Final   CO2 03/28/2023 24  22 - 32 mmol/L Final   Glucose, Bld 03/28/2023 106 (H)  70 - 99 mg/dL Final   Glucose reference range applies only to samples taken after fasting for at least 8 hours.   BUN 03/28/2023 6  6 - 20 mg/dL Final   Creatinine, Ser 03/28/2023 0.75  0.61 - 1.24 mg/dL Final   Calcium 29/56/2130 8.8 (L)  8.9 - 10.3 mg/dL Final   Total Protein 86/57/8469 7.4   6.5 - 8.1 g/dL Final   Albumin 62/95/2841 3.8  3.5 - 5.0 g/dL Final   AST 32/44/0102 18  15 - 41 U/L Final   ALT 03/28/2023 15  0 - 44 U/L Final   Alkaline Phosphatase 03/28/2023 55  38 - 126 U/L Final   Total Bilirubin 03/28/2023 0.4  0.0 - 1.2 mg/dL Final   GFR, Estimated 03/28/2023 >60  >60 mL/min Final   Comment: (NOTE) Calculated using the CKD-EPI Creatinine Equation (2021)    Anion gap 03/28/2023 12  5 - 15 Final   Performed at [facility], [address], [place], [place] [address]   Alcohol, Ethyl (B) 03/28/2023 14 (H)  <10 mg/dL Final   Comment: (NOTE) Lowest detectable limit for serum alcohol is 10 mg/dL.  For medical purposes only. Performed at [facility], [address], [place], [place] [address]    Salicylate Lvl 03/28/2023 <7.0 (L)  7.0 - 30.0 mg/dL Final   Performed at [facility], [address], [place], [place] [address]   Acetaminophen  (Tylenol ), Serum 03/28/2023 <10 (L)  10 - 30 ug/mL Final   Comment: (NOTE) Therapeutic concentrations vary significantly. A range of 10-30 ug/mL  may be an effective concentration for many patients. However, some  are best treated at concentrations outside of this range. Acetaminophen  concentrations >150 ug/mL at 4 hours after ingestion  and >50 ug/mL at 12 hours after ingestion are often associated with  toxic reactions.  Performed at [facility], [address] [place], [place] [address]    WBC 03/28/2023 6.1  4.0 - 10.5 K/uL Final   RBC 03/28/2023 4.55  4.22 - 5.81 MIL/uL Final   Hemoglobin 03/28/2023 14.2  13.0 - 17.0 g/dL Final   HCT 75/64/3329 42.1  39.0 - 52.0 % Final   MCV 03/28/2023 92.5  80.0 - 100.0 fL Final   MCH 03/28/2023 31.2  26.0 - 34.0 pg Final   MCHC 03/28/2023 33.7  30.0 - 36.0 g/dL Final  RDW 03/28/2023 12.8  11.5 - 15.5 % Final   Platelets 03/28/2023 383  150 - 400 K/uL Final   nRBC 03/28/2023 0.0  0.0 - 0.2 % Final    Performed at [facility], [address] [place], [place] [address]   Opiates 03/28/2023 NONE DETECTED  NONE DETECTED Final   Cocaine 03/28/2023 NONE DETECTED  NONE DETECTED Final   Benzodiazepines 03/28/2023 NONE DETECTED  NONE DETECTED Final   Amphetamines 03/28/2023 NONE DETECTED  NONE DETECTED Final   Tetrahydrocannabinol 03/28/2023 POSITIVE (A)  NONE DETECTED Final   Barbiturates 03/28/2023 NONE DETECTED  NONE DETECTED Final   Comment: (NOTE) DRUG SCREEN FOR MEDICAL PURPOSES ONLY.  IF CONFIRMATION IS NEEDED FOR ANY PURPOSE, NOTIFY LAB WITHIN 5 DAYS.  LOWEST DETECTABLE LIMITS FOR URINE DRUG SCREEN Drug Class                     Cutoff (ng/mL) Amphetamine and metabolites    1000 Barbiturate and metabolites    200 Benzodiazepine                 200 Opiates and metabolites        300 Cocaine and metabolites        300 THC                            50 Performed at [facility], [address] [place], [place] [address]   Admission on 03/13/2023, Discharged on 03/13/2023  Component Date Value Ref Range Status   Sodium 03/13/2023 135  135 - 145 mmol/L Final   Potassium 03/13/2023 3.9  3.5 - 5.1 mmol/L Final   Chloride 03/13/2023 99  98 - 111 mmol/L Final   CO2 03/13/2023 28  22 - 32 mmol/L Final   Glucose, Bld 03/13/2023 120 (H)  70 - 99 mg/dL Final   Glucose reference range applies only to samples taken after fasting for at least 8 hours.   BUN 03/13/2023 5 (L)  6 - 20 mg/dL Final   Creatinine, Ser 03/13/2023 0.70  0.61 - 1.24 mg/dL Final   Calcium 03/47/4259 8.7 (L)  8.9 - 10.3 mg/dL Final   Total Protein 56/38/7564 7.1  6.5 - 8.1 g/dL Final   Albumin 33/29/5188 4.3  3.5 - 5.0 g/dL Final   AST 41/66/0630 22  15 - 41 U/L Final   ALT 03/13/2023 11  0 - 44 U/L Final   Alkaline Phosphatase 03/13/2023 55  38 - 126 U/L Final   Total Bilirubin 03/13/2023 0.8  0.0 - 1.2 mg/dL Final   GFR, Estimated 03/13/2023 >60  >60 mL/min Final    Comment: (NOTE) Calculated using the CKD-EPI Creatinine Equation (2021)    Anion gap 03/13/2023 8  5 - 15 Final   Performed at [facility], [address], [place], [place] [address]   Alcohol, Ethyl (B) 03/13/2023 <10  <10 mg/dL Final   Comment: (NOTE) Lowest detectable limit for serum alcohol is 10 mg/dL.  For medical purposes only. Performed at [facility], [address], [place], [place] [address]    WBC 03/13/2023 10.9 (H)  4.0 - 10.5 K/uL Final   RBC 03/13/2023 4.81  4.22 - 5.81 MIL/uL Final   Hemoglobin 03/13/2023 14.8  13.0 - 17.0 g/dL Final   HCT 57/32/2025 43.5  39.0 - 52.0 % Final   MCV 03/13/2023 90.4  80.0 - 100.0 fL Final  MCH 03/13/2023 30.8  26.0 - 34.0 pg Final   MCHC 03/13/2023 34.0  30.0 - 36.0 g/dL Final   RDW 54/10/8117 12.2  11.5 - 15.5 % Final   Platelets 03/13/2023 288  150 - 400 K/uL Final   nRBC 03/13/2023 0.0  0.0 - 0.2 % Final   Performed at [facility], [address], [place], [place] [address]   Opiates 03/13/2023 NONE DETECTED  NONE DETECTED Final   Cocaine 03/13/2023 NONE DETECTED  NONE DETECTED Final   Benzodiazepines 03/13/2023 POSITIVE (A)  NONE DETECTED Final   Amphetamines 03/13/2023 NONE DETECTED  NONE DETECTED Final   Tetrahydrocannabinol 03/13/2023 POSITIVE (A)  NONE DETECTED Final   Barbiturates 03/13/2023 NONE DETECTED  NONE DETECTED Final   Comment: (NOTE) DRUG SCREEN FOR MEDICAL PURPOSES ONLY.  IF CONFIRMATION IS NEEDED FOR ANY PURPOSE, NOTIFY LAB WITHIN 5 DAYS.  LOWEST DETECTABLE LIMITS FOR URINE DRUG SCREEN Drug Class                     Cutoff (ng/mL) Amphetamine and metabolites    1000 Barbiturate and metabolites    200 Benzodiazepine                 200 Opiates and metabolites        300 Cocaine and metabolites        300 THC                            50 Performed at [facility], [address], [place], [place] [address]     Blood  Alcohol level:  Lab Results  Component Value Date   [facility] <15 06/02/2023   ETH <10 05/26/2023    Metabolic Disorder Labs: Lab Results  Component Value Date   HGBA1C 5.2 03/30/2023   MPG 102.54 03/30/2023   No results found for: "PROLACTIN" Lab Results  Component Value Date   CHOL 254 (H) 03/30/2023   TRIG 144 03/30/2023   HDL 65 03/30/2023   CHOLHDL 3.9 03/30/2023   VLDL 29 03/30/2023   LDLCALC 160 (H) 03/30/2023    Therapeutic Lab Levels: No results found for: "LITHIUM" No results found for: "VALPROATE" No results found for: "CBMZ"  Physical Findings   AUDIT    Flowsheet Row [facility]) [facility]-Admission (Discharged) [facility]  Alcohol Use Disorder Identification Test Final Score (AUDIT) 0 0      PHQ2-9    Flowsheet Row ED from 06/03/2023 in [facility] ED from 05/26/2023 in [facility] at [facility] ED from 03/13/2023 in [facility]  PHQ-2 Total Score 6 4 4   PHQ-9 Total Score 25 15 13       Flowsheet Row ED from 06/03/2023 in [facility] ED from 06/02/2023 in [facility] Emergency Department at [facility] Admission (Discharged) [facility]  C-SSRS RISK CATEGORY High Risk High Risk Low Risk        Musculoskeletal  Strength & Muscle Tone: within normal limits Gait & Station: normal Patient leans: N/A  Psychiatric Specialty Exam  Presentation  General Appearance:  Appropriate for Environment; Casual  Eye Contact: Fair  Speech: Clear and Coherent; Normal Rate  Speech Volume: Normal  Handedness: Right   Mood  and Affect  Mood: Depressed; Anxious  Affect: Congruent   Thought Process  Thought Processes: Coherent; Linear; Goal  Directed  Descriptions of Associations:Intact  Orientation:Full (Time, Place and Person)  Thought Content:Logical; WDL  Diagnosis of Schizophrenia or Schizoaffective disorder in past: No    Hallucinations:Hallucinations: None  Ideas of Reference:None  Suicidal Thoughts:Suicidal Thoughts: No  Homicidal Thoughts:Homicidal Thoughts: No   Sensorium  Memory: Remote Good  Judgment: Fair  Insight: Fair   Art therapist  Concentration: Good  Attention Span: Good  Recall: Good  Fund of Knowledge: Good  Language: Good   Psychomotor Activity  Psychomotor Activity: Psychomotor Activity: Normal   Assets  Assets: Communication Skills; Resilience   Sleep  Sleep: Sleep: Fair   No data recorded  Physical Exam  Physical Exam Constitutional:      Appearance: Normal appearance.  HENT:     Head: Normocephalic.  Eyes:     Extraocular Movements: Extraocular movements intact.  Pulmonary:     Effort: Pulmonary effort is normal.  Musculoskeletal:     Cervical back: Normal range of motion.  Neurological:     General: No focal deficit present.     Mental Status: He is alert and oriented to person, place, and time.  Psychiatric:        Behavior: Behavior normal.    Review of Systems  Constitutional:  Negative for chills and fever.  Gastrointestinal:  Negative for constipation, diarrhea, nausea and vomiting.  Musculoskeletal:  Negative for myalgias.  Psychiatric/Behavioral:  Negative for hallucinations and suicidal ideas.    Blood pressure 115/75, pulse 75, temperature 97.8 F (36.6 C), temperature source Oral, resp. rate 17, SpO2 99%. There is no height or weight on file to calculate BMI.  Treatment Plan Summary: Daily contact with patient to assess and evaluate symptoms and progress in treatment and Medication management.   Status: Voluntary   Alcohol use disorder, cocaine abuse - No withdrawal currently - Restarted gabapentin  for anxiety and  alcohol cravings - Residential rehab placement   Depression - Continue Effexor  150 mg - Continue Seroquel  100 mg   Dispo: residential placement likely tomorrow morning  Floyce Hutching, MD 06/07/2023 2:06 PM

## 2023-06-07 NOTE — ED Provider Notes (Signed)
 [facility] ASAP Discharge Summary  Date and Time: 06/08/2023  800AM Name: Michael [name]  MRN:  [id number]   Discharge Diagnoses:  Final diagnoses:  Alcohol use disorder  Other depression    Subjective: per H&P 4/27: The patient is a 48 year old male with a recent psychiatric hospitalization.  At that hospitalization he presented with suicidal thoughts and was diagnosed with major depressive disorder.  Soon after discharge, he presented to the emergency department reporting suicidal thoughts.  He was admitted to the facility based crisis for further treatment.   The patient feels he was doing fairly well in life until the COVID-19 pandemic.  He feels the pandemic led to significant problems in his life.  He talks about the breakdown of his family relationships.  He reports some connection to his 64 year old daughter, and he says he wants to be sober so that he can be involved in her life.  The patient reports frequent use of alcohol and cocaine.  He denies experiencing any withdrawal presently.  He denies history of severe withdrawal symptoms.  Stay Summary: patient was safely detoxed from alcohol and cocaine and was without signs or symptoms of withdrawal on discharge. During the course of patient's hospitalization, the 15-minute checks were adequate to ensure patient's safety. Patient did not exhibit erratic or aggressive behavior and was compliant with scheduled medication. Patient was recommended for outpatient psychiatry.  At the time of discharge patient is not reporting any acute suicidal/homicidal ideations/AVH, delusional thoughts or paranoia. Patient did not appear to be responding to any internal stimuli. Patient feels more confident about self-care & in managing their mental health problems. Patient currently denies any new issues or concerns. Education and supportive counseling provided throughout patient's hospital stay & upon discharge.   Upon discharge evaluation, the patient is in  euthymic mood. Patient denies any specific concerns. Patient slept well, appetite good, regular bowel movements. Patient denies any new physical complaints. Patient feels that the medications have been helpful & is in agreement to continue current treatment regimen as recommended. Patient was able to engage in safety planning including plan to return to [facility] or contact emergency services if patient feels unable to maintain their own safety or the safety of others. Patient had no further questions, comments, or concerns. Patient left [facility] with all personal belongings in no apparent distress. Transportation per safe transport to home was arranged for patient.  Total Time spent with patient: 20 minutes  Past Psychiatric History:  Prev Dx/Sx: MDD, Anxiety, Panic Disorder Current Psych Provider: na Home Meds (current): see above Previous Med Trials: Xanax , Mirtazapine , Effexor , Hydroxyzine . Therapy: Denies   Prior Psych Hospitalization: [facility], [facility]  Prior Self Harm: Denies Prior Violence: denies   Family Psych History: Depression Family Hx suicide: denies  Alcohol: Denies  Illicit drugs: Cannabis, Cocaine  Prescription drug abuse:  Denies Rehab hx: denies  Past Medical History: n/a  Social History: Developmental Hx: normal Educational Hx: GED Occupational Hx: Unemployed Legal Hx: denies Living Situation: homeless Spiritual Hx: denies Tobacco Cessation:  A prescription for an FDA-approved tobacco cessation medication was offered at discharge and the patient refused  Current Medications:  Current Facility-Administered Medications  Medication Dose Route Frequency Provider Last Rate Last Admin   acetaminophen  (TYLENOL ) tablet 650 mg  650 mg Oral Q6H PRN Motley-Mangrum, Jadeka A, PMHNP   650 mg at 06/06/23 0928   alum & mag hydroxide-simeth (MAALOX/MYLANTA) 200-200-20 MG/5ML suspension 30 mL  30 mL Oral Q4H PRN Motley-Mangrum, Jadeka A, PMHNP  benzocaine (ORAJEL) 10 % mucosal gel 1  Application  1 Application Mouth/Throat BID PRN Marilou Showman, MD   1 Application at 06/06/23 [phone]   haloperidol  (HALDOL ) tablet 5 mg  5 mg Oral TID PRN Motley-Mangrum, Jadeka A, PMHNP       And   diphenhydrAMINE  (BENADRYL ) capsule 50 mg  50 mg Oral TID PRN Motley-Mangrum, Jadeka A, PMHNP       haloperidol  lactate (HALDOL ) injection 5 mg  5 mg Intramuscular TID PRN Motley-Mangrum, Jadeka A, PMHNP       And   diphenhydrAMINE  (BENADRYL ) injection 50 mg  50 mg Intramuscular TID PRN Motley-Mangrum, Jadeka A, PMHNP       And   LORazepam  (ATIVAN ) injection 2 mg  2 mg Intramuscular TID PRN Motley-Mangrum, Jadeka A, PMHNP       haloperidol  lactate (HALDOL ) injection 10 mg  10 mg Intramuscular TID PRN Motley-Mangrum, Jadeka A, PMHNP       And   diphenhydrAMINE  (BENADRYL ) injection 50 mg  50 mg Intramuscular TID PRN Motley-Mangrum, Jadeka A, PMHNP       And   LORazepam  (ATIVAN ) injection 2 mg  2 mg Intramuscular TID PRN Motley-Mangrum, Jadeka A, PMHNP       gabapentin  (NEURONTIN ) capsule 300 mg  300 mg Oral TID Marilou Showman, MD   300 mg at 06/07/23 9604   hydrOXYzine  (ATARAX ) tablet 25 mg  25 mg Oral TID PRN Hoang, Daniela B, MD   25 mg at 06/06/23 2111   LORazepam  (ATIVAN ) tablet 1 mg  1 mg Oral Q6H PRN Marilou Showman, MD       magnesium  hydroxide (MILK OF MAGNESIA) suspension 30 mL  30 mL Oral Daily PRN Motley-Mangrum, Jadeka A, PMHNP       nicotine  (NICODERM CQ  - dosed in mg/24 hours) patch 21 mg  21 mg Transdermal Daily Marilou Showman, MD   21 mg at 06/07/23 5409   QUEtiapine  (SEROQUEL ) tablet 100 mg  100 mg Oral QHS Marilou Showman, MD   100 mg at 06/06/23 2111   traZODone  (DESYREL ) tablet 50 mg  50 mg Oral QHS PRN Motley-Mangrum, Jadeka A, PMHNP   50 mg at 06/06/23 2111   traZODone  (DESYREL ) tablet 50 mg  50 mg Oral QHS PRN Marilou Showman, MD   50 mg at 06/05/23 2118   venlafaxine  XR (EFFEXOR -XR) 24 hr capsule 150 mg  150 mg Oral Q breakfast Marilou Showman, MD   150 mg at 06/07/23 8119    Current Outpatient Medications  Medication Sig Dispense Refill   acetaminophen  (TYLENOL ) 500 MG tablet Take 500 mg by mouth every 6 (six) hours as needed for moderate pain (pain score 4-6).     benzocaine (ORAJEL) 10 % mucosal gel Use as directed 1 Application in the mouth or throat 2 (two) times daily as needed for mouth pain. 5.3 g 0   gabapentin  (NEURONTIN ) 300 MG capsule Take 1 capsule (300 mg total) by mouth 3 (three) times daily. 90 capsule 0   hydrOXYzine  (ATARAX ) 25 MG tablet Take 1 tablet (25 mg total) by mouth 3 (three) times daily as needed for anxiety. 30 tablet 0   [START ON 06/08/2023] nicotine  (NICODERM CQ  - DOSED IN MG/24 HOURS) 21 mg/24hr patch Place 1 patch (21 mg total) onto the skin daily. 28 patch 0   QUEtiapine  (SEROQUEL ) 100 MG tablet Take 1 tablet (100 mg total) by mouth at bedtime. 30 tablet 0   traZODone  (DESYREL ) 50 MG tablet Take 1 tablet (50 mg total)  by mouth at bedtime as needed for sleep. 30 tablet 0   [START ON 06/08/2023] venlafaxine  XR (EFFEXOR -XR) 150 MG 24 hr capsule Take 1 capsule (150 mg total) by mouth daily with breakfast. 30 capsule 0    PTA Medications:  Facility Ordered Medications  Medication   acetaminophen  (TYLENOL ) tablet 650 mg   alum & mag hydroxide-simeth (MAALOX/MYLANTA) 200-200-20 MG/5ML suspension 30 mL   magnesium  hydroxide (MILK OF MAGNESIA) suspension 30 mL   haloperidol  (HALDOL ) tablet 5 mg   And   diphenhydrAMINE  (BENADRYL ) capsule 50 mg   haloperidol  lactate (HALDOL ) injection 5 mg   And   diphenhydrAMINE  (BENADRYL ) injection 50 mg   And   LORazepam  (ATIVAN ) injection 2 mg   haloperidol  lactate (HALDOL ) injection 10 mg   And   diphenhydrAMINE  (BENADRYL ) injection 50 mg   And   LORazepam  (ATIVAN ) injection 2 mg   traZODone  (DESYREL ) tablet 50 mg   gabapentin  (NEURONTIN ) capsule 300 mg   venlafaxine  XR (EFFEXOR -XR) 24 hr capsule 150 mg   QUEtiapine  (SEROQUEL ) tablet 100 mg   traZODone  (DESYREL ) tablet 50 mg   nicotine   (NICODERM CQ  - dosed in mg/24 hours) patch 21 mg   LORazepam  (ATIVAN ) tablet 1 mg   benzocaine (ORAJEL) 10 % mucosal gel 1 Application   [COMPLETED] benzocaine (ORAJEL) 10 % mucosal gel 1 Application   hydrOXYzine  (ATARAX ) tablet 25 mg   PTA Medications  Medication Sig   acetaminophen  (TYLENOL ) 500 MG tablet Take 500 mg by mouth every 6 (six) hours as needed for moderate pain (pain score 4-6).   [START ON 06/08/2023] venlafaxine  XR (EFFEXOR -XR) 150 MG 24 hr capsule Take 1 capsule (150 mg total) by mouth daily with breakfast.   [START ON 06/08/2023] nicotine  (NICODERM CQ  - DOSED IN MG/24 HOURS) 21 mg/24hr patch Place 1 patch (21 mg total) onto the skin daily.   hydrOXYzine  (ATARAX ) 25 MG tablet Take 1 tablet (25 mg total) by mouth 3 (three) times daily as needed for anxiety.   QUEtiapine  (SEROQUEL ) 100 MG tablet Take 1 tablet (100 mg total) by mouth at bedtime.   traZODone  (DESYREL ) 50 MG tablet Take 1 tablet (50 mg total) by mouth at bedtime as needed for sleep.   gabapentin  (NEURONTIN ) 300 MG capsule Take 1 capsule (300 mg total) by mouth 3 (three) times daily.   benzocaine (ORAJEL) 10 % mucosal gel Use as directed 1 Application in the mouth or throat 2 (two) times daily as needed for mouth pain.       06/04/2023    1:30 PM 05/26/2023   12:30 PM 03/20/2023    9:16 AM  Depression screen PHQ 2/9  Decreased Interest 3 2 2   Down, Depressed, Hopeless 3 2 2   PHQ - 2 Score 6 4 4   Altered sleeping 3 1   Tired, decreased energy 3 2   Change in appetite 3 1   Feeling bad or failure about yourself  3 2   Trouble concentrating 3 1   Moving slowly or fidgety/restless 3 1   Suicidal thoughts 1 3   PHQ-9 Score 25 15   Difficult doing work/chores Very difficult      Flowsheet Row ED from 06/03/2023 in [facility] ED from 06/02/2023 in [facility] Emergency Department [facility]Discharged[facility]  400B  C-SSRS RISK CATEGORY High Risk High Risk Low Risk       Musculoskeletal  Strength & Muscle Tone:  within normal limits Gait & Station: normal Patient leans: N/A  Psychiatric Specialty Exam  Presentation  General Appearance:  Appropriate for Environment; Casual  Eye Contact: Fair  Speech: Clear and Coherent; Normal Rate  Speech Volume: Normal  Handedness: Right   Mood and Affect  Mood: Depressed; Anxious  Affect: Congruent   Thought Process  Thought Processes: Coherent; Linear; Goal Directed  Descriptions of Associations:Intact  Orientation:Full (Time, Place and Person)  Thought Content:Logical; WDL  Diagnosis of Schizophrenia or Schizoaffective disorder in past: No    Hallucinations:Hallucinations: None  Ideas of Reference:None  Suicidal Thoughts:Suicidal Thoughts: No  Homicidal Thoughts:Homicidal Thoughts: No   Sensorium  Memory: Remote Good  Judgment: Fair  Insight: Fair   Art therapist  Concentration: Good  Attention Span: Good  Recall: Good  Fund of Knowledge: Good  Language: Good   Psychomotor Activity  Psychomotor Activity: Psychomotor Activity: Normal   Assets  Assets: Communication Skills; Resilience   Sleep  Sleep: Sleep: Fair   No data recorded  Physical Exam  Physical Exam ROS Blood pressure 115/75, pulse 75, temperature 97.8 F (36.6 C), temperature source Oral, resp. rate 17, SpO2 99%. There is no height or weight on file to calculate BMI.  Demographic Factors:  Male, Caucasian, and Unemployed  Loss Factors: Financial problems/change in socioeconomic status  Historical Factors: Family history of mental illness or substance abuse  Risk Reduction Factors:   Sense of responsibility to family and Positive coping skills or problem solving skills  Continued Clinical Symptoms:  Alcohol/Substance Abuse/Dependencies  Cognitive Features That Contribute To Risk:  None    Suicide  Risk:  Minimal: No identifiable suicidal ideation.  Patients presenting with no risk factors but with morbid ruminations; may be classified as minimal risk based on the severity of the depressive symptoms  Plan Of Care/Follow-up recommendations:  Activity:  as tolerated Diet:  low salt, low fat  Prescriptions for new medications provided for the patient to bridge to follow up appointment. The patient was informed that refills for these prescriptions are generally not provided, and patient is encouraged to attend all follow up appointments to address medication refills and adjustments.   Today's discharge was reviewed with treatment team, and the team is in agreement that the patient is ready for discharge. The patient is was of the discharge plan for today and has been given opportunity to ask questions. At time of discharge, the patient does not vocalize any acute harm to self or others, is goal directed, able to advocate for self and organizational baseline.   At discharge, the patient is instructed to:  Take all medications as prescribed. Report any adverse effects and or reactions from the medicines to her outpatient provider promptly.  Do not engage in alcohol and/or illegal drug use while on prescription medicines.  In the event of worsening symptoms, patient is instructed to call the crisis hotline, 911 and or go to the nearest ED for appropriate evaluation and treatment of symptoms.  Follow-up with primary care provider for further care of medical issues, concerns and or health care needs. * Substance abuse follow up: it is recommended that you follow up with community support treatment, like AA/NA. It is also recommended that the patient attend 90 meetings in 90 days, otherwise known as "90 in 90"  Disposition: discharge to [facility], MD 06/07/2023, 4:13 PM

## 2023-06-07 NOTE — Discharge Instructions (Signed)
 Prescriptions for new medications provided for the patient to bridge to follow up appointment. The patient was informed that refills for these prescriptions are generally not provided, and patient is encouraged to attend all follow up appointments to address medication refills and adjustments.   Today's discharge was reviewed with treatment team, and the team is in agreement that the patient is ready for discharge. The patient is was of the discharge plan for today and has been given opportunity to ask questions. At time of discharge, the patient does not vocalize any acute harm to self or others, is goal directed, able to advocate for self and organizational baseline.   At discharge, the patient is instructed to:  Take all medications as prescribed. Report any adverse effects and or reactions from the medicines to her outpatient provider promptly.  Do not engage in alcohol and/or illegal drug use while on prescription medicines.  In the event of worsening symptoms, patient is instructed to call the crisis hotline, 911 and or go to the nearest ED for appropriate evaluation and treatment of symptoms.  Follow-up with primary care provider for further care of medical issues, concerns and or health care needs. * Substance abuse follow up: it is recommended that you follow up with community support treatment, like AA/NA. It is also recommended that the patient attend 90 meetings in 90 days, otherwise known as "90 in 90"

## 2023-06-07 NOTE — ED Notes (Signed)
 Patient is sleeping. Respirations equal and unlabored, skin warm and dry. No change in assessment or acuity. Routine safety checks conducted according to facility protocol. Will continue to monitor for safety.

## 2023-06-07 NOTE — ED Notes (Signed)
 Patient remains asleep in bed without issue or complaint.  Patient without distress.  Will monitor.

## 2023-06-07 NOTE — ED Notes (Signed)
 Pt is in the dayroom watching TV with peers. Pt denies SI/HI/AVH. Pt has no further complain.No acute distress noted. Will continue to monitor for safety and provide support.

## 2023-06-07 NOTE — Group Note (Signed)
 Group Topic: Social Support  Group Date: 06/07/2023 Start Time: 1030 End Time: 1110 Facilitators: Dennis Fitting, NT  Department: [facility]  Number of Participants: 5  Group Focus: problem solving and social skills Treatment Modality:  Psychoeducation Interventions utilized were problem solving and support Purpose: explore maladaptive thinking and improve communication skills  Name: Michael [name] Date of Birth: 1975/09/19  MR: [id number]    Level of Participation: active Quality of Participation: attentive, cooperative, engaged, motivated, and offered feedback Interactions with others: gave feedback Mood/Affect: appropriate, bright, and positive Triggers (if applicable): N/A Cognition: coherent/clear and goal directed Progress: Gaining insight Response: Patient discussed going to a treatment center in [place] upon discharge. 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

## 2023-06-07 NOTE — ED Notes (Signed)
 Patient is in the bedroom sleeping  NAD. Respirations are even and unlabored. Environment secured per policy. Will monitoring for safety

## 2023-06-07 NOTE — Group Note (Signed)
 Group Topic: Wellness  Group Date: 06/07/2023 Start Time: 1230 End Time: 1400 Facilitators: Arlan Belling, RN  Department: [facility]  Number of Participants: 9  Group Focus: community group Treatment Modality:  Skills Training Interventions utilized were clarification, group exercise, leisure development, patient education, problem solving, and story telling Purpose: enhance coping skills, explore maladaptive thinking, express feelings, express irrational fears, improve communication skills, increase insight, regain self-worth, and reinforce self-care  Name: Michael [name] Date of Birth: [birth year]  MR: [id number]    Level of Participation: active Quality of Participation: attentive, cooperative, and supportive Interactions with others: gave feedback Mood/Affect: appropriate and positive Triggers (if applicable):   Cognition: coherent/clear, goal directed, and logical Progress: Gaining insight Response:   Plan: follow-up needed  Patients Problems:  Patient Active Problem List   Diagnosis Date Noted   MDD (major depressive disorder), recurrent severe, without psychosis (HCC) 05/26/2023   MDD (major depressive disorder) 03/13/2023   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

## 2023-06-08 DIAGNOSIS — F1010 Alcohol abuse, uncomplicated: Secondary | ICD-10-CM | POA: Diagnosis not present

## 2023-06-08 DIAGNOSIS — R45851 Suicidal ideations: Secondary | ICD-10-CM | POA: Diagnosis not present

## 2023-06-08 DIAGNOSIS — F1410 Cocaine abuse, uncomplicated: Secondary | ICD-10-CM | POA: Diagnosis not present

## 2023-06-08 DIAGNOSIS — F329 Major depressive disorder, single episode, unspecified: Secondary | ICD-10-CM | POA: Diagnosis not present

## 2023-06-08 NOTE — ED Notes (Signed)
 Patient discharged to [facility] per MD order. After Visit Summary (AVS) printed and given to patient, as well as printed prescriptions. AVS reviewed with patient and all questions fully answered. Patient discharged in no acute distress, A& O x4 and ambulatory. Patient denied SI/HI, A/VH upon discharge. Patient verbalized understanding of all discharge instructions explained by staff, including follow up appointments, RX's and safety plan. Patient mood sad to leave but fair. Patient belongings returned to patient from locker #3 complete and intact. Patient escorted [place] via staff for transport to destination. Safety maintained.

## 2023-06-08 NOTE — ED Notes (Signed)
 Patient alert & oriented x4. Denies intent to harm self or others when asked. Denies A/VH. Patient denies any physical complaints when asked. No acute distress noted. Patient planned to discharge to [facility] this AM, Safe Transport has been called and patient is awaiting transportation to next location. Patient reports no questions at this time regarding discharge. Support and encouragement provided. Routine safety checks conducted per facility protocol. Encouraged patient to notify staff if any thoughts of harm towards self or others arise. Patient verbalizes understanding and agreement.

## 2023-06-08 NOTE — ED Notes (Signed)
 Patient is sleeping. Respirations equal and unlabored, skin warm and dry. No change in assessment or acuity. Routine safety checks conducted according to facility protocol. Will continue to monitor for safety.
